# Patient Record
Sex: Female | Born: 1975 | Race: Black or African American | Hispanic: No | Marital: Single | Smoking: Never smoker
Health system: Southern US, Community
[De-identification: ages and names within clinical notes are randomized; demographics above are authoritative.]

## PROBLEM LIST (undated history)

## (undated) ENCOUNTER — Inpatient Hospital Stay (HOSPITAL_COMMUNITY): Payer: Self-pay

## (undated) DIAGNOSIS — Z8742 Personal history of other diseases of the female genital tract: Secondary | ICD-10-CM

## (undated) DIAGNOSIS — F329 Major depressive disorder, single episode, unspecified: Secondary | ICD-10-CM

## (undated) DIAGNOSIS — D259 Leiomyoma of uterus, unspecified: Secondary | ICD-10-CM

## (undated) DIAGNOSIS — K589 Irritable bowel syndrome without diarrhea: Secondary | ICD-10-CM

## (undated) DIAGNOSIS — F419 Anxiety disorder, unspecified: Secondary | ICD-10-CM

## (undated) DIAGNOSIS — R011 Cardiac murmur, unspecified: Secondary | ICD-10-CM

## (undated) DIAGNOSIS — F32A Depression, unspecified: Secondary | ICD-10-CM

## (undated) DIAGNOSIS — K5090 Crohn's disease, unspecified, without complications: Secondary | ICD-10-CM

## (undated) DIAGNOSIS — G8929 Other chronic pain: Secondary | ICD-10-CM

## (undated) DIAGNOSIS — D649 Anemia, unspecified: Secondary | ICD-10-CM

## (undated) DIAGNOSIS — G589 Mononeuropathy, unspecified: Secondary | ICD-10-CM

## (undated) DIAGNOSIS — M549 Dorsalgia, unspecified: Secondary | ICD-10-CM

## (undated) DIAGNOSIS — L309 Dermatitis, unspecified: Secondary | ICD-10-CM

## (undated) DIAGNOSIS — S149XXA Injury of unspecified nerves of neck, initial encounter: Secondary | ICD-10-CM

## (undated) DIAGNOSIS — R519 Headache, unspecified: Secondary | ICD-10-CM

## (undated) DIAGNOSIS — M1990 Unspecified osteoarthritis, unspecified site: Secondary | ICD-10-CM

## (undated) DIAGNOSIS — J45909 Unspecified asthma, uncomplicated: Secondary | ICD-10-CM

## (undated) DIAGNOSIS — Z87442 Personal history of urinary calculi: Secondary | ICD-10-CM

## (undated) DIAGNOSIS — K219 Gastro-esophageal reflux disease without esophagitis: Secondary | ICD-10-CM

## (undated) DIAGNOSIS — R51 Headache: Secondary | ICD-10-CM

## (undated) DIAGNOSIS — I10 Essential (primary) hypertension: Secondary | ICD-10-CM

## (undated) HISTORY — PX: LEEP: SHX91

## (undated) HISTORY — PX: OTHER SURGICAL HISTORY: SHX169

## (undated) HISTORY — PX: BUNIONECTOMY: SHX129

## (undated) HISTORY — PX: COLONOSCOPY: SHX174

## (undated) HISTORY — PX: MENISCUS REPAIR: SHX5179

## (undated) HISTORY — PX: SHOULDER SURGERY: SHX246

## (undated) HISTORY — DX: Essential (primary) hypertension: I10

## (undated) HISTORY — PX: ABDOMINAL HYSTERECTOMY: SHX81

---

## 1997-11-24 ENCOUNTER — Encounter: Admission: RE | Admit: 1997-11-24 | Discharge: 1997-11-24 | Payer: Self-pay | Admitting: Obstetrics & Gynecology

## 1997-11-24 ENCOUNTER — Other Ambulatory Visit: Admission: RE | Admit: 1997-11-24 | Discharge: 1997-11-24 | Payer: Self-pay | Admitting: Obstetrics

## 2001-07-04 ENCOUNTER — Encounter: Admission: RE | Admit: 2001-07-04 | Discharge: 2001-07-04 | Payer: Self-pay | Admitting: *Deleted

## 2001-07-16 ENCOUNTER — Encounter: Admission: RE | Admit: 2001-07-16 | Discharge: 2001-07-16 | Payer: Self-pay | Admitting: *Deleted

## 2001-07-16 ENCOUNTER — Other Ambulatory Visit: Admission: RE | Admit: 2001-07-16 | Discharge: 2001-07-16 | Payer: Self-pay | Admitting: Obstetrics and Gynecology

## 2001-12-20 ENCOUNTER — Encounter: Admission: RE | Admit: 2001-12-20 | Discharge: 2001-12-20 | Payer: Self-pay | Admitting: *Deleted

## 2001-12-20 ENCOUNTER — Other Ambulatory Visit: Admission: RE | Admit: 2001-12-20 | Discharge: 2001-12-20 | Payer: Self-pay | Admitting: *Deleted

## 2002-01-05 ENCOUNTER — Emergency Department (HOSPITAL_COMMUNITY): Admission: EM | Admit: 2002-01-05 | Discharge: 2002-01-05 | Payer: Self-pay | Admitting: Emergency Medicine

## 2002-01-07 ENCOUNTER — Encounter: Admission: RE | Admit: 2002-01-07 | Discharge: 2002-01-07 | Payer: Self-pay | Admitting: *Deleted

## 2005-02-15 ENCOUNTER — Inpatient Hospital Stay (HOSPITAL_COMMUNITY): Admission: RE | Admit: 2005-02-15 | Discharge: 2005-02-17 | Payer: Self-pay | Admitting: Obstetrics

## 2005-02-15 ENCOUNTER — Encounter (INDEPENDENT_AMBULATORY_CARE_PROVIDER_SITE_OTHER): Payer: Self-pay | Admitting: Specialist

## 2005-02-15 HISTORY — PX: MYOMECTOMY ABDOMINAL APPROACH: SUR870

## 2005-02-28 ENCOUNTER — Inpatient Hospital Stay (HOSPITAL_COMMUNITY): Admission: AD | Admit: 2005-02-28 | Discharge: 2005-02-28 | Payer: Self-pay | Admitting: Obstetrics

## 2008-02-18 DIAGNOSIS — G8929 Other chronic pain: Secondary | ICD-10-CM | POA: Insufficient documentation

## 2008-12-16 ENCOUNTER — Emergency Department (HOSPITAL_BASED_OUTPATIENT_CLINIC_OR_DEPARTMENT_OTHER): Admission: EM | Admit: 2008-12-16 | Discharge: 2008-12-16 | Payer: Self-pay | Admitting: Emergency Medicine

## 2008-12-16 ENCOUNTER — Ambulatory Visit: Payer: Self-pay | Admitting: Interventional Radiology

## 2009-02-11 ENCOUNTER — Emergency Department (HOSPITAL_BASED_OUTPATIENT_CLINIC_OR_DEPARTMENT_OTHER): Admission: EM | Admit: 2009-02-11 | Discharge: 2009-02-11 | Payer: Self-pay | Admitting: Emergency Medicine

## 2009-03-16 DIAGNOSIS — K589 Irritable bowel syndrome without diarrhea: Secondary | ICD-10-CM | POA: Insufficient documentation

## 2009-12-19 ENCOUNTER — Emergency Department (HOSPITAL_BASED_OUTPATIENT_CLINIC_OR_DEPARTMENT_OTHER): Admission: EM | Admit: 2009-12-19 | Discharge: 2009-12-19 | Payer: Self-pay | Admitting: Emergency Medicine

## 2009-12-20 ENCOUNTER — Ambulatory Visit: Payer: Self-pay | Admitting: Diagnostic Radiology

## 2009-12-20 ENCOUNTER — Emergency Department (HOSPITAL_BASED_OUTPATIENT_CLINIC_OR_DEPARTMENT_OTHER): Admission: EM | Admit: 2009-12-20 | Discharge: 2009-12-20 | Payer: Self-pay | Admitting: Emergency Medicine

## 2010-06-16 HISTORY — PX: ROBOT ASSISTED MYOMECTOMY: SHX5142

## 2010-06-30 LAB — CBC
HCT: 28.3 % — ABNORMAL LOW (ref 36.0–46.0)
HCT: 29.4 % — ABNORMAL LOW (ref 36.0–46.0)
Hemoglobin: 9.2 g/dL — ABNORMAL LOW (ref 12.0–15.0)
Hemoglobin: 9.4 g/dL — ABNORMAL LOW (ref 12.0–15.0)
MCH: 24 pg — ABNORMAL LOW (ref 26.0–34.0)
MCH: 24.3 pg — ABNORMAL LOW (ref 26.0–34.0)
MCHC: 32 g/dL (ref 30.0–36.0)
MCHC: 32.6 g/dL (ref 30.0–36.0)
MCV: 74.5 fL — ABNORMAL LOW (ref 78.0–100.0)
MCV: 75.2 fL — ABNORMAL LOW (ref 78.0–100.0)
Platelets: 294 10*3/uL (ref 150–400)
Platelets: 305 10*3/uL (ref 150–400)
RBC: 3.8 MIL/uL — ABNORMAL LOW (ref 3.87–5.11)
RBC: 3.9 MIL/uL (ref 3.87–5.11)
RDW: 14.5 % (ref 11.5–15.5)
RDW: 14.6 % (ref 11.5–15.5)
WBC: 8.1 10*3/uL (ref 4.0–10.5)
WBC: 9.5 10*3/uL (ref 4.0–10.5)

## 2010-06-30 LAB — DIFFERENTIAL
Basophils Absolute: 0 10*3/uL (ref 0.0–0.1)
Basophils Absolute: 0.1 10*3/uL (ref 0.0–0.1)
Basophils Relative: 0 % (ref 0–1)
Basophils Relative: 1 % (ref 0–1)
Eosinophils Absolute: 0.1 10*3/uL (ref 0.0–0.7)
Eosinophils Absolute: 0.2 10*3/uL (ref 0.0–0.7)
Eosinophils Relative: 1 % (ref 0–5)
Eosinophils Relative: 2 % (ref 0–5)
Lymphocytes Relative: 31 % (ref 12–46)
Lymphocytes Relative: 33 % (ref 12–46)
Lymphs Abs: 2.5 10*3/uL (ref 0.7–4.0)
Lymphs Abs: 3.2 10*3/uL (ref 0.7–4.0)
Monocytes Absolute: 0.7 10*3/uL (ref 0.1–1.0)
Monocytes Absolute: 0.9 10*3/uL (ref 0.1–1.0)
Monocytes Relative: 9 % (ref 3–12)
Monocytes Relative: 9 % (ref 3–12)
Neutro Abs: 4.6 10*3/uL (ref 1.7–7.7)
Neutro Abs: 5.3 10*3/uL (ref 1.7–7.7)
Neutrophils Relative %: 56 % (ref 43–77)
Neutrophils Relative %: 57 % (ref 43–77)

## 2010-06-30 LAB — URINALYSIS, ROUTINE W REFLEX MICROSCOPIC
Bilirubin Urine: NEGATIVE
Bilirubin Urine: NEGATIVE
Glucose, UA: NEGATIVE mg/dL
Glucose, UA: NEGATIVE mg/dL
Hgb urine dipstick: NEGATIVE
Ketones, ur: NEGATIVE mg/dL
Ketones, ur: NEGATIVE mg/dL
Nitrite: NEGATIVE
Nitrite: NEGATIVE
Protein, ur: 30 mg/dL — AB
Protein, ur: NEGATIVE mg/dL
Specific Gravity, Urine: 1.015 (ref 1.005–1.030)
Specific Gravity, Urine: 1.028 (ref 1.005–1.030)
Urobilinogen, UA: 0.2 mg/dL (ref 0.0–1.0)
Urobilinogen, UA: 0.2 mg/dL (ref 0.0–1.0)
pH: 6 (ref 5.0–8.0)
pH: 6.5 (ref 5.0–8.0)

## 2010-06-30 LAB — COMPREHENSIVE METABOLIC PANEL
ALT: 16 U/L (ref 0–35)
AST: 20 U/L (ref 0–37)
Albumin: 3.9 g/dL (ref 3.5–5.2)
Alkaline Phosphatase: 87 U/L (ref 39–117)
BUN: 7 mg/dL (ref 6–23)
CO2: 28 mEq/L (ref 19–32)
Calcium: 8.8 mg/dL (ref 8.4–10.5)
Chloride: 104 mEq/L (ref 96–112)
Creatinine, Ser: 0.8 mg/dL (ref 0.4–1.2)
GFR calc Af Amer: >60 mL/min (ref >60)
GFR calc non Af Amer: >60 mL/min (ref >60)
Glucose, Bld: 96 mg/dL (ref 70–99)
Potassium: 3.3 mEq/L — ABNORMAL LOW (ref 3.5–5.1)
Sodium: 141 mEq/L (ref 135–145)
Total Bilirubin: 0.5 mg/dL (ref 0.3–1.2)
Total Protein: 7.8 g/dL (ref 6.0–8.3)

## 2010-06-30 LAB — URINE MICROSCOPIC-ADD ON

## 2010-06-30 LAB — BASIC METABOLIC PANEL
BUN: 7 mg/dL (ref 6–23)
CO2: 25 mEq/L (ref 19–32)
Calcium: 8.8 mg/dL (ref 8.4–10.5)
Chloride: 104 mEq/L (ref 96–112)
Creatinine, Ser: 0.7 mg/dL (ref 0.4–1.2)
GFR calc Af Amer: >60 mL/min (ref >60)
GFR calc non Af Amer: >60 mL/min (ref >60)
Glucose, Bld: 88 mg/dL (ref 70–99)
Potassium: 3.5 mEq/L (ref 3.5–5.1)
Sodium: 142 mEq/L (ref 135–145)

## 2010-06-30 LAB — WET PREP, GENITAL
Clue Cells Wet Prep HPF POC: NONE SEEN
Trich, Wet Prep: NONE SEEN
Yeast Wet Prep HPF POC: NONE SEEN

## 2010-06-30 LAB — URINE CULTURE
Colony Count: 80000
Culture  Setup Time: 201109060046

## 2010-06-30 LAB — PREGNANCY, URINE
Preg Test, Ur: NEGATIVE
Preg Test, Ur: NEGATIVE

## 2010-06-30 LAB — LIPASE, BLOOD: Lipase: 33 U/L (ref 23–300)

## 2010-06-30 LAB — GC/CHLAMYDIA PROBE AMP, GENITAL
Chlamydia, DNA Probe: NEGATIVE
GC Probe Amp, Genital: NEGATIVE

## 2010-07-21 LAB — URINALYSIS, ROUTINE W REFLEX MICROSCOPIC
Bilirubin Urine: NEGATIVE
Glucose, UA: NEGATIVE mg/dL
Hgb urine dipstick: NEGATIVE
Ketones, ur: NEGATIVE mg/dL
Nitrite: NEGATIVE
Protein, ur: NEGATIVE mg/dL
Specific Gravity, Urine: 1.019 (ref 1.005–1.030)
Urobilinogen, UA: 0.2 mg/dL (ref 0.0–1.0)
pH: 6 (ref 5.0–8.0)

## 2010-07-21 LAB — BASIC METABOLIC PANEL
BUN: 7 mg/dL (ref 6–23)
CO2: 27 mEq/L (ref 19–32)
Calcium: 8.9 mg/dL (ref 8.4–10.5)
Chloride: 103 mEq/L (ref 96–112)
Creatinine, Ser: 0.7 mg/dL (ref 0.4–1.2)
GFR calc Af Amer: >60 mL/min (ref >60)
GFR calc non Af Amer: >60 mL/min (ref >60)
Glucose, Bld: 111 mg/dL — ABNORMAL HIGH (ref 70–99)
Potassium: 3.2 mEq/L — ABNORMAL LOW (ref 3.5–5.1)
Sodium: 140 mEq/L (ref 135–145)

## 2010-07-21 LAB — DIFFERENTIAL
Basophils Absolute: 0 10*3/uL (ref 0.0–0.1)
Basophils Relative: 1 % (ref 0–1)
Eosinophils Absolute: 0.2 10*3/uL (ref 0.0–0.7)
Eosinophils Relative: 3 % (ref 0–5)
Lymphocytes Relative: 37 % (ref 12–46)
Lymphs Abs: 2.6 10*3/uL (ref 0.7–4.0)
Monocytes Absolute: 0.5 10*3/uL (ref 0.1–1.0)
Monocytes Relative: 7 % (ref 3–12)
Neutro Abs: 3.7 10*3/uL (ref 1.7–7.7)
Neutrophils Relative %: 52 % (ref 43–77)

## 2010-07-21 LAB — CBC
HCT: 34.2 % — ABNORMAL LOW (ref 36.0–46.0)
Hemoglobin: 11.3 g/dL — ABNORMAL LOW (ref 12.0–15.0)
MCHC: 33 g/dL (ref 30.0–36.0)
MCV: 80.5 fL (ref 78.0–100.0)
Platelets: 293 10*3/uL (ref 150–400)
RBC: 4.25 MIL/uL (ref 3.87–5.11)
RDW: 14.1 % (ref 11.5–15.5)
WBC: 7 10*3/uL (ref 4.0–10.5)

## 2010-07-21 LAB — PREGNANCY, URINE: Preg Test, Ur: NEGATIVE

## 2010-07-21 LAB — GC/CHLAMYDIA PROBE AMP, GENITAL: GC Probe Amp, Genital: NEGATIVE

## 2010-07-22 LAB — CBC
HCT: 34.4 % — ABNORMAL LOW (ref 36.0–46.0)
Hemoglobin: 11.7 g/dL — ABNORMAL LOW (ref 12.0–15.0)
MCV: 79.8 fL (ref 78.0–100.0)
RBC: 4.31 MIL/uL (ref 3.87–5.11)
WBC: 9.4 10*3/uL (ref 4.0–10.5)

## 2010-09-02 NOTE — Op Note (Signed)
NAME:  Colleen Baker, SCALF NO.:  [id number]   MEDICAL RECORD NO.:  [id number]          PATIENT TYPE:  INP   LOCATION:  9399                          FACILITY:  [facility]   PHYSICIAN:  Kathreen Cosier, M.D.DATE OF BIRTH:  [birth year]   DATE OF PROCEDURE:  02/15/2005  DATE OF DISCHARGE:                                 OPERATIVE REPORT   PREOPERATIVE DIAGNOSIS:  Myoma uteri.   OPERATION/PROCEDURE:  Multiple myomectomies.   SURGEON:  Kathreen Cosier, M.D.   FIRST ASSISTANT:  Charles A. Clearance Coots, M.D.   DESCRIPTION OF PROCEDURE:  The patient was placed in the supine position.  General anesthesia was administered.  The abdomen prepped and draped.  Bladder emptied with the Foley catheter.  A transverse suprapubic incision  made and carried down to the rectus fascia. The fascia was cleaned and  incised the length of the incision.  Rectus muscles were retracted  laterally.  Peritoneum was incised longitudinally.  The uterus was delivered  from the abdominal cavity.  She had 20-week size uterus with multiple  myomas. There was a large fundal myoma, two large posterior ones and one  anterior myoma.  Incision was made across the top of the fundus from the top  of the bladder posteriorly.  Then using blunt and sharp dissection, her  myomas were removed through that incision.  She had one large fundal 10 cm  myoma and posteriorly four smaller myomas removed.  The excess tissue was  closed with interrupted sutures of #1 chromic closing on the dead space and  the tubes and ovaries appeared to be normal and the cavity of the uterus was  dilated.  Hemostasis was achieved using interrupted sutures of #1 chromic  across the top of the uterus.  There was an anterior lower segment myoma  which was removed in the uterine manner and hemostasis was achieved with  interrupted sutures of #1 chromic.  Interceed was then placed over the  uterus where any incisions had been made.  The blood loss  was 600 mL.  The  abdomen was closed in layers.  Peritoneum with continuous suture of 0  chromic, fascia with continuous suture of 0 Dexon and skin closed with  subcuticular stitch of 4-0 Monocryl.  Blood loss 600 mL.  The patient  tolerated the procedure well and was taken to the recovery room in good  condition.           ______________________________  Kathreen Cosier, M.D.     BAM/MEDQ  D:  02/15/2005  T:  02/15/2005  Job:  [id number]

## 2010-09-02 NOTE — Discharge Summary (Signed)
NAME:  MINIE, ROADCAP NO.:  [id number]   MEDICAL RECORD NO.:  [id number]          PATIENT TYPE:  INP   LOCATION:  9317                          FACILITY:  [facility]   PHYSICIAN:  Kathreen Cosier, M.D.DATE OF BIRTH:  February 03, 1976   DATE OF ADMISSION:  02/15/2005  DATE OF DISCHARGE:  02/17/2005                                 DISCHARGE SUMMARY   HISTORY AND HOSPITAL COURSE:  The patient is a 35 year old gravida 1, para 0  female with 20 week size myoma's and anemia secondary to hypermenorrhea.  She was admitted on February 15, 2005 and had multiple myomectomy.  Blood  loss was approximately 600 cc.  On admission her hemoglobin was 10.5, white  count 6.4.  Pro Time and PTT normal.  Sodium 141, potassium 3.8, chloride  101.  Urinalysis was negative.  She was O positive.  Postoperatively her  hemoglobin was 6.1.  she was asymptomatic and started on ferrous sulfate 325  p.o. daily.  The patient did well and was discharged on the second  postoperative day, ambulatory and on a regular diet.  She is to see me in  follow up in two weeks.   DISCHARGE MEDICATIONS:  1.  Tylox one to two every three to four hours PRN.  2.  Ferrous sulfate 325 mg p.o. daily.           ______________________________  Kathreen Cosier, M.D.     BAM/MEDQ  D:  02/17/2005  T:  02/17/2005  Job:  [id number]

## 2011-02-27 LAB — OB RESULTS CONSOLE RPR: RPR: NONREACTIVE

## 2011-02-27 LAB — OB RESULTS CONSOLE HIV ANTIBODY (ROUTINE TESTING): HIV: NONREACTIVE

## 2011-02-27 LAB — OB RESULTS CONSOLE ABO/RH: RH Type: POSITIVE

## 2011-08-13 ENCOUNTER — Encounter (HOSPITAL_COMMUNITY): Payer: Self-pay

## 2011-08-15 ENCOUNTER — Other Ambulatory Visit: Payer: Self-pay | Admitting: Obstetrics and Gynecology

## 2011-08-18 ENCOUNTER — Encounter (HOSPITAL_COMMUNITY): Payer: Self-pay | Admitting: *Deleted

## 2011-08-18 ENCOUNTER — Inpatient Hospital Stay (HOSPITAL_COMMUNITY)
Admission: AD | Admit: 2011-08-18 | Discharge: 2011-08-18 | Disposition: A | Discharge status: Home / Self Care | Payer: Managed Care, Other (non HMO) | Source: Ambulatory Visit | Attending: Obstetrics and Gynecology | Admitting: Obstetrics and Gynecology

## 2011-08-18 DIAGNOSIS — O99891 Other specified diseases and conditions complicating pregnancy: Secondary | ICD-10-CM | POA: Insufficient documentation

## 2011-08-18 DIAGNOSIS — R030 Elevated blood-pressure reading, without diagnosis of hypertension: Secondary | ICD-10-CM | POA: Insufficient documentation

## 2011-08-18 HISTORY — DX: Anemia, unspecified: D64.9

## 2011-08-18 HISTORY — DX: Irritable bowel syndrome, unspecified: K58.9

## 2011-08-18 LAB — URINALYSIS, ROUTINE W REFLEX MICROSCOPIC
Bilirubin Urine: NEGATIVE
Glucose, UA: NEGATIVE mg/dL
Hgb urine dipstick: NEGATIVE
Ketones, ur: NEGATIVE mg/dL
Leukocytes, UA: NEGATIVE
Nitrite: NEGATIVE
Protein, ur: NEGATIVE mg/dL
Specific Gravity, Urine: 1.02 (ref 1.005–1.030)
Urobilinogen, UA: 0.2 mg/dL (ref 0.0–1.0)
pH: 6.5 (ref 5.0–8.0)

## 2011-08-18 LAB — COMPREHENSIVE METABOLIC PANEL
ALT: 11 U/L (ref 0–35)
AST: 15 U/L (ref 0–37)
Albumin: 2.1 g/dL — ABNORMAL LOW (ref 3.5–5.2)
Alkaline Phosphatase: 116 U/L (ref 39–117)
BUN: 6 mg/dL (ref 6–23)
CO2: 20 mEq/L (ref 19–32)
Calcium: 8.9 mg/dL (ref 8.4–10.5)
Chloride: 106 mEq/L (ref 96–112)
Creatinine, Ser: 0.63 mg/dL (ref 0.50–1.10)
GFR calc Af Amer: >90 mL/min (ref >90)
GFR calc non Af Amer: >90 mL/min (ref >90)
Glucose, Bld: 115 mg/dL — ABNORMAL HIGH (ref 70–99)
Potassium: 3.8 mEq/L (ref 3.5–5.1)
Sodium: 136 mEq/L (ref 135–145)
Total Bilirubin: 0.2 mg/dL — ABNORMAL LOW (ref 0.3–1.2)
Total Protein: 5.8 g/dL — ABNORMAL LOW (ref 6.0–8.3)

## 2011-08-18 LAB — CBC
Hemoglobin: 10.8 g/dL — ABNORMAL LOW (ref 12.0–15.0)
MCH: 26.9 pg (ref 26.0–34.0)
MCHC: 33.2 g/dL (ref 30.0–36.0)

## 2011-08-18 LAB — URIC ACID: Uric Acid, Serum: 4.2 mg/dL (ref 2.4–7.0)

## 2011-08-18 MED ORDER — LABETALOL HCL 100 MG PO TABS
100.0000 mg | ORAL_TABLET | Freq: Once | ORAL | Status: AC
  Administered 2011-08-18: 100 mg via ORAL
  Filled 2011-08-18: qty 1

## 2011-08-18 NOTE — Discharge Instructions (Signed)
Hypertension During Pregnancy Hypertension is also called high blood pressure. It can occur at any time in life and during pregnancy. When you have hypertension, there is extra pressure inside your blood vessels that carry blood from the heart to the rest of your body (arteries). Hypertension during pregnancy can cause problems for you and your baby. Your baby might not weigh as much as it should at birth or might be born early (premature). Very bad cases of hypertension during pregnancy can be life-threatening.  There are different types of hypertension during pregnancy.   Chronic hypertension. This happens when a woman has hypertension before pregnancy and it continues during pregnancy.   Gestational hypertension. This is when hypertension develops during pregnancy.   Preeclampsia or toxemia of pregnancy. This is a very serious type of hypertension that develops only during pregnancy. It is a disease that affects the whole body (systemic) and can be very dangerous for both mother and baby.   Gestational hypertension and preeclampsia usually go away after your baby is born. Blood pressure generally stabilizes within 6 weeks. Women who have hypertension during pregnancy have a greater chance of developing hypertension later in life or with future pregnancies. UNDERSTANDING BLOOD PRESSURE Blood pressure moves blood in your body. Sometimes, the force that moves the blood becomes too strong.  A blood pressure reading is given in 2 numbers and looks like a fraction.   The top number is called the systolic pressure. When your heart beats, it forces more blood to flow through the arteries. Pressure inside the arteries goes up.   The bottom number is the diastolic pressure. Pressure goes down between beats. That is when the heart is resting.   You may have hypertension if:   Your systolic blood pressure is above 140.   Your diastolic pressure is above 90.  RISK FACTORS Some factors make you more  likely to develop hypertension during pregnancy. Risk factors include:  Having hypertension before pregnancy.   Having hypertension during a previous pregnancy.   Being overweight.   Being older than 40.   Being pregnant with more than 1 baby (multiples).   Having diabetes or kidney problems.  SYMPTOMS Chronic and gestational hypertension may not cause symptoms. Preeclampsia has symptoms, which may include:  Increased protein in your urine. Your caregiver will check for this at every prenatal visit.   Swelling of your hands and face.   Rapid weight gain.   Headaches.   Visual changes.   Being bothered by light.   Abdominal pain, especially in the right upper area.   Chest pain.   Shortness of breath.   Increased reflexes.   Seizures. Seizures occur with a more severe form of preeclampsia, called eclampsia.  DIAGNOSIS   You may be diagnosed with hypertension during pregnancy during a regular prenatal exam. At each visit, tests may include:   Blood pressure checks.   A urine test to check for protein in your urine.   The type of hypertension you are diagnosed with depends on when you developed it. It also depends on your specific blood pressure reading.   Developing hypertension before 20 weeks of pregnancy is consistent with chronic hypertension.   Developing hypertension after 20 weeks of pregnancy is consistent with gestational hypertension.   Hypertension with increased urinary protein is diagnosed as preeclampsia.   Blood pressure measurements that stay above 160 systolic or 110 diastolic are a sign of severe preeclampsia.  TREATMENT Treatment for hypertension during pregnancy varies. Treatment depends on   the type of hypertension and how serious it is.  If you take medicine for chronic hypertension, you may need to switch medicines.   Drugs called ACE inhibitors should not be taken during pregnancy.   Low-dose aspirin may be suggested for women who have  risk factors for preeclampsia.   If you have gestational hypertension, you may need to take a blood pressure medicine that is safe during pregnancy. Your caregiver will recommend the appropriate medicine.   If you have severe preeclampsia, you may need to be in the hospital. Caregivers will watch you and the baby very closely. You also may need to take medicine (magnesium sulfate) to prevent seizures and lower blood pressure.   Sometimes an early delivery is needed. This may be the case if the condition worsens. It would be done to protect you and the baby. The only cure for preeclampsia is delivery.  HOME CARE INSTRUCTIONS  Schedule and keep all of your regular prenatal care.   Follow your caregiver's instructions for taking medicines. Tell your caregiver about all medicines you take. This includes over-the-counter medicines.   Eat as little salt as possible.   Get regular exercise.   Do not drink alcohol.   Do not use tobacco products.   Do not drink products with caffeine.   Lie on your left side when resting.   Tell your doctor if you have any preeclampsia symptoms.  SEEK IMMEDIATE MEDICAL CARE IF:  You have severe abdominal pain.   You have sudden swelling in the hands, ankles, or face.   You gain 4 pounds (1.8 kg) or more in 1 week.   You vomit repeatedly.   You have vaginal bleeding.   You do not feel the baby moving as much.   You have a headache.   You have blurred or double vision.   You have muscle twitching or spasms.   You have shortness of breath.   You have blue fingernails and lips.   You have blood in your urine.  MAKE SURE YOU:  Understand these instructions.   Will watch your condition.   Will get help right away if you are not doing well.  Document Released: 12/20/2010 Document Revised: 03/23/2011 Document Reviewed: 12/20/2010 [employer] Patient Information 2012 [employer].24-Hour Urine Collection HOME CARE  When you get up in the  morning on the day you do this test, pee (urinate) in the toilet and flush. Make a note of the time. This will be your start time on the day of collection and the end time on the next morning.   From then on, save all your pee (urine) in the plastic jug that was given to you.   You should stop collecting your pee 24 hours after you started.   If the plastic jug that is given to you already has liquid in it, that is okay. Do not throw out the liquid or rinse out the jug. Some tests need the liquid to be added to your pee.   Keep your plastic jug cool (in an ice chest or the refrigerator) during the test.   When the 24 hours is over, bring your plastic jug to the clinic lab. Keep the jug cool (in an ice chest) while you are bringing it to the lab.  Document Released: 06/30/2008 Document Revised: 03/23/2011 Document Reviewed: 06/30/2008 [employer] Patient Information 2012 [employer]. 

## 2011-08-18 NOTE — MAU Note (Signed)
Labetalol 200mg  BID po called to [facility] and [place]. [phone]. No refills. Pt understands to take as directed

## 2011-08-18 NOTE — MAU Note (Signed)
Sent from office today with elevated BP

## 2011-08-18 NOTE — Progress Notes (Signed)
Written and verbal d/c instructions given and understanding voiced. Understands how to obtain 24hr urine and to return Sat pm. To keep urine on ice. To start Labetalol tomorrow am as directed and cont. Aldomet. Call office Monday for appt.

## 2011-08-18 NOTE — Progress Notes (Signed)
Dr Henderson Cloud notified of pt's B/Ps after taking Labetalol. Pt stable for d/c home

## 2011-08-18 NOTE — H&P (Signed)
36 y.o. G2P0 at 18 3/7 comes in c/o elevated blood pressures in the office.  She was in office for a regular visit and NST for chronic hypertension.  Her BP in the office was 180/100 and 170/100.  She otherwise has good fetal movement and no bleeding.  No past medical history on file. No past surgical history on file.  OB History    No data available      History   Social History  . Marital Status: Single    Spouse Name: N/A    Number of Children: N/A  . Years of Education: N/A   Occupational History  . Not on file.   Social History Main Topics  . Smoking status: Not on file  . Smokeless tobacco: Not on file  . Alcohol Use: Not on file  . Drug Use: Not on file  . Sexually Active: Not on file   Other Topics Concern  . Not on file   Social History Narrative  . No narrative on file   Aciphex   Prenatal Course:  Late gestation transfer from [place].  CHTN has been controlled with Aldomet TID.  She has no diabetes and is scheduled for a C/S on 5-16 secondary myomectomies times two.  There were no vitals filed for this visit.   Lungs/Cor:  NAD Abdomen:  soft, gravid Ex:  no cords, erythema SVE:  deferred FHTs: P- just arrived. Toco:  P   A/P   Chronic HTN at term.  Hx myomectomies, needs C/S.  R/O superimposed preeclampsia with PIH labs and urine.  GBS neg.  Give dose of labetalol 100 mg now.    Colleen Baker A

## 2011-08-18 NOTE — Progress Notes (Signed)
States h/a is not as bad as earlier today

## 2011-08-20 ENCOUNTER — Inpatient Hospital Stay (HOSPITAL_COMMUNITY)
Admission: AD | Admit: 2011-08-20 | Discharge: 2011-08-20 | Disposition: A | Discharge status: Home / Self Care | Payer: Managed Care, Other (non HMO) | Source: Ambulatory Visit | Attending: Obstetrics and Gynecology | Admitting: Obstetrics and Gynecology

## 2011-08-20 DIAGNOSIS — O99891 Other specified diseases and conditions complicating pregnancy: Secondary | ICD-10-CM | POA: Insufficient documentation

## 2011-08-20 DIAGNOSIS — R030 Elevated blood-pressure reading, without diagnosis of hypertension: Secondary | ICD-10-CM | POA: Insufficient documentation

## 2011-08-20 NOTE — Progress Notes (Signed)
Notified Dr. Henderson Cloud of b/p. Pt just here for b/p check and drop off urine. Order to let pt go home.

## 2011-08-20 NOTE — MAU Note (Signed)
Pt here for b/p check and drop off urine specimine

## 2011-08-28 ENCOUNTER — Encounter (HOSPITAL_COMMUNITY)
Admission: RE | Admit: 2011-08-28 | Discharge: 2011-08-28 | Disposition: A | Discharge status: Home / Self Care | Payer: Managed Care, Other (non HMO) | Source: Ambulatory Visit | Attending: Obstetrics & Gynecology | Admitting: Obstetrics & Gynecology

## 2011-08-28 ENCOUNTER — Encounter (HOSPITAL_COMMUNITY): Payer: Self-pay

## 2011-08-28 LAB — COMPREHENSIVE METABOLIC PANEL
ALT: 14 U/L (ref 0–35)
AST: 16 U/L (ref 0–37)
Albumin: 2.3 g/dL — ABNORMAL LOW (ref 3.5–5.2)
Alkaline Phosphatase: 129 U/L — ABNORMAL HIGH (ref 39–117)
BUN: 6 mg/dL (ref 6–23)
Potassium: 3.7 mEq/L (ref 3.5–5.1)
Sodium: 137 mEq/L (ref 135–145)
Total Protein: 6.2 g/dL (ref 6.0–8.3)

## 2011-08-28 LAB — CBC
MCHC: 32.5 g/dL (ref 30.0–36.0)
Platelets: 138 10*3/uL — ABNORMAL LOW (ref 150–400)
RDW: 14.9 % (ref 11.5–15.5)

## 2011-08-28 LAB — SURGICAL PCR SCREEN
MRSA, PCR: NEGATIVE
Staphylococcus aureus: NEGATIVE

## 2011-08-28 LAB — RPR: RPR Ser Ql: NONREACTIVE

## 2011-08-28 NOTE — Patient Instructions (Addendum)
YOUR PROCEDURE IS SCHEDULED ON:08/31/11  ENTER THROUGH THE MAIN ENTRANCE OF [facility] AT:10am  USE DESK PHONE AND DIAL [id number] TO INFORM us OF YOUR ARRIVAL  CALL [phone] IF YOU HAVE ANY QUESTIONS OR PROBLEMS PRIOR TO YOUR ARRIVAL.  REMEMBER: DO NOT EAT  AFTER MIDNIGHT :Wed  SPECIAL INSTRUCTIONS:clear liquids ok until 730 am  YOU MAY BRUSH YOUR TEETH THE MORNING OF SURGERY   TAKE THESE MEDICINES THE DAY OF SURGERY WITH SIP OF WATER:Methyldopa, and Labetalol, use Pulmocort, bring emergency inhaler to hospital   DO NOT WEAR JEWELRY, EYE MAKEUP, LIPSTICK OR DARK FINGERNAIL POLISH DO NOT WEAR LOTIONS  DO NOT SHAVE FOR 48 HOURS PRIOR TO SURGERY  YOU WILL NOT BE ALLOWED TO DRIVE YOURSELF HOME.  NAME OF DRIVER:Robert Jari Pigg

## 2011-08-29 ENCOUNTER — Encounter (HOSPITAL_COMMUNITY): Payer: Self-pay | Admitting: *Deleted

## 2011-08-29 ENCOUNTER — Telehealth (HOSPITAL_COMMUNITY): Payer: Self-pay | Admitting: *Deleted

## 2011-08-29 NOTE — Telephone Encounter (Signed)
Preadmission screen  

## 2011-08-30 MED ORDER — CEFAZOLIN SODIUM-DEXTROSE 2-3 GM-% IV SOLR
2.0000 g | INTRAVENOUS | Status: DC
  Filled 2011-08-30: qty 50

## 2011-08-31 ENCOUNTER — Encounter (HOSPITAL_COMMUNITY)
Admission: RE | Disposition: A | Discharge status: Home / Self Care | Payer: Self-pay | Source: Ambulatory Visit | Attending: Obstetrics and Gynecology

## 2011-08-31 ENCOUNTER — Encounter (HOSPITAL_COMMUNITY): Payer: Self-pay

## 2011-08-31 ENCOUNTER — Encounter (HOSPITAL_COMMUNITY): Payer: Self-pay | Admitting: *Deleted

## 2011-08-31 ENCOUNTER — Inpatient Hospital Stay (HOSPITAL_COMMUNITY): Payer: Managed Care, Other (non HMO)

## 2011-08-31 ENCOUNTER — Inpatient Hospital Stay (HOSPITAL_COMMUNITY)
Admission: RE | Admit: 2011-08-31 | Discharge: 2011-09-02 | DRG: 766 | Disposition: A | Discharge status: Home / Self Care | Payer: Managed Care, Other (non HMO) | Source: Ambulatory Visit | Attending: Obstetrics and Gynecology | Admitting: Obstetrics and Gynecology

## 2011-08-31 DIAGNOSIS — O1002 Pre-existing essential hypertension complicating childbirth: Principal | ICD-10-CM | POA: Diagnosis present

## 2011-08-31 DIAGNOSIS — D649 Anemia, unspecified: Secondary | ICD-10-CM | POA: Diagnosis present

## 2011-08-31 DIAGNOSIS — O3490 Maternal care for abnormality of pelvic organ, unspecified, unspecified trimester: Secondary | ICD-10-CM | POA: Diagnosis present

## 2011-08-31 DIAGNOSIS — O09529 Supervision of elderly multigravida, unspecified trimester: Secondary | ICD-10-CM | POA: Diagnosis present

## 2011-08-31 DIAGNOSIS — N9489 Other specified conditions associated with female genital organs and menstrual cycle: Secondary | ICD-10-CM | POA: Diagnosis present

## 2011-08-31 DIAGNOSIS — O9902 Anemia complicating childbirth: Secondary | ICD-10-CM | POA: Diagnosis present

## 2011-08-31 DIAGNOSIS — O10919 Unspecified pre-existing hypertension complicating pregnancy, unspecified trimester: Secondary | ICD-10-CM

## 2011-08-31 LAB — CBC
HCT: 33.1 % — ABNORMAL LOW (ref 36.0–46.0)
Hemoglobin: 10.7 g/dL — ABNORMAL LOW (ref 12.0–15.0)
RDW: 14.6 % (ref 11.5–15.5)
WBC: 8.5 10*3/uL (ref 4.0–10.5)

## 2011-08-31 LAB — COMPREHENSIVE METABOLIC PANEL
Alkaline Phosphatase: 130 U/L — ABNORMAL HIGH (ref 39–117)
CO2: 19 mEq/L (ref 19–32)
Calcium: 9 mg/dL (ref 8.4–10.5)
Chloride: 104 mEq/L (ref 96–112)
GFR calc Af Amer: >90 mL/min (ref >90)
Potassium: 3.6 mEq/L (ref 3.5–5.1)
Sodium: 134 mEq/L — ABNORMAL LOW (ref 135–145)
Total Protein: 6.1 g/dL (ref 6.0–8.3)

## 2011-08-31 LAB — DIFFERENTIAL
Basophils Absolute: 0 10*3/uL (ref 0.0–0.1)
Lymphocytes Relative: 19 % (ref 12–46)
Monocytes Absolute: 0.6 10*3/uL (ref 0.1–1.0)
Neutro Abs: 6.2 10*3/uL (ref 1.7–7.7)
Neutrophils Relative %: 73 % (ref 43–77)

## 2011-08-31 LAB — LACTATE DEHYDROGENASE: LDH: 210 U/L (ref 94–250)

## 2011-08-31 SURGERY — Surgical Case
Anesthesia: Spinal

## 2011-08-31 MED ORDER — LACTATED RINGERS IV SOLN
INTRAVENOUS | Status: DC
  Administered 2011-08-31: 11:00:00 via INTRAVENOUS

## 2011-08-31 MED ORDER — FENTANYL CITRATE 0.05 MG/ML IJ SOLN: 25.0000 ug | INTRAMUSCULAR | Status: DC | PRN

## 2011-08-31 MED ORDER — NALOXONE HCL 0.4 MG/ML IJ SOLN: 0.4000 mg | INTRAMUSCULAR | Status: DC | PRN

## 2011-08-31 MED ORDER — DIPHENHYDRAMINE HCL 50 MG/ML IJ SOLN: 12.5000 mg | INTRAMUSCULAR | Status: DC | PRN

## 2011-08-31 MED ORDER — FLUTICASONE PROPIONATE HFA 44 MCG/ACT IN AERO
2.0000 | INHALATION_SPRAY | Freq: Two times a day (BID) | RESPIRATORY_TRACT | Status: DC
  Administered 2011-08-31 – 2011-09-02 (×4): 2 via RESPIRATORY_TRACT
  Filled 2011-08-31: qty 10.6

## 2011-08-31 MED ORDER — ONDANSETRON HCL 4 MG/2ML IJ SOLN
INTRAMUSCULAR | Status: AC
  Filled 2011-08-31: qty 2

## 2011-08-31 MED ORDER — PHENYLEPHRINE 40 MCG/ML (10ML) SYRINGE FOR IV PUSH (FOR BLOOD PRESSURE SUPPORT)
PREFILLED_SYRINGE | INTRAVENOUS | Status: AC
  Filled 2011-08-31: qty 5

## 2011-08-31 MED ORDER — PHENYLEPHRINE HCL 10 MG/ML IJ SOLN
INTRAMUSCULAR | Status: DC | PRN
  Administered 2011-08-31: 80 ug via INTRAVENOUS
  Administered 2011-08-31: 40 ug via INTRAVENOUS
  Administered 2011-08-31: 80 ug via INTRAVENOUS
  Administered 2011-08-31: 40 ug via INTRAVENOUS

## 2011-08-31 MED ORDER — FENTANYL CITRATE 0.05 MG/ML IJ SOLN
INTRAMUSCULAR | Status: DC | PRN
  Administered 2011-08-31: 25 ug via INTRATHECAL

## 2011-08-31 MED ORDER — IBUPROFEN 600 MG PO TABS
600.0000 mg | ORAL_TABLET | Freq: Four times a day (QID) | ORAL | Status: DC | PRN
  Filled 2011-08-31 (×4): qty 1

## 2011-08-31 MED ORDER — SCOPOLAMINE 1 MG/3DAYS TD PT72
1.0000 | MEDICATED_PATCH | Freq: Once | TRANSDERMAL | Status: DC
  Filled 2011-08-31: qty 1

## 2011-08-31 MED ORDER — SIMETHICONE 80 MG PO CHEW: 80.0000 mg | CHEWABLE_TABLET | ORAL | Status: DC | PRN

## 2011-08-31 MED ORDER — MORPHINE SULFATE (PF) 0.5 MG/ML IJ SOLN
INTRAMUSCULAR | Status: DC | PRN
  Administered 2011-08-31: .1 mg via INTRATHECAL

## 2011-08-31 MED ORDER — METOCLOPRAMIDE HCL 5 MG/ML IJ SOLN: 10.0000 mg | Freq: Three times a day (TID) | INTRAMUSCULAR | Status: DC | PRN

## 2011-08-31 MED ORDER — DIPHENHYDRAMINE HCL 25 MG PO CAPS: 25.0000 mg | ORAL_CAPSULE | ORAL | Status: DC | PRN

## 2011-08-31 MED ORDER — MORPHINE SULFATE 0.5 MG/ML IJ SOLN
INTRAMUSCULAR | Status: AC
  Filled 2011-08-31: qty 10

## 2011-08-31 MED ORDER — FENTANYL CITRATE 0.05 MG/ML IJ SOLN
INTRAMUSCULAR | Status: AC
  Filled 2011-08-31: qty 2

## 2011-08-31 MED ORDER — NALOXONE HCL 0.4 MG/ML IJ SOLN
1.0000 ug/kg/h | INTRAMUSCULAR | Status: DC | PRN
  Filled 2011-08-31: qty 2.5

## 2011-08-31 MED ORDER — ONDANSETRON HCL 4 MG/2ML IJ SOLN: 4.0000 mg | Freq: Three times a day (TID) | INTRAMUSCULAR | Status: DC | PRN

## 2011-08-31 MED ORDER — WITCH HAZEL-GLYCERIN EX PADS: 1.0000 "application " | MEDICATED_PAD | CUTANEOUS | Status: DC | PRN

## 2011-08-31 MED ORDER — KETOROLAC TROMETHAMINE 30 MG/ML IJ SOLN: 30.0000 mg | Freq: Four times a day (QID) | INTRAMUSCULAR | Status: DC | PRN

## 2011-08-31 MED ORDER — OXYCODONE-ACETAMINOPHEN 5-325 MG PO TABS
1.0000 | ORAL_TABLET | ORAL | Status: DC | PRN
  Administered 2011-08-31 – 2011-09-02 (×5): 1 via ORAL
  Filled 2011-08-31 (×3): qty 1
  Filled 2011-08-31: qty 2
  Filled 2011-08-31: qty 1

## 2011-08-31 MED ORDER — BUPIVACAINE IN DEXTROSE 0.75-8.25 % IT SOLN
INTRATHECAL | Status: DC | PRN
  Administered 2011-08-31: 1.6 mL via INTRATHECAL

## 2011-08-31 MED ORDER — LACTATED RINGERS IV SOLN
INTRAVENOUS | Status: DC
  Administered 2011-08-31: 20:00:00 via INTRAVENOUS

## 2011-08-31 MED ORDER — ONDANSETRON HCL 4 MG PO TABS: 4.0000 mg | ORAL_TABLET | ORAL | Status: DC | PRN

## 2011-08-31 MED ORDER — SIMETHICONE 80 MG PO CHEW
80.0000 mg | CHEWABLE_TABLET | Freq: Three times a day (TID) | ORAL | Status: DC
  Administered 2011-08-31 – 2011-09-02 (×7): 80 mg via ORAL

## 2011-08-31 MED ORDER — OXYTOCIN 20 UNITS IN LACTATED RINGERS INFUSION - SIMPLE: 125.0000 mL/h | INTRAVENOUS | Status: DC

## 2011-08-31 MED ORDER — ONDANSETRON HCL 4 MG/2ML IJ SOLN
INTRAMUSCULAR | Status: DC | PRN
  Administered 2011-08-31: 4 mg via INTRAVENOUS

## 2011-08-31 MED ORDER — OXYTOCIN 10 UNIT/ML IJ SOLN
INTRAMUSCULAR | Status: AC
  Filled 2011-08-31: qty 3

## 2011-08-31 MED ORDER — NALBUPHINE HCL 10 MG/ML IJ SOLN
5.0000 mg | INTRAMUSCULAR | Status: DC | PRN
  Filled 2011-08-31: qty 1

## 2011-08-31 MED ORDER — METHYLDOPA 500 MG PO TABS
750.0000 mg | ORAL_TABLET | Freq: Three times a day (TID) | ORAL | Status: DC
  Administered 2011-08-31 – 2011-09-02 (×7): 750 mg via ORAL
  Filled 2011-08-31 (×9): qty 1

## 2011-08-31 MED ORDER — ONDANSETRON HCL 4 MG/2ML IJ SOLN
4.0000 mg | INTRAMUSCULAR | Status: DC | PRN
  Administered 2011-08-31: 4 mg via INTRAVENOUS
  Filled 2011-08-31: qty 2

## 2011-08-31 MED ORDER — OXYCODONE-ACETAMINOPHEN 5-325 MG PO TABS: 1.0000 | ORAL_TABLET | ORAL | Status: DC | PRN

## 2011-08-31 MED ORDER — LORATADINE 10 MG PO TABS
10.0000 mg | ORAL_TABLET | Freq: Every day | ORAL | Status: DC
  Administered 2011-08-31 – 2011-09-02 (×3): 10 mg via ORAL
  Filled 2011-08-31 (×4): qty 1

## 2011-08-31 MED ORDER — SENNOSIDES-DOCUSATE SODIUM 8.6-50 MG PO TABS
2.0000 | ORAL_TABLET | Freq: Every day | ORAL | Status: DC
  Administered 2011-08-31 – 2011-09-01 (×2): 2 via ORAL

## 2011-08-31 MED ORDER — ZOLPIDEM TARTRATE 5 MG PO TABS: 5.0000 mg | ORAL_TABLET | Freq: Every evening | ORAL | Status: DC | PRN

## 2011-08-31 MED ORDER — LABETALOL HCL 100 MG PO TABS
50.0000 mg | ORAL_TABLET | Freq: Two times a day (BID) | ORAL | Status: DC
  Administered 2011-08-31 – 2011-09-01 (×2): 50 mg via ORAL
  Administered 2011-09-01: 10:00:00 via ORAL
  Administered 2011-09-02: 50 mg via ORAL
  Filled 2011-08-31 (×7): qty 0.5

## 2011-08-31 MED ORDER — 0.9 % SODIUM CHLORIDE (POUR BTL) OPTIME
TOPICAL | Status: DC | PRN
  Administered 2011-08-31: 1000 mL

## 2011-08-31 MED ORDER — OXYTOCIN 10 UNIT/ML IJ SOLN
INTRAMUSCULAR | Status: DC | PRN
  Administered 2011-08-31: 10 [IU]
  Administered 2011-08-31: 20 [IU]

## 2011-08-31 MED ORDER — TETANUS-DIPHTH-ACELL PERTUSSIS 5-2.5-18.5 LF-MCG/0.5 IM SUSP
0.5000 mL | Freq: Once | INTRAMUSCULAR | Status: AC
  Administered 2011-09-01: 0.5 mL via INTRAMUSCULAR
  Filled 2011-08-31: qty 0.5

## 2011-08-31 MED ORDER — SCOPOLAMINE 1 MG/3DAYS TD PT72
1.0000 | MEDICATED_PATCH | Freq: Once | TRANSDERMAL | Status: DC
  Administered 2011-08-31: 1.5 mg via TRANSDERMAL

## 2011-08-31 MED ORDER — MEPERIDINE HCL 25 MG/ML IJ SOLN: 6.2500 mg | INTRAMUSCULAR | Status: DC | PRN

## 2011-08-31 MED ORDER — LANOLIN HYDROUS EX OINT: 1.0000 "application " | TOPICAL_OINTMENT | CUTANEOUS | Status: DC | PRN

## 2011-08-31 MED ORDER — ALBUTEROL SULFATE HFA 108 (90 BASE) MCG/ACT IN AERS
2.0000 | INHALATION_SPRAY | Freq: Four times a day (QID) | RESPIRATORY_TRACT | Status: DC | PRN
  Administered 2011-08-31: 2 via RESPIRATORY_TRACT
  Filled 2011-08-31: qty 6.7

## 2011-08-31 MED ORDER — LACTATED RINGERS IV SOLN
INTRAVENOUS | Status: DC | PRN
  Administered 2011-08-31 (×3): via INTRAVENOUS

## 2011-08-31 MED ORDER — DIPHENHYDRAMINE HCL 50 MG/ML IJ SOLN
25.0000 mg | INTRAMUSCULAR | Status: DC | PRN
Start: 2011-08-31 — End: 2011-09-02

## 2011-08-31 MED ORDER — MENTHOL 3 MG MT LOZG: 1.0000 | LOZENGE | OROMUCOSAL | Status: DC | PRN

## 2011-08-31 MED ORDER — SODIUM CHLORIDE 0.9 % IJ SOLN: 3.0000 mL | INTRAMUSCULAR | Status: DC | PRN

## 2011-08-31 MED ORDER — CEFOTETAN DISODIUM 2 G IJ SOLR
2.0000 g | INTRAMUSCULAR | Status: AC
  Administered 2011-08-31: 2 g via INTRAVENOUS
  Filled 2011-08-31: qty 2

## 2011-08-31 MED ORDER — PRENATAL MULTIVITAMIN CH
1.0000 | ORAL_TABLET | Freq: Every day | ORAL | Status: DC
  Administered 2011-09-01 – 2011-09-02 (×2): 1 via ORAL
  Filled 2011-08-31 (×2): qty 1

## 2011-08-31 MED ORDER — SCOPOLAMINE 1 MG/3DAYS TD PT72
MEDICATED_PATCH | TRANSDERMAL | Status: AC
  Administered 2011-08-31: 1.5 mg via TRANSDERMAL
  Filled 2011-08-31: qty 1

## 2011-08-31 MED ORDER — IBUPROFEN 600 MG PO TABS
600.0000 mg | ORAL_TABLET | Freq: Four times a day (QID) | ORAL | Status: DC
  Administered 2011-09-01 – 2011-09-02 (×7): 600 mg via ORAL
  Filled 2011-08-31 (×3): qty 1

## 2011-08-31 MED ORDER — DIPHENHYDRAMINE HCL 25 MG PO CAPS: 25.0000 mg | ORAL_CAPSULE | Freq: Four times a day (QID) | ORAL | Status: DC | PRN

## 2011-08-31 MED ORDER — DIBUCAINE 1 % RE OINT: 1.0000 "application " | TOPICAL_OINTMENT | RECTAL | Status: DC | PRN

## 2011-08-31 MED ORDER — KETOROLAC TROMETHAMINE 60 MG/2ML IM SOLN
60.0000 mg | Freq: Once | INTRAMUSCULAR | Status: AC | PRN
  Filled 2011-08-31: qty 2

## 2011-08-31 MED ORDER — SCOPOLAMINE 1 MG/3DAYS TD PT72
MEDICATED_PATCH | TRANSDERMAL | Status: AC
  Filled 2011-08-31: qty 1

## 2011-08-31 SURGICAL SUPPLY — 29 items
ADH SKN CLS APL DERMABOND .7 (GAUZE/BANDAGES/DRESSINGS) ×2
CLOTH BEACON ORANGE TIMEOUT ST (SAFETY) ×2 IMPLANT
DERMABOND ADVANCED (GAUZE/BANDAGES/DRESSINGS) ×2
DERMABOND ADVANCED .7 DNX12 (GAUZE/BANDAGES/DRESSINGS) IMPLANT
DRESSING TELFA 8X3 (GAUZE/BANDAGES/DRESSINGS) ×2 IMPLANT
ELECT REM PT RETURN 9FT ADLT (ELECTROSURGICAL) ×2
ELECTRODE REM PT RTRN 9FT ADLT (ELECTROSURGICAL) ×1 IMPLANT
EXTRACTOR VACUUM M CUP 4 TUBE (SUCTIONS) ×1 IMPLANT
GAUZE SPONGE 4X4 12PLY STRL LF (GAUZE/BANDAGES/DRESSINGS) ×4 IMPLANT
GLOVE BIO SURGEON STRL SZ7 (GLOVE) ×4 IMPLANT
GOWN PREVENTION PLUS LG XLONG (DISPOSABLE) ×4 IMPLANT
KIT ABG SYR 3ML LUER SLIP (SYRINGE) IMPLANT
NDL HYPO 25X5/8 SAFETYGLIDE (NEEDLE) IMPLANT
NEEDLE HYPO 25X5/8 SAFETYGLIDE (NEEDLE) IMPLANT
NS IRRIG 1000ML POUR BTL (IV SOLUTION) ×2 IMPLANT
PACK C SECTION WH (CUSTOM PROCEDURE TRAY) ×2 IMPLANT
PAD ABD 7.5X8 STRL (GAUZE/BANDAGES/DRESSINGS) ×2 IMPLANT
RTRCTR C-SECT PINK 25CM LRG (MISCELLANEOUS) IMPLANT
RTRCTR C-SECT PINK 34CM XLRG (MISCELLANEOUS) ×1 IMPLANT
SLEEVE SCD COMPRESS KNEE MED (MISCELLANEOUS) IMPLANT
STAPLER VISISTAT 35W (STAPLE) IMPLANT
SUT CHROMIC 1 CTX 36 (SUTURE) ×4 IMPLANT
SUT PDS AB 0 CTX 60 (SUTURE) ×2 IMPLANT
SUT VIC AB 2-0 CT1 27 (SUTURE) ×2
SUT VIC AB 2-0 CT1 TAPERPNT 27 (SUTURE) ×1 IMPLANT
SUT VIC AB 4-0 KS 27 (SUTURE) IMPLANT
TOWEL OR 17X24 6PK STRL BLUE (TOWEL DISPOSABLE) ×4 IMPLANT
TRAY FOLEY CATH 14FR (SET/KITS/TRAYS/PACK) ×2 IMPLANT
WATER STERILE IRR 1000ML POUR (IV SOLUTION) ×2 IMPLANT

## 2011-08-31 NOTE — Addendum Note (Signed)
Addendum  created 08/31/11 1657 by Graciela Husbands, CRNA   Modules edited:Notes Section

## 2011-08-31 NOTE — Transfer of Care (Signed)
Immediate Anesthesia Transfer of Care Note  Patient: Colleen Baker  Procedure(s) Performed: Procedure(s) (LRB): CESAREAN SECTION (N/A)  Patient Location: PACU  Anesthesia Type: Spinal  Level of Consciousness: awake, alert  and oriented  Airway & Oxygen Therapy: Patient Spontanous Breathing  Post-op Assessment: Post -op Vital signs reviewed and stable  Post vital signs: Reviewed  Complications: No apparent anesthesia complications1

## 2011-08-31 NOTE — Anesthesia Preprocedure Evaluation (Signed)
Anesthesia Evaluation  Patient identified by MRN, date of birth, ID band Patient awake    Reviewed: Allergy & Precautions, H&P , NPO status , Patient's Chart, lab work & pertinent test results, reviewed documented beta blocker date and time   History of Anesthesia Complications Negative for: history of anesthetic complications  Airway Mallampati: I TM Distance: >3 FB Neck ROM: full    Dental  (+) Teeth Intact Full braces:   Pulmonary asthma (allergy - related, rare inhaler use) ,  breath sounds clear to auscultation        Cardiovascular hypertension (CHTN), On Medications and On Home Beta Blockers Rhythm:regular Rate:Normal     Neuro/Psych  Headaches (allergy-related), negative psych ROS   GI/Hepatic Neg liver ROS, IBS (NOT Crohns as listed in computer)   Endo/Other  negative endocrine ROS  Renal/GU negative Renal ROS  negative genitourinary   Musculoskeletal   Abdominal   Peds  Hematology  (+) Blood dyscrasia, anemia ,   Anesthesia Other Findings   Reproductive/Obstetrics (+) Pregnancy (h/o myomectomy surgery x2)                           Anesthesia Physical Anesthesia Plan  ASA: III  Anesthesia Plan: Spinal   Post-op Pain Management:    Induction:   Airway Management Planned:   Additional Equipment:   Intra-op Plan:   Post-operative Plan:   Informed Consent: I have reviewed the patients History and Physical, chart, labs and discussed the procedure including the risks, benefits and alternatives for the proposed anesthesia with the patient or authorized representative who has indicated his/her understanding and acceptance.   Dental Advisory Given  Plan Discussed with: Surgeon and CRNA  Anesthesia Plan Comments:         Anesthesia Quick Evaluation

## 2011-08-31 NOTE — Anesthesia Postprocedure Evaluation (Signed)
Anesthesia Post Note  Patient: Colleen Baker  Procedure(s) Performed: Procedure(s) (LRB): CESAREAN SECTION (N/A)  Anesthesia type: Spinal  Patient location: PACU  Post pain: Pain level controlled  Post assessment: Post-op Vital signs reviewed  Last Vitals:  Filed Vitals:   08/31/11 1345  BP: 122/79  Pulse: 81  Temp: 36.4 C  Resp: 20    Post vital signs: Reviewed  Level of consciousness: awake  Complications: No apparent anesthesia complications

## 2011-08-31 NOTE — Anesthesia Procedure Notes (Signed)
Spinal  Patient location during procedure: OR Start time: 08/31/2011 11:36 AM Staffing Anesthesiologist: Brayton Caves R Performed by: anesthesiologist  Preanesthetic Checklist Completed: patient identified, site marked, surgical consent, pre-op evaluation, timeout performed, IV checked, risks and benefits discussed and monitors and equipment checked Spinal Block Patient position: sitting Prep: DuraPrep Patient monitoring: heart rate, cardiac monitor, continuous pulse ox and blood pressure Approach: midline Location: L3-4 Injection technique: single-shot Needle Needle type: Sprotte  Needle gauge: 24 G Needle length: 9 cm Assessment Sensory level: T4 Additional Notes Patient identified.  Risk benefits discussed including failed block, incomplete pain control, headache, nerve damage, paralysis, blood pressure changes, nausea, vomiting, reactions to medication both toxic or allergic, and postpartum back pain.  Patient expressed understanding and wished to proceed.  All questions were answered.  Sterile technique used throughout procedure and epidural site dressed with sterile barrier dressing. No paresthesia or other complications noted.The patient did not experience any signs of intravascular injection such as tinnitus or metallic taste in mouth nor signs of intrathecal spread such as rapid motor block. Please see nursing notes for vital signs.

## 2011-08-31 NOTE — H&P (Signed)
  CC: Scheduled Cesarean section HPI: 36 yo G2P0010 @ 39+2 presents for a scheduled cesarean section for H/O 2 prior myomectomies. She transferred her obstetric care to [facility] at 33 weeks from [place]. The patients pregnancy has been complicated by chronic hypertension. She has been on home bedrest for the second half of her pregnancy due to this.  She is also AMA. She did not have genetic screening performed during this pregnancy. PMH 1) AMA 2) Anemia 3) CHTN 4) GI disease crohns vs IBS PSH 1) Myomectomy x 2, open x 1, robotic x 1 in 3/12 2) LEEP POB: G2P0010.  EAB x1 PGYN: h/o abnormal pap with LEEP Meds 1) Aldomet 750 tid 2) Iron All: Aciphex PNL: See hollister PE AFVSS Gravid Cvx deferred A/P 1) Admit for C/S 2) Pre-E labs

## 2011-08-31 NOTE — Op Note (Signed)
Pre-Operative Diagnosis: 1) 39+2 week Intrauterine Pregnancy 2) H/O Prior Myomectomy not candidate for trial of labor Postoperative Diagnosis: Same Procedure: Primary Low Transverse Cesarean Section Surgeon: Dr. Waynard Reeds Assistant: Dr. Luvenia Redden Operative Findings: Vigorous female infant in vertex presentation with apgars of 9 & 9.  Normal ovaries and tubes. Hypertrophied uterus that could not be exteriorized. Small peritoneal implants visually consistent with fibroids. Biopsy sent to pathology Specimen: Placenta and peritoneal biopsy EBL: Total I/O In: 2200 [I.V.:2200] Out: 1200 [Urine:400; Blood:800]   Procedure:Colleen Baker is an 36 year old gravida 2 para 0010 at 40 weeks and 2 days estimated gestational age who presents for cesarean section. She has a history of 2 prior myomectomy surgeries and was not a candidate for a trial of labor. Following the appropriate informed consent the patient was brought to the operating room where spinal anesthesia was administered and found to be adequate. She was placed in the dorsal supine position with a leftward tilt. She was prepped and draped in the normal sterile fashion. Scalpel was then used to make a Pfannenstiel skin incision which was carried down to the underlying layers of soft tissue to the fascia. The fascia was incised in the midline and the fascial incision was extended laterally with Mayo scissors. The superior aspect of the fascial incision was grasped with Coker clamps x2, tented up and the rectus muscles dissected off sharply with the electrocautery unit area and the same procedure was repeated on the inferior aspect of the fascial incision. The rectus muscles were separated in the midline. The abdominal peritoneum was identified, tented up, entered sharply, and the incision was extended superiorly and inferiorly with good visualization of the bladder. The Alexis retractor was then deployed. The vesicouterine peritoneum was identified, tented  up, entered sharply, and the bladder flap was created digitally. Scalpel was then used to make a low transverse incision on the uterus which was extended laterally with both blunt dissection and the bandage scissors. The fetal vertex was identified, delivered easily through the uterine incision followed by the body. The infant was bulb suctioned on the operative field cried vigorously, cord was clamped and cut and the infant was passed to the waiting neonatologist. Placenta was then delivered spontaneously, the uterus was cleared of all clot and debris. The uterine incision was repaired with #1 chromic in running locked fashion followed by a second imbricating layer. Ovaries and tubes were inspected and normal. The Alexis retractor was removed. The uterus was returned to the abdominal cavity the abdominal cavity was cleared of all clot and debris. The abdominal peritoneum was reapproximated with 2-0 Vicryl in a running fashion, the rectus muscles was reapproximated with #1 chromic in a running fashion. The fascia was closed with a looped PDS in a running fashion. The skin was closed with 4-0 vicryl in a subcuticular fashion and Dermabond. All sponge lap and needle counts were correct x2. Patient tolerated the procedure well and recovered in stable condition following the procedure.

## 2011-08-31 NOTE — Anesthesia Postprocedure Evaluation (Signed)
  Anesthesia Post-op Note  Patient: Colleen Baker  Procedure(s) Performed: Procedure(s) (LRB): CESAREAN SECTION (N/A)  Patient Location: 108  Anesthesia Type: Spinal  Level of Consciousness: awake, alert  and oriented  Airway and Oxygen Therapy: Patient Spontanous Breathing  Post-op Pain: mild  Post-op Assessment: Post-op Vital signs reviewed, Patient's Cardiovascular Status Stable, No headache, No backache, No residual numbness and No residual motor weakness  Post-op Vital Signs: Reviewed and stable  Complications: No apparent anesthesia complications

## 2011-09-01 LAB — CBC
HCT: 25.6 % — ABNORMAL LOW (ref 36.0–46.0)
Hemoglobin: 8.4 g/dL — ABNORMAL LOW (ref 12.0–15.0)
MCH: 26.6 pg (ref 26.0–34.0)
MCHC: 32.8 g/dL (ref 30.0–36.0)
RDW: 14.6 % (ref 11.5–15.5)

## 2011-09-01 NOTE — Progress Notes (Signed)
Patient ID: Colleen Baker, female   DOB: 1976-03-03, 36 y.o.   MRN: [id number]  POD#1 S/P Primary C/S for h/o myomectomy x 2  S: Doing well, pain controlled. Tolerating PO.  No flatus yet but no N/V O:  Filed Vitals:   09/01/11 0050 09/01/11 0450 09/01/11 0900 09/01/11 1012  BP: 148/92 129/85  140/90  Pulse: 84 82  81  Temp: 98.1 F (36.7 C) 97.7 F (36.5 C)    TempSrc: Oral Oral    Resp: 20 20    Weight:      SpO2:   97%    Aox3, NAD Abd soft, approp tender, ND Inc C/D/I LE SCDs in place Bilateral  CBC    Component Value Date/Time   WBC 9.4 09/01/2011 0545   RBC 3.16* 09/01/2011 0545   HGB 8.4* 09/01/2011 0545   HCT 25.6* 09/01/2011 0545   PLT 118* 09/01/2011 0545   MCV 81.0 09/01/2011 0545   MCH 26.6 09/01/2011 0545   MCHC 32.8 09/01/2011 0545   RDW 14.6 09/01/2011 0545   LYMPHSABS 1.6 08/31/2011 1004   MONOABS 0.6 08/31/2011 1004   EOSABS 0.1 08/31/2011 1004   BASOSABS 0.0 08/31/2011 1004    A/P 1) Routine PO Care 2) CHTN: Continue labetalol 750mg  TID. BPs controlled 3) Desires Neonatal circ. R/B/A reviewed.  Will proceed.

## 2011-09-02 ENCOUNTER — Encounter (HOSPITAL_COMMUNITY): Payer: Self-pay | Admitting: Obstetrics and Gynecology

## 2011-09-02 MED ORDER — OXYCODONE-ACETAMINOPHEN 5-325 MG PO TABS: 2.0000 | ORAL_TABLET | ORAL | Status: AC | PRN

## 2011-09-02 MED ORDER — IBUPROFEN 600 MG PO TABS: 600.0000 mg | ORAL_TABLET | Freq: Four times a day (QID) | ORAL | Status: AC | PRN

## 2011-09-02 MED ORDER — METHYLDOPA 250 MG PO TABS: ORAL_TABLET | ORAL | Status: DC

## 2011-09-02 MED ORDER — DOCUSATE SODIUM 100 MG PO CAPS: 100.0000 mg | ORAL_CAPSULE | Freq: Two times a day (BID) | ORAL | Status: AC

## 2011-09-02 NOTE — Discharge Summary (Signed)
Obstetric Discharge Summary Reason for Admission: cesarean section Prenatal Procedures: NST and ultrasound Intrapartum Procedures: cesarean: low cervical, transverse Postpartum Procedures: none Complications-Operative and Postpartum: none Hemoglobin  Date Value Range Status  09/01/2011 8.4* 12.0-15.0 (g/dL) Final     REPEATED TO VERIFY     DELTA CHECK NOTED     HCT  Date Value Range Status  09/01/2011 25.6* 36.0-46.0 (%) Final    Physical Exam:  General: alert, cooperative and appears stated age 36: appropriate Uterine Fundus: firm Incision: healing well DVT Evaluation: No evidence of DVT seen on physical exam.  Discharge Diagnoses: Term Pregnancy-delivered and Pre-existing chronic hypertension  Discharge Information: Date: 09/02/2011 Activity: pelvic rest Diet: routine Medications: Ibuprofen, Colace, Percocet and Aldomet Condition: stable Instructions: refer to practice specific booklet Discharge to: home Follow-up Information    Follow up with Almon Hercules., MD. (For a blood pressure check)    Contact information:   [address] [place] [address] [phone]       Follow up with Almon Hercules., MD. (For a postpartum evaluation)    Contact information:   [address] [place] [address] [phone]          Newborn Data: Live born female  Birth Weight: 9 lb 1.2 oz (4115 g) APGAR: 8, 8  Home with mother.  Shalandria Elsbernd H. 09/02/2011, 10:42 AM

## 2011-09-02 NOTE — Progress Notes (Signed)
Subjective: Postpartum Day 2: Cesarean Delivery Patient reports feeling well. Pain is controlled  Objective: Vital signs in last 24 hours: Filed Vitals:   09/01/11 2126 09/02/11 0530 09/02/11 0900 09/02/11 0910  BP: 162/89 153/92 160/109 162/105  Pulse: 108 97 91   Temp: 98.4 F (36.9 C) 98.3 F (36.8 C) 98.1 F (36.7 C)   TempSrc: Oral Oral Oral   Resp: 18 18 16    Weight:      SpO2:        Physical Exam:  General: alert, cooperative and appears stated age Lochia: appropriate Uterine Fundus: firm Incision: healing well   Basename 09/01/11 0545 08/31/11 1004  HGB 8.4* 10.7*  HCT 25.6* 33.1*    Assessment/Plan: Status post Cesarean section. Doing well postoperatively.  Continue current care BPs a little more elevated today than yesterday.  Pt without symptoms.  Reagan Behlke H. 09/02/2011, 10:28 AM

## 2011-11-07 ENCOUNTER — Encounter (HOSPITAL_BASED_OUTPATIENT_CLINIC_OR_DEPARTMENT_OTHER): Payer: Self-pay

## 2011-11-07 ENCOUNTER — Emergency Department (HOSPITAL_BASED_OUTPATIENT_CLINIC_OR_DEPARTMENT_OTHER)
Admission: EM | Admit: 2011-11-07 | Discharge: 2011-11-07 | Disposition: A | Discharge status: Home Health | Payer: Medicaid Other | Attending: Emergency Medicine | Admitting: Emergency Medicine

## 2011-11-07 DIAGNOSIS — D649 Anemia, unspecified: Secondary | ICD-10-CM | POA: Insufficient documentation

## 2011-11-07 DIAGNOSIS — K5090 Crohn's disease, unspecified, without complications: Secondary | ICD-10-CM | POA: Insufficient documentation

## 2011-11-07 DIAGNOSIS — J45909 Unspecified asthma, uncomplicated: Secondary | ICD-10-CM | POA: Insufficient documentation

## 2011-11-07 DIAGNOSIS — R21 Rash and other nonspecific skin eruption: Secondary | ICD-10-CM | POA: Insufficient documentation

## 2011-11-07 DIAGNOSIS — I10 Essential (primary) hypertension: Secondary | ICD-10-CM | POA: Insufficient documentation

## 2011-11-07 DIAGNOSIS — L509 Urticaria, unspecified: Secondary | ICD-10-CM

## 2011-11-07 MED ORDER — HYDROCORTISONE 1 % EX CREA: TOPICAL_CREAM | CUTANEOUS | Status: AC

## 2011-11-07 NOTE — ED Notes (Signed)
Pt c/o generalized rash onset over a week ago.  Pt states rash seems to be somewhat sporadic in appearance.  Initially on arms, then on chest and knees.  Pt denies any changes in environmentals.
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
## 2012-08-23 IMAGING — CT CT ABD-PELV W/ CM
2 of 4 series · 16 of 46 positions shown, 18 images · IV contrast (APPLIED)
Comparison: 02/28/2005

CLINICAL DATA: Abdominal pain.  Left abdominal pain and soreness
radiating to the left side.  Nausea.  History of fibroids, IBS,
renal stones, Crohn disease.

CT ABDOMEN AND PELVIS WITH CONTRAST
TECHNIQUE: Multidetector CT imaging of the abdomen and pelvis was
performed following the standard protocol during bolus
administration of intravenous contrast.
Contrast: 100 ml [id number]

[Series 2: abd/pelvis 5.0 b31f · axial · 0.73mm/px · z∈[-484,-29]mm · 13 of 99 slices shown, 15 images]
[im 4/99  soft-tissue]
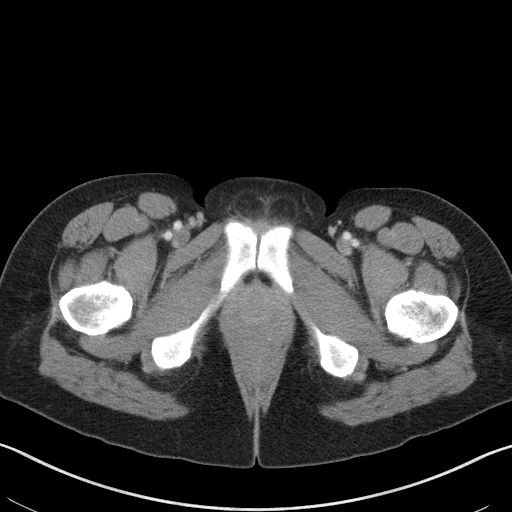
[im 4/99  bone]
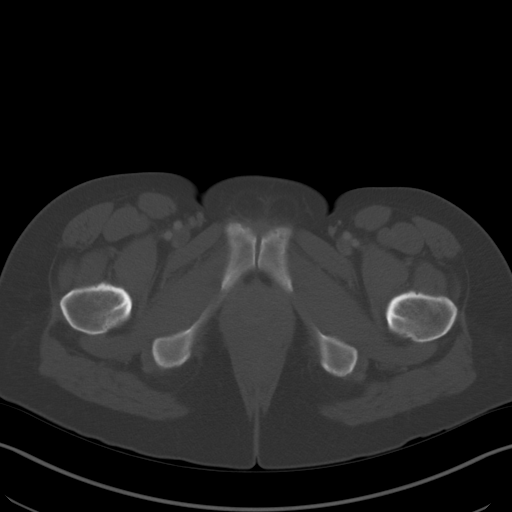
[im 12/99  soft-tissue]
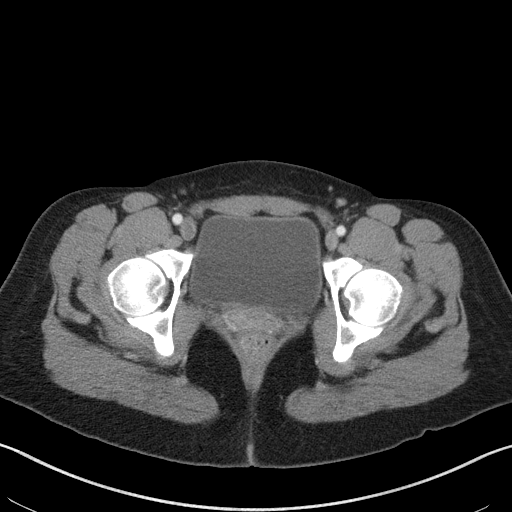
[im 19/99  soft-tissue]
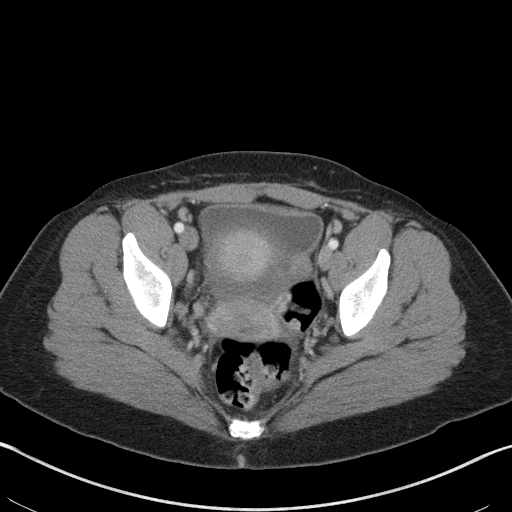
[im 27/99  soft-tissue]
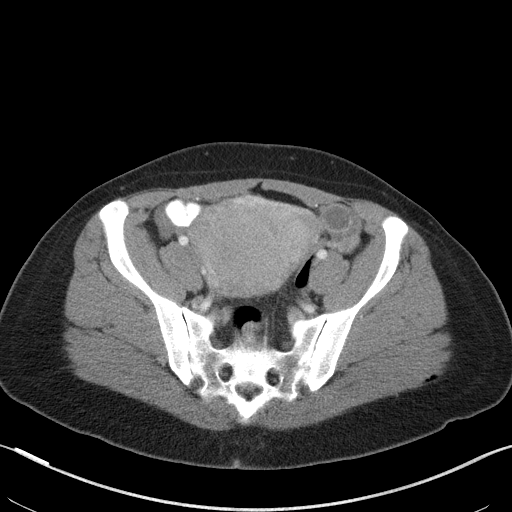
[im 34/99  soft-tissue]
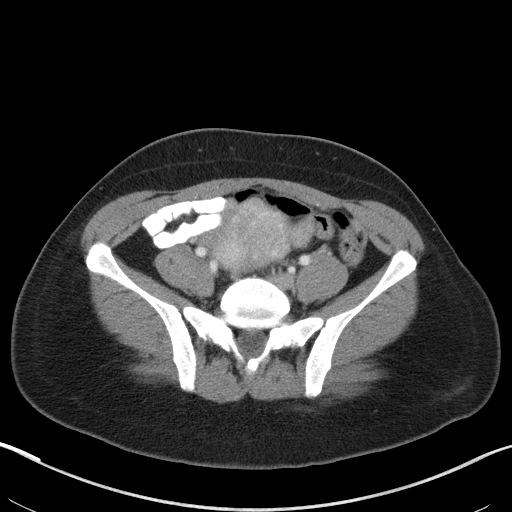
[im 42/99  soft-tissue]
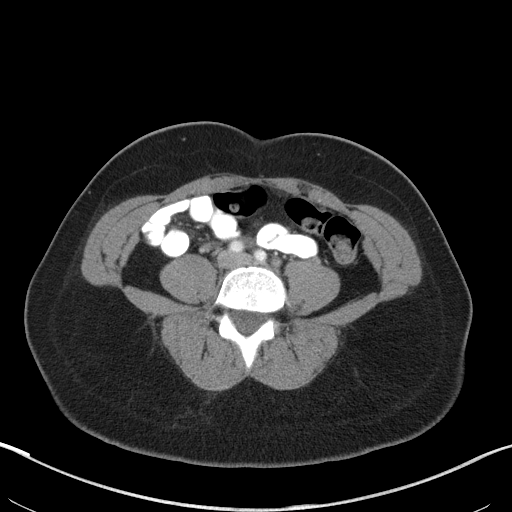
[im 50/99  soft-tissue]
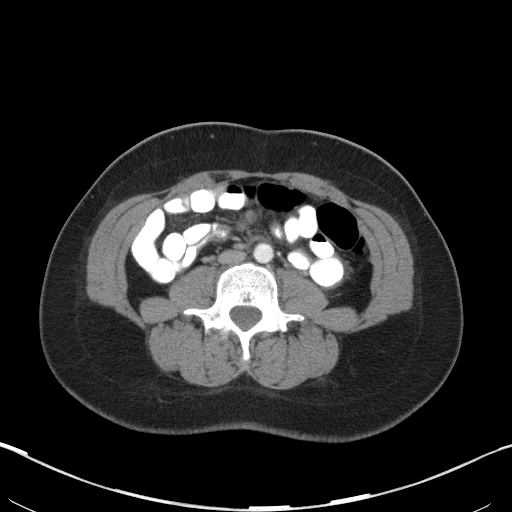
[im 57/99  soft-tissue]
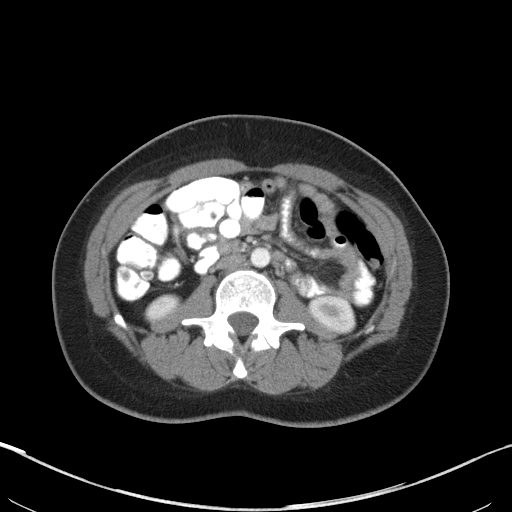
[im 65/99  soft-tissue]
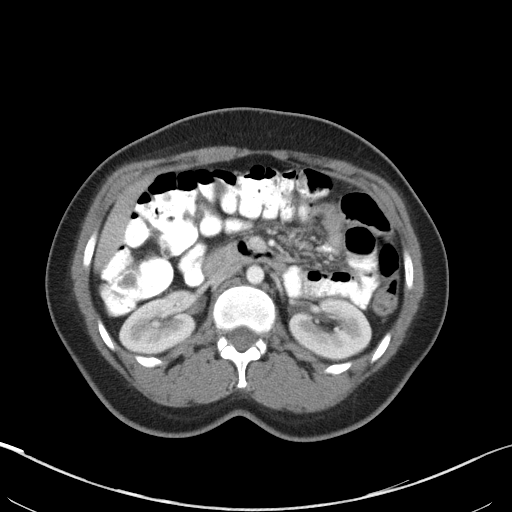
[im 65/99  bone]
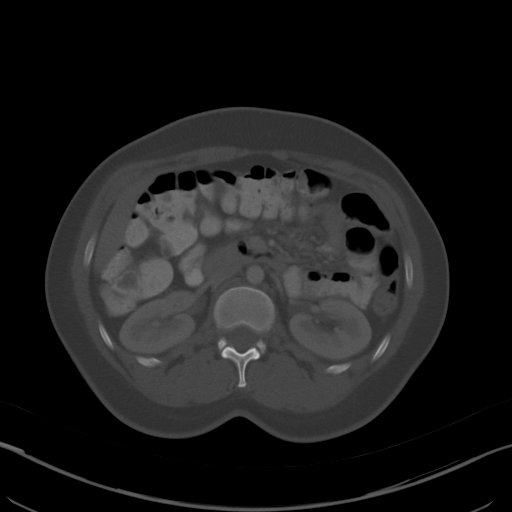
[im 72/99  soft-tissue]
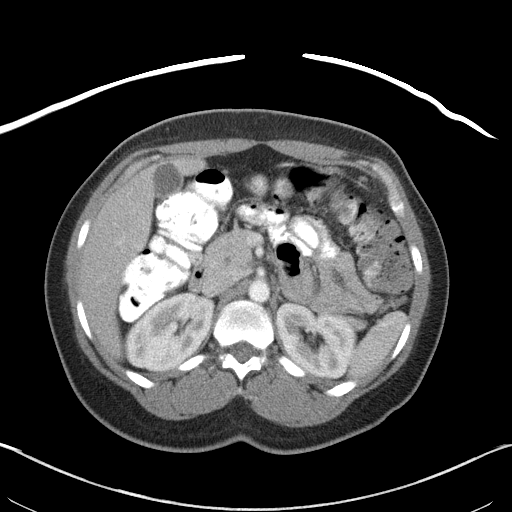
[im 80/99  soft-tissue]
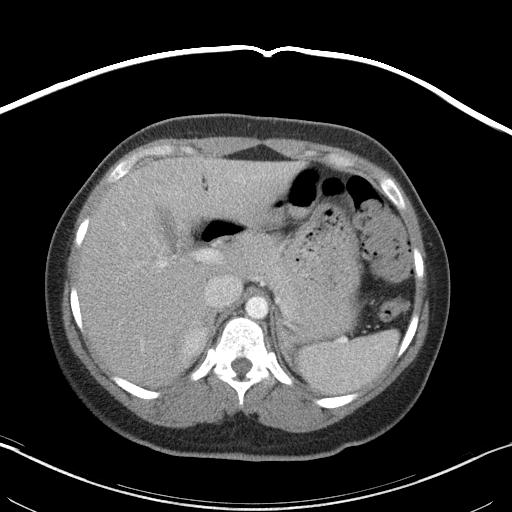
[im 87/99  soft-tissue]
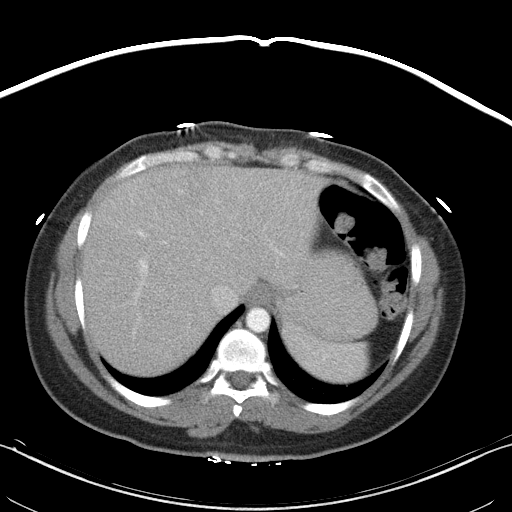
[im 95/99  soft-tissue]
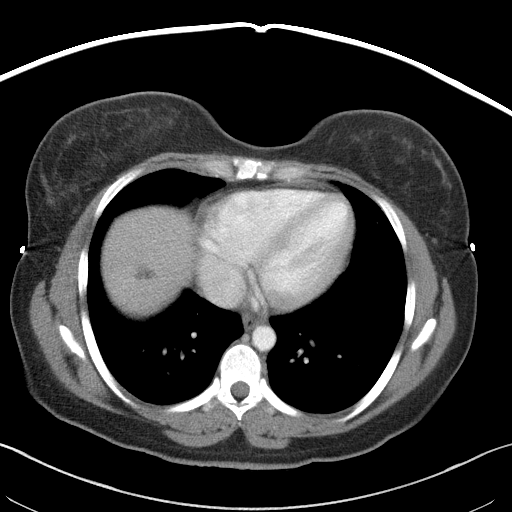

[Series 5: abd/pelvis 3.0 coronal · coronal · 0.73mm/px · 3 of 83 slices shown]
[im 28/83  soft-tissue]
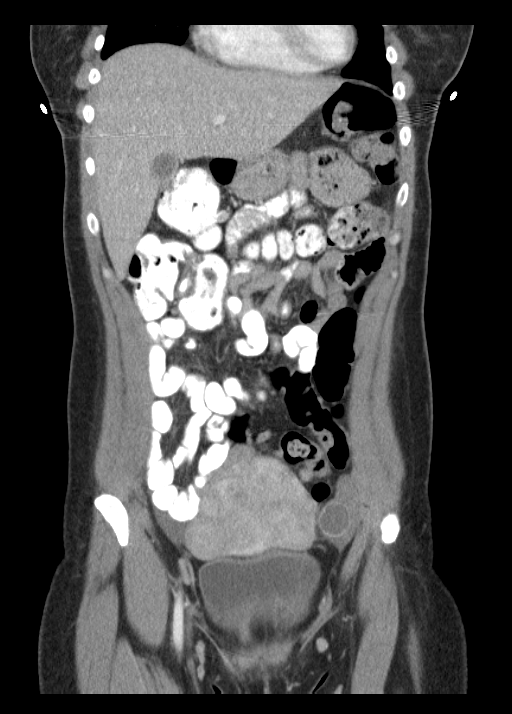
[im 37/83  soft-tissue]
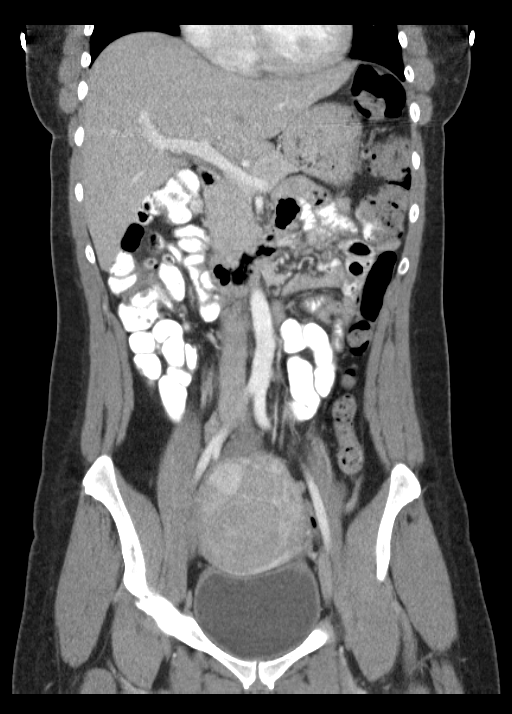
[im 46/83  soft-tissue]
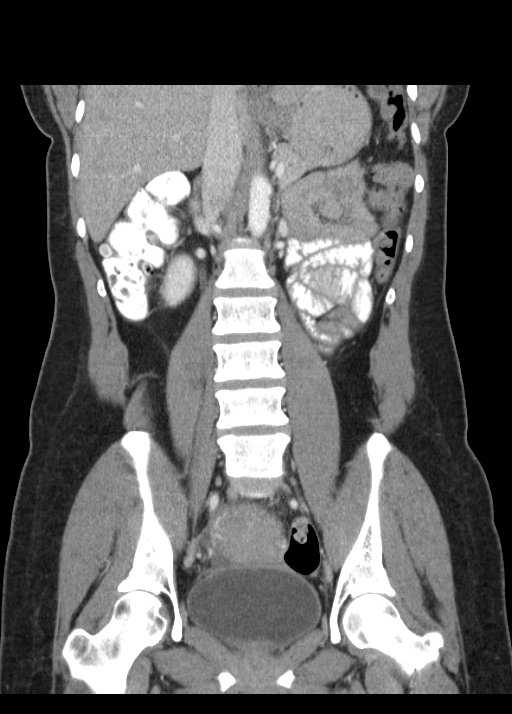

[16 of 46 positions shown; findings below may reference images not displayed]

FINDINGS: Images of the lung bases are unremarkable.  At the right
hepatic dome, there is a low attenuation circumscribed lesion
measuring 2.1 x 1.6 cm.  This is consistent with a cyst and appears
stable since prior CT chest.  No focal abnormality is identified
within the spleen, pancreas, adrenal glands, or kidneys.  The
gallbladder has a normal appearance.  The appendix is well seen and
normal in appearance.  No evidence for bowel obstruction or bowel
wall thickening.

The uterus is heterogeneous and enlarged, containing numerous
enhancing masses.  Uterus measures 13 x 10 x 8 cm.  The left ovary
is 3.5 x 3.6 cm and contains a 2.1 cm probable cyst with rim
enhancement.  The right ovary appears to be superior to the
enlarged uterus.  Small bilateral inguinal lymph nodes are
nonspecific in appearance.
IMPRESSION: 1.  Benign probable hepatic cyst.
2.  Enlarged uterus containing multiple fibroids.
3.  Left ovarian cyst 2.1 cm.

## 2013-05-09 ENCOUNTER — Other Ambulatory Visit: Payer: Self-pay | Admitting: Obstetrics and Gynecology

## 2013-07-24 ENCOUNTER — Encounter (HOSPITAL_BASED_OUTPATIENT_CLINIC_OR_DEPARTMENT_OTHER): Payer: Self-pay | Admitting: Emergency Medicine

## 2013-07-24 ENCOUNTER — Emergency Department (HOSPITAL_BASED_OUTPATIENT_CLINIC_OR_DEPARTMENT_OTHER)
Admission: EM | Admit: 2013-07-24 | Discharge: 2013-07-25 | Disposition: A | Discharge status: Other Acute Inpatient Hospital | Payer: 59 | Attending: Emergency Medicine | Admitting: Emergency Medicine

## 2013-07-24 DIAGNOSIS — D649 Anemia, unspecified: Secondary | ICD-10-CM | POA: Insufficient documentation

## 2013-07-24 DIAGNOSIS — Z8742 Personal history of other diseases of the female genital tract: Secondary | ICD-10-CM | POA: Insufficient documentation

## 2013-07-24 DIAGNOSIS — T465X5A Adverse effect of other antihypertensive drugs, initial encounter: Secondary | ICD-10-CM | POA: Insufficient documentation

## 2013-07-24 DIAGNOSIS — Z8719 Personal history of other diseases of the digestive system: Secondary | ICD-10-CM | POA: Insufficient documentation

## 2013-07-24 DIAGNOSIS — J45909 Unspecified asthma, uncomplicated: Secondary | ICD-10-CM | POA: Insufficient documentation

## 2013-07-24 DIAGNOSIS — Z79899 Other long term (current) drug therapy: Secondary | ICD-10-CM | POA: Insufficient documentation

## 2013-07-24 DIAGNOSIS — IMO0002 Reserved for concepts with insufficient information to code with codable children: Secondary | ICD-10-CM | POA: Insufficient documentation

## 2013-07-24 DIAGNOSIS — T783XXA Angioneurotic edema, initial encounter: Secondary | ICD-10-CM

## 2013-07-24 DIAGNOSIS — Z87442 Personal history of urinary calculi: Secondary | ICD-10-CM | POA: Insufficient documentation

## 2013-07-24 DIAGNOSIS — I10 Essential (primary) hypertension: Secondary | ICD-10-CM | POA: Insufficient documentation

## 2013-07-24 LAB — CBC
HEMATOCRIT: 31.5 % — AB (ref 36.0–46.0)
HEMOGLOBIN: 10.3 g/dL — AB (ref 12.0–15.0)
MCH: 26.1 pg (ref 26.0–34.0)
MCHC: 32.7 g/dL (ref 30.0–36.0)
MCV: 79.7 fL (ref 78.0–100.0)
Platelets: 251 10*3/uL (ref 150–400)
RBC: 3.95 MIL/uL (ref 3.87–5.11)
RDW: 13.5 % (ref 11.5–15.5)
WBC: 8.1 10*3/uL (ref 4.0–10.5)

## 2013-07-24 LAB — BASIC METABOLIC PANEL
BUN: 9 mg/dL (ref 6–23)
CHLORIDE: 102 meq/L (ref 96–112)
CO2: 25 mEq/L (ref 19–32)
CREATININE: 1 mg/dL (ref 0.50–1.10)
Calcium: 8.7 mg/dL (ref 8.4–10.5)
GFR, EST AFRICAN AMERICAN: 82 mL/min — AB (ref >90)
GFR, EST NON AFRICAN AMERICAN: 71 mL/min — AB (ref >90)
GLUCOSE: 111 mg/dL — AB (ref 70–99)
Potassium: 3.5 mEq/L — ABNORMAL LOW (ref 3.7–5.3)
Sodium: 139 mEq/L (ref 137–147)

## 2013-07-24 MED ORDER — FAMOTIDINE 20 MG PO TABS
20.0000 mg | ORAL_TABLET | Freq: Once | ORAL | Status: AC
  Administered 2013-07-24: 20 mg via ORAL
  Filled 2013-07-24: qty 1

## 2013-07-24 MED ORDER — DIPHENHYDRAMINE HCL 50 MG/ML IJ SOLN
25.0000 mg | Freq: Once | INTRAMUSCULAR | Status: AC
  Administered 2013-07-24: 25 mg via INTRAVENOUS
  Filled 2013-07-24: qty 1

## 2013-07-24 MED ORDER — METHYLPREDNISOLONE SODIUM SUCC 125 MG IJ SOLR
125.0000 mg | Freq: Once | INTRAMUSCULAR | Status: AC
  Administered 2013-07-24: 125 mg via INTRAVENOUS
  Filled 2013-07-24: qty 2

## 2013-07-24 NOTE — ED Notes (Signed)
MD at bedside. 

## 2013-07-24 NOTE — ED Notes (Signed)
Lip is swollen. She was taken off Lisinipril today by her MD and given medications in the office. Swelling has gotten worse. Upper lip is extremely swollen. No respiratory distress.
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
## 2013-07-25 MED ORDER — BLISTEX MEDICATED EX OINT
TOPICAL_OINTMENT | CUTANEOUS | Status: AC
  Administered 2013-07-25: 1
  Filled 2013-07-25: qty 10

## 2013-07-25 MED ORDER — FENTANYL CITRATE 0.05 MG/ML IJ SOLN
INTRAMUSCULAR | Status: AC
  Administered 2013-07-25: 50 ug
  Filled 2013-07-25: qty 2

## 2013-07-25 NOTE — ED Notes (Signed)
Spoke with [employer] supervisor stated that it would be around 7-8am for possible bed placement.

## 2013-07-25 NOTE — ED Notes (Signed)
Pt. Given crackers and soda.  Pt. Also given apple sauce.  Explained delay to Pt. And asked if she needed anything.  Pt. Reports she is fine and is aware of the time it may be before she has a bed at [facility].

## 2013-07-25 NOTE — ED Notes (Signed)
inpt bed assignment rec;d, room 631 at [facility]. Awaiting transport from [place].

## 2013-07-25 NOTE — ED Notes (Addendum)
Pt report given to [facility]  with [place].

## 2013-07-25 NOTE — ED Notes (Signed)
[facility] [place] supervisor called with bed placement, spoke with [facility] rep Rhonda and placed call for transport

## 2013-07-25 NOTE — ED Notes (Signed)
Spoke with [facility] [place] supervisor, stated that it would be around 3am when pt would get a bed due to there pateints now in the ED, and limit amount of beds.

## 2013-07-25 NOTE — ED Notes (Addendum)
Inpt room changed to 619 at [place] made aware. Pt report given to Fransisco Beau, Therapist, sports at [facility].

## 2014-02-16 ENCOUNTER — Encounter (HOSPITAL_BASED_OUTPATIENT_CLINIC_OR_DEPARTMENT_OTHER): Payer: Self-pay | Admitting: Emergency Medicine

## 2014-05-15 ENCOUNTER — Other Ambulatory Visit: Payer: Self-pay | Admitting: Obstetrics and Gynecology

## 2014-05-18 LAB — CYTOLOGY - PAP

## 2014-09-28 ENCOUNTER — Other Ambulatory Visit: Payer: Self-pay | Admitting: Obstetrics and Gynecology

## 2015-01-06 ENCOUNTER — Encounter (HOSPITAL_BASED_OUTPATIENT_CLINIC_OR_DEPARTMENT_OTHER): Payer: Self-pay | Admitting: *Deleted

## 2015-01-08 ENCOUNTER — Encounter (HOSPITAL_BASED_OUTPATIENT_CLINIC_OR_DEPARTMENT_OTHER): Payer: Self-pay | Admitting: *Deleted

## 2015-01-08 NOTE — Progress Notes (Signed)
NPO AFTER MN.  ARRIVE AT 1030.  NEEDS ISTAT,  URINE PREG.  AND EKG. WILL TAKE TOPROL AM DOS W/ SIPS OF WATER.

## 2015-01-13 ENCOUNTER — Encounter (HOSPITAL_BASED_OUTPATIENT_CLINIC_OR_DEPARTMENT_OTHER): Payer: Self-pay | Admitting: *Deleted

## 2015-01-13 ENCOUNTER — Ambulatory Visit (HOSPITAL_BASED_OUTPATIENT_CLINIC_OR_DEPARTMENT_OTHER): Payer: 59 | Admitting: Anesthesiology

## 2015-01-13 ENCOUNTER — Ambulatory Visit (HOSPITAL_BASED_OUTPATIENT_CLINIC_OR_DEPARTMENT_OTHER)
Admission: RE | Admit: 2015-01-13 | Discharge: 2015-01-13 | Disposition: A | Discharge status: Home / Self Care | Payer: 59 | Source: Ambulatory Visit | Attending: Obstetrics and Gynecology | Admitting: Obstetrics and Gynecology

## 2015-01-13 ENCOUNTER — Other Ambulatory Visit: Payer: Self-pay | Admitting: Obstetrics and Gynecology

## 2015-01-13 ENCOUNTER — Encounter (HOSPITAL_BASED_OUTPATIENT_CLINIC_OR_DEPARTMENT_OTHER)
Admission: RE | Disposition: A | Discharge status: Home / Self Care | Payer: Self-pay | Source: Ambulatory Visit | Attending: Obstetrics and Gynecology

## 2015-01-13 DIAGNOSIS — K589 Irritable bowel syndrome without diarrhea: Secondary | ICD-10-CM | POA: Insufficient documentation

## 2015-01-13 DIAGNOSIS — D259 Leiomyoma of uterus, unspecified: Secondary | ICD-10-CM | POA: Insufficient documentation

## 2015-01-13 DIAGNOSIS — N882 Stricture and stenosis of cervix uteri: Secondary | ICD-10-CM | POA: Insufficient documentation

## 2015-01-13 DIAGNOSIS — M1990 Unspecified osteoarthritis, unspecified site: Secondary | ICD-10-CM | POA: Insufficient documentation

## 2015-01-13 DIAGNOSIS — Z888 Allergy status to other drugs, medicaments and biological substances status: Secondary | ICD-10-CM | POA: Insufficient documentation

## 2015-01-13 DIAGNOSIS — Z87442 Personal history of urinary calculi: Secondary | ICD-10-CM | POA: Diagnosis not present

## 2015-01-13 DIAGNOSIS — J45909 Unspecified asthma, uncomplicated: Secondary | ICD-10-CM | POA: Diagnosis not present

## 2015-01-13 DIAGNOSIS — K5090 Crohn's disease, unspecified, without complications: Secondary | ICD-10-CM | POA: Diagnosis not present

## 2015-01-13 DIAGNOSIS — R011 Cardiac murmur, unspecified: Secondary | ICD-10-CM | POA: Insufficient documentation

## 2015-01-13 DIAGNOSIS — I10 Essential (primary) hypertension: Secondary | ICD-10-CM | POA: Insufficient documentation

## 2015-01-13 HISTORY — DX: Crohn's disease, unspecified, without complications: K50.90

## 2015-01-13 HISTORY — DX: Personal history of other diseases of the female genital tract: Z87.42

## 2015-01-13 HISTORY — DX: Personal history of urinary calculi: Z87.442

## 2015-01-13 HISTORY — DX: Unspecified osteoarthritis, unspecified site: M19.90

## 2015-01-13 HISTORY — DX: Unspecified asthma, uncomplicated: J45.909

## 2015-01-13 HISTORY — DX: Dermatitis, unspecified: L30.9

## 2015-01-13 HISTORY — PX: DILATATION & CURETTAGE/HYSTEROSCOPY WITH MYOSURE: SHX6511

## 2015-01-13 HISTORY — DX: Leiomyoma of uterus, unspecified: D25.9

## 2015-01-13 HISTORY — DX: Cardiac murmur, unspecified: R01.1

## 2015-01-13 LAB — POCT I-STAT 4, (NA,K, GLUC, HGB,HCT)
Glucose, Bld: 97 mg/dL (ref 65–99)
HCT: 36 % (ref 36.0–46.0)
Hemoglobin: 12.2 g/dL (ref 12.0–15.0)
POTASSIUM: 3.3 mmol/L — AB (ref 3.5–5.1)
SODIUM: 140 mmol/L (ref 135–145)

## 2015-01-13 LAB — CBC
HCT: 34.3 % — ABNORMAL LOW (ref 36.0–46.0)
HEMOGLOBIN: 10.8 g/dL — AB (ref 12.0–15.0)
MCH: 24.2 pg — ABNORMAL LOW (ref 26.0–34.0)
MCHC: 31.5 g/dL (ref 30.0–36.0)
MCV: 76.9 fL — ABNORMAL LOW (ref 78.0–100.0)
Platelets: 238 10*3/uL (ref 150–400)
RBC: 4.46 MIL/uL (ref 3.87–5.11)
RDW: 14.8 % (ref 11.5–15.5)
WBC: 7.8 10*3/uL (ref 4.0–10.5)

## 2015-01-13 LAB — ABO/RH: ABO/RH(D): O POS

## 2015-01-13 LAB — TYPE AND SCREEN
ABO/RH(D): O POS
ANTIBODY SCREEN: NEGATIVE

## 2015-01-13 LAB — POCT PREGNANCY, URINE: Preg Test, Ur: NEGATIVE

## 2015-01-13 SURGERY — DILATATION & CURETTAGE/HYSTEROSCOPY WITH MYOSURE
Anesthesia: General | Site: Vagina

## 2015-01-13 MED ORDER — LIDOCAINE HCL (CARDIAC) 20 MG/ML IV SOLN
INTRAVENOUS | Status: DC | PRN
  Administered 2015-01-13: 80 mg via INTRAVENOUS

## 2015-01-13 MED ORDER — FENTANYL CITRATE (PF) 100 MCG/2ML IJ SOLN
INTRAMUSCULAR | Status: DC | PRN
  Administered 2015-01-13 (×8): 25 ug via INTRAVENOUS

## 2015-01-13 MED ORDER — OXYCODONE HCL 5 MG PO TABS
5.0000 mg | ORAL_TABLET | Freq: Once | ORAL | Status: AC
  Administered 2015-01-13: 5 mg via ORAL
  Filled 2015-01-13: qty 1

## 2015-01-13 MED ORDER — PROMETHAZINE HCL 25 MG/ML IJ SOLN
6.2500 mg | INTRAMUSCULAR | Status: DC | PRN
  Filled 2015-01-13: qty 1

## 2015-01-13 MED ORDER — MIDAZOLAM HCL 5 MG/5ML IJ SOLN
INTRAMUSCULAR | Status: DC | PRN
  Administered 2015-01-13 (×2): 1 mg via INTRAVENOUS

## 2015-01-13 MED ORDER — FENTANYL CITRATE (PF) 100 MCG/2ML IJ SOLN
INTRAMUSCULAR | Status: AC
  Filled 2015-01-13: qty 4

## 2015-01-13 MED ORDER — ONDANSETRON HCL 4 MG/2ML IJ SOLN
INTRAMUSCULAR | Status: DC | PRN
  Administered 2015-01-13: 4 mg via INTRAVENOUS

## 2015-01-13 MED ORDER — DEXAMETHASONE SODIUM PHOSPHATE 10 MG/ML IJ SOLN
INTRAMUSCULAR | Status: DC | PRN
  Administered 2015-01-13: 10 mg via INTRAVENOUS

## 2015-01-13 MED ORDER — LACTATED RINGERS IV SOLN
INTRAVENOUS | Status: DC
  Administered 2015-01-13: 11:00:00 via INTRAVENOUS
  Filled 2015-01-13: qty 1000

## 2015-01-13 MED ORDER — KETOROLAC TROMETHAMINE 30 MG/ML IJ SOLN
INTRAMUSCULAR | Status: DC | PRN
  Administered 2015-01-13: 30 mg via INTRAVENOUS

## 2015-01-13 MED ORDER — SODIUM CHLORIDE 0.9 % IR SOLN
Status: DC | PRN
  Administered 2015-01-13: 3000 mL

## 2015-01-13 MED ORDER — ACETAMINOPHEN 10 MG/ML IV SOLN
INTRAVENOUS | Status: DC | PRN
  Administered 2015-01-13: 1000 mg via INTRAVENOUS

## 2015-01-13 MED ORDER — OXYCODONE HCL 5 MG PO TABS
ORAL_TABLET | ORAL | Status: AC
  Filled 2015-01-13: qty 1

## 2015-01-13 MED ORDER — IBUPROFEN 600 MG PO TABS: 600.0000 mg | ORAL_TABLET | Freq: Four times a day (QID) | ORAL | Status: DC | PRN

## 2015-01-13 MED ORDER — MIDAZOLAM HCL 2 MG/2ML IJ SOLN
INTRAMUSCULAR | Status: AC
  Filled 2015-01-13: qty 4

## 2015-01-13 MED ORDER — FENTANYL CITRATE (PF) 100 MCG/2ML IJ SOLN
25.0000 ug | INTRAMUSCULAR | Status: DC | PRN
  Administered 2015-01-13 (×2): 25 ug via INTRAVENOUS
  Filled 2015-01-13: qty 1

## 2015-01-13 MED ORDER — FENTANYL CITRATE (PF) 100 MCG/2ML IJ SOLN
INTRAMUSCULAR | Status: AC
  Filled 2015-01-13: qty 2

## 2015-01-13 MED ORDER — PROPOFOL 10 MG/ML IV BOLUS
INTRAVENOUS | Status: DC | PRN
  Administered 2015-01-13: 180 mg via INTRAVENOUS

## 2015-01-13 MED ORDER — LIDOCAINE HCL 1 % IJ SOLN
INTRAMUSCULAR | Status: DC | PRN
  Administered 2015-01-13: 10 mL

## 2015-01-13 MED ORDER — OXYCODONE-ACETAMINOPHEN 5-325 MG PO TABS: 1.0000 | ORAL_TABLET | ORAL | Status: DC | PRN

## 2015-01-13 SURGICAL SUPPLY — 33 items
CANISTER SUCTION 2500CC (MISCELLANEOUS) ×2 IMPLANT
CATH ROBINSON RED A/P 16FR (CATHETERS) ×2 IMPLANT
CLOTH BEACON ORANGE TIMEOUT ST (SAFETY) ×2 IMPLANT
COVER BACK TABLE 60X90IN (DRAPES) ×2 IMPLANT
COVER MAYO STAND STRL (DRAPES) ×2 IMPLANT
COVER PROBE W GEL 5X96 (DRAPES) IMPLANT
DEVICE MYOSURE CLASSIC (MISCELLANEOUS) ×1 IMPLANT
DRAPE LG THREE QUARTER DISP (DRAPES) ×2 IMPLANT
DRAPE UNDERBUTTOCKS STRL (DRAPE) ×2 IMPLANT
GLOVE BIO SURGEON STRL SZ7.5 (GLOVE) ×2 IMPLANT
GLOVE BIOGEL M 6.5 STRL (GLOVE) ×1 IMPLANT
GLOVE BIOGEL PI IND STRL 6.5 (GLOVE) IMPLANT
GLOVE BIOGEL PI IND STRL 7.5 (GLOVE) IMPLANT
GLOVE BIOGEL PI INDICATOR 6.5 (GLOVE) ×1
GLOVE BIOGEL PI INDICATOR 7.5 (GLOVE) ×1
GOWN STRL REUS W/ TWL XL LVL3 (GOWN DISPOSABLE) ×1 IMPLANT
GOWN STRL REUS W/TWL LRG LVL3 (GOWN DISPOSABLE) ×1 IMPLANT
GOWN STRL REUS W/TWL XL LVL3 (GOWN DISPOSABLE) ×2
GOWN W/2 COTTON TOWELS 2 STD (GOWNS) ×2 IMPLANT
IV NS IRRIG 3000ML ARTHROMATIC (IV SOLUTION) ×1 IMPLANT
LEGGING LITHOTOMY PAIR STRL (DRAPES) ×2 IMPLANT
MANIFOLD NEPTUNE II (INSTRUMENTS) IMPLANT
NS IRRIG 500ML POUR BTL (IV SOLUTION) ×1 IMPLANT
PACK BASIN DAY SURGERY FS (CUSTOM PROCEDURE TRAY) ×2 IMPLANT
PAD OB MATERNITY 4.3X12.25 (PERSONAL CARE ITEMS) ×2 IMPLANT
PAD PREP 24X48 CUFFED NSTRL (MISCELLANEOUS) ×2 IMPLANT
SET GENESYS HTA PROCERVA (MISCELLANEOUS) ×1 IMPLANT
TOWEL OR 17X24 6PK STRL BLUE (TOWEL DISPOSABLE) ×4 IMPLANT
TRAY DSU PREP LF (CUSTOM PROCEDURE TRAY) ×2 IMPLANT
TUBE CONNECTING 12X1/4 (SUCTIONS) IMPLANT
TUBING AQUILEX INFLOW (TUBING) ×1 IMPLANT
TUBING AQUILEX OUTFLOW (TUBING) ×1 IMPLANT
WATER STERILE IRR 500ML POUR (IV SOLUTION) ×1 IMPLANT

## 2015-01-13 NOTE — Transfer of Care (Signed)
  Immediate Anesthesia Transfer of Care Note  Patient: Colleen Baker  Procedure(s) Performed: Procedure(s) (LRB): DILATATION & CURETTAGE/HYSTEROSCOPY WITH  MYOSURE (N/A)  Patient Location: PACU  Anesthesia Type: General  Level of Consciousness: awake, sedated, patient cooperative and responds to stimulation  Airway & Oxygen Therapy: Patient Spontanous Breathing and Patient connected to face mask oxygen  Post-op Assessment: Report given to PACU RN, Post -op Vital signs reviewed and stable and Patient moving all extremities  Post vital signs: Reviewed and stable  Complications: No apparent anesthesia complications

## 2015-01-13 NOTE — Op Note (Signed)
Pre-Operative Diagnosis: 1) cervical stenosis 2) uterine fibroids 3) abnormal uterine bleeding 4) dysmenorrhea Postoperative Diagnosis: same Procedure: diagnostic hysteroscopy, cervical dilation, curettage, hysteroscopic myomectomy with Myosure Surgeon: Dr. Vanessa Kick Assistant: None Operative Findings: fibroid uterus measuring approximately 14 week size on bimanual exam. Cervical stenosis. Uterus sounds to 10 cm. Bilateral tubal ostia are visualized. The endometrial cavity was noted to be arcuate shaped at the fundus.  Approximately 1.5 cm submucosal fibroid with in the right lower uterine segment. Specimen: Endometrial curettings and submucosal fibroid. Fluid deficit 690 cc. EBL: minimal  Ms. Hayworth Is a 39 year old gravida 2 para 54 who presents for definitive surgical management for Cervical stenosis, abnormal uterine bleeding, uterine fibroids, and dysmenorrhea. Please see the patient's history and physical for complete details of the history. Management options were discussed with the patient. R/B/A reviewed. Following appropriate informed consent was taken to the operating room. The patient was appropriately identified during a time out procedure. LMA anesthesia was administered and the patient was placed in the dorsal lithotomy position. The patient was prepped and draped in the normal sterile fashion. A speculum was placed into the vagina, a single-tooth tenaculum was placed on the anterior lip of the cervix, and 10 cc of 1% lidocaine was administered in a paracervical fashion. The cervix was serially dilated with Kennon Rounds dilators. The uterus sounded to 10 cm. The hysteroscope was introduced into the uterine cavity for the above findings. Initially visualization was poor due to the presence of abundant endometrial tissue. The hysteroscope was removed and a sharp curettage was performed. The hysteroscope was replaced and the endometrial cavity anatomy could be visualized. A 1.5 cm submucosal fibroid  was noted. The myosure device was then introduced and the submucosal fibroid was morcellated to the level of the endometrial wall. The cavity was then noted to be smooth. This completed surgical procedure. The single tooth tenaculum was removed from the anterior lip of the cervix and speculum was removed from the vagina. All sponge, lap, needle counts were correct 2. The patient was taken to the postanesthesia care unit in stable condition following the procedure

## 2015-01-13 NOTE — Anesthesia Postprocedure Evaluation (Signed)
  Anesthesia Post-op Note  Patient: Colleen Baker  Procedure(s) Performed: Procedure(s) (LRB): DILATATION & CURETTAGE/HYSTEROSCOPY WITH  MYOSURE (N/A)  Patient Location: PACU  Anesthesia Type: General  Level of Consciousness: awake and alert   Airway and Oxygen Therapy: Patient Spontanous Breathing  Post-op Pain: mild  Post-op Assessment: Post-op Vital signs reviewed, Patient's Cardiovascular Status Stable, Respiratory Function Stable, Patent Airway and No signs of Nausea or vomiting  Last Vitals:  Filed Vitals:   01/13/15 1330  BP: 160/112  Pulse:   Temp:   Resp:     Post-op Vital Signs: stable   Complications: No apparent anesthesia complications Patient with chronic hypertension that she has been struggling as an outpatient to improve. We have reinforced need to continue seeking optimal long term BP management.

## 2015-01-13 NOTE — H&P (Signed)
Colleen Baker is an 39 y.o. female.  39 yo G2P1011 presents for surgical management of cervical stenosis, abnormal uterine bleeding, uterine fibroids and dysmenorrhea.  As part of an evaluation for abnormla uterine bleeding a saline infusion sonogram was attempted in teh office. Cervical stenosis was discovered which precluded the complettion of the SIS. R/B/A of the procedure were discussed at length and the patient wishes to proceed  Patient's last menstrual period was 01/05/2015 (exact date).    Past Medical History  Diagnosis Date  . Irritable bowel   . Hypertension   . Uterine fibroid   . History of abnormal cervical Pap smear   . Heart murmur     asymptomatic   . Asthma with allergic rhinitis   . Crohn's disease in remission   . History of kidney stones   . Arthritis   . Eczema     Past Surgical History  Procedure Laterality Date  . Leep  1990's  . Cesarean section  08/31/2011    Procedure: CESAREAN SECTION;  Surgeon: Farrel Gobble. Harrington Challenger, MD;  Location: [facility];  Service: Gynecology;  Laterality: N/A;  . Myomectomy abdominal approach  02-15-2005  . Robot assisted myomectomy  03/ 2012  . Colonoscopy  last one 2015    Family History  Problem Relation Age of Onset  . Hypertension Mother   . Kidney disease Father     Social History:  reports that she has never smoked. She has never used smokeless tobacco. She reports that she drinks alcohol. She reports that she does not use illicit drugs.  Allergies:  Allergies  Allergen Reactions  . Aciphex [Rabeprazole Sodium] Hives  . Lisinopril Swelling    Facial      (Not in a hospital admission)  ROS: as above  Last menstrual period 01/05/2015. Physical Exam   AOX3 Normal respiratory effort abd soft  TVUS: AV uterus with multiple uterine fibroids, at least 12 measured. Largest fibroid is 2.6 cm with calcified border that appears adjacent to the endometrial cavity and may actually protrude into the cavity. Hemorrhagic CL  cyst on Right, Left adnexa WNL. Unable to perform SIS due to severe cervical stenosis   No results found for this or any previous visit (from the past 24 hour(s)).  No results found.  Assessment/Plan: 1) Hysteroscopy, D&C, with possible myosure  ROSS,KENDRA H. 01/13/2015, 8:18 AM

## 2015-01-13 NOTE — Anesthesia Preprocedure Evaluation (Addendum)
Anesthesia Evaluation  Patient identified by MRN, date of birth, ID band Patient awake    Reviewed: Allergy & Precautions, H&P , NPO status , Patient's Chart, lab work & pertinent test results, reviewed documented beta blocker date and time   History of Anesthesia Complications Negative for: history of anesthetic complications  Airway Mallampati: I  TM Distance: >3 FB Neck ROM: full    Dental no notable dental hx.    Pulmonary asthma ,    Pulmonary exam normal breath sounds clear to auscultation       Cardiovascular hypertension, Pt. on medications negative cardio ROS Normal cardiovascular exam Rhythm:regular Rate:Normal     Neuro/Psych negative neurological ROS     GI/Hepatic negative GI ROS, Neg liver ROS,   Endo/Other  negative endocrine ROS  Renal/GU negative Renal ROS     Musculoskeletal   Abdominal   Peds  Hematology negative hematology ROS (+)   Anesthesia Other Findings Uterine fibroid here for hysteroscopy and myosure  Reproductive/Obstetrics negative OB ROS                            Anesthesia Physical Anesthesia Plan  ASA: II  Anesthesia Plan: General   Post-op Pain Management:    Induction: Intravenous  Airway Management Planned: LMA  Additional Equipment:   Intra-op Plan:   Post-operative Plan: Extubation in OR  Informed Consent: I have reviewed the patients History and Physical, chart, labs and discussed the procedure including the risks, benefits and alternatives for the proposed anesthesia with the patient or authorized representative who has indicated his/her understanding and acceptance.   Dental Advisory Given  Plan Discussed with: Anesthesiologist, CRNA and Surgeon  Anesthesia Plan Comments:         Anesthesia Quick Evaluation

## 2015-01-13 NOTE — Discharge Instructions (Signed)

## 2015-01-13 NOTE — Anesthesia Procedure Notes (Signed)
Procedure Name: LMA Insertion Date/Time: 01/13/2015 12:01 PM Performed by: Justice Rocher Pre-anesthesia Checklist: Patient identified, Emergency Drugs available, Suction available and Patient being monitored Patient Re-evaluated:Patient Re-evaluated prior to inductionOxygen Delivery Method: Circle System Utilized Preoxygenation: Pre-oxygenation with 100% oxygen Intubation Type: IV induction Ventilation: Mask ventilation without difficulty LMA: LMA inserted LMA Size: 4.0 Number of attempts: 1 Airway Equipment and Method: Bite block Placement Confirmation: positive ETCO2 Tube secured with: Tape Dental Injury: Teeth and Oropharynx as per pre-operative assessment

## 2015-01-14 ENCOUNTER — Encounter (HOSPITAL_BASED_OUTPATIENT_CLINIC_OR_DEPARTMENT_OTHER): Payer: Self-pay | Admitting: Obstetrics and Gynecology

## 2015-05-07 DIAGNOSIS — R519 Headache, unspecified: Secondary | ICD-10-CM | POA: Insufficient documentation

## 2015-10-14 DIAGNOSIS — I10 Essential (primary) hypertension: Secondary | ICD-10-CM | POA: Insufficient documentation

## 2015-12-07 ENCOUNTER — Other Ambulatory Visit: Payer: Self-pay | Admitting: Obstetrics and Gynecology

## 2015-12-08 LAB — CYTOLOGY - PAP

## 2016-01-06 ENCOUNTER — Ambulatory Visit (INDEPENDENT_AMBULATORY_CARE_PROVIDER_SITE_OTHER): Payer: 59 | Admitting: Allergy and Immunology

## 2016-01-06 ENCOUNTER — Encounter: Payer: Self-pay | Admitting: Allergy and Immunology

## 2016-01-06 VITALS — BP 158/90 | HR 74 | Temp 98.6°F | Resp 16 | Ht 70.0 in | Wt 167.2 lb

## 2016-01-06 DIAGNOSIS — L209 Atopic dermatitis, unspecified: Secondary | ICD-10-CM

## 2016-01-06 DIAGNOSIS — R0600 Dyspnea, unspecified: Secondary | ICD-10-CM

## 2016-01-06 DIAGNOSIS — J3089 Other allergic rhinitis: Secondary | ICD-10-CM | POA: Insufficient documentation

## 2016-01-06 DIAGNOSIS — J4530 Mild persistent asthma, uncomplicated: Secondary | ICD-10-CM | POA: Insufficient documentation

## 2016-01-06 DIAGNOSIS — L309 Dermatitis, unspecified: Secondary | ICD-10-CM

## 2016-01-06 MED ORDER — TRIAMCINOLONE ACETONIDE 0.1 % EX CREA: TOPICAL_CREAM | CUTANEOUS | 3 refills | Status: DC

## 2016-01-06 MED ORDER — FLUTICASONE PROPIONATE 50 MCG/ACT NA SUSP
2.0000 | Freq: Every day | NASAL | 5 refills | Status: DC | PRN
Start: 2016-01-06 — End: 2019-09-17

## 2016-01-06 MED ORDER — LEVOCETIRIZINE DIHYDROCHLORIDE 5 MG PO TABS: 5.0000 mg | ORAL_TABLET | Freq: Every evening | ORAL | 5 refills | Status: DC

## 2016-01-06 MED ORDER — ALBUTEROL SULFATE 108 (90 BASE) MCG/ACT IN AEPB
2.0000 | INHALATION_SPRAY | RESPIRATORY_TRACT | 2 refills | Status: DC | PRN
Start: 2016-01-06 — End: 2020-01-22

## 2016-01-06 NOTE — Patient Instructions (Addendum)
Dermatitis Unclear etiology. This does not appear to be urticaria, contact dermatitis, or atopic dermatitis. Food allergen skin tests were negative today despite a positive histamine control.  A prescription has been provided for triamcinolone 0.1% cream sparingly to affected areas twice daily as needed.  To alleviate pruritus, iInstructions have been provided and discussed for H1/H2 receptor blockade with step-wise increase/decrease to find lowest effective dose.  Dermatology evaluation with biopsy of an active lesion is recommended.  Perennial allergic rhinitis  Aeroallergen avoidance measures have been discussed and provided in written form.  A prescription has been provided for levocetirizine, 5mg  daily as needed.  A prescription has been provided for fluticasone nasal spray, 2 sprays per nostril daily as needed. Proper nasal spray technique has been discussed and demonstrated.  I have also recommended nasal saline spray (i.e., Simply Saline) or nasal saline lavage (i.e., NeilMed) as needed prior to medicated nasal sprays.  If allergen avoidance measures and medications fail to adequately relieve symptoms, aeroallergen immunotherapy will be considered.  Dyspnea The patient's history suggests asthma, however spirometry today does not meet the ATS criteria for that diagnosis.  A prescription has been provided for ProAir Respiclick, 1-2 inhalations every 4-6 hours as needed.  Subjective and objective measures of pulmonary function will be followed and the treatment plan will be adjusted accordingly.  Atopic dermatitis  Continue appropriate skin care measures and other recommendations per [facility].   Return in about 4 months (around 05/07/2016), or if symptoms worsen or fail to improve.  Urticaria (Hives)  . Levocetirizine (Xyzal) 5 mg twice a day and ranitidine (Zantac) 150 mg twice a day. If no symptoms for 7-14 days then decrease to. . Levocetirizine  (Xyzal) 5 mg twice a day and ranitidine (Zantac) 150 mg once a day.  If no symptoms for 7-14 days then decrease to. . Levocetirizine (Xyzal) 5 mg twice a day.  If no symptoms for 7-14 days then decrease to. . Levocetirizine (Xyzal) 5 mg once a day.  May use Benadryl (diphenhydramine) as needed for breakthrough symptoms       If symptoms return, then step up dosage  Control of Mold Allergen  Mold and fungi can grow on a variety of surfaces provided certain temperature and moisture conditions exist.  Outdoor molds grow on plants, decaying vegetation and soil.  The major outdoor mold, Alternaria and Cladosporium, are found in very high numbers during hot and dry conditions.  Generally, a late Summer - Fall peak is seen for common outdoor fungal spores.  Rain will temporarily lower outdoor mold spore count, but counts rise rapidly when the rainy period ends.  The most important indoor molds are Aspergillus and Penicillium.  Dark, humid and poorly ventilated basements are ideal sites for mold growth.  The next most common sites of mold growth are the bathroom and the kitchen.  Outdoor [employer] 1. Use air conditioning and keep windows closed 2. Avoid exposure to decaying vegetation. 3. Avoid leaf raking. 4. Avoid grain handling. 5. Consider wearing a face mask if working in moldy areas.  Indoor Mold Control 1. Maintain humidity below 50%. 2. Clean washable surfaces with 5% bleach solution. 3. Remove sources e.g. Contaminated carpets.  Control of House Dust Mite Allergen  House dust mites play a major role in allergic asthma and rhinitis.  They occur in environments with high humidity wherever human skin, the food for dust mites is found. High levels have been detected in dust obtained from mattresses, pillows, carpets, upholstered furniture,  bed covers, clothes and soft toys.  The principal allergen of the house dust mite is found in its feces.  A gram of dust may contain 1,000 mites and  250,000 fecal particles.  Mite antigen is easily measured in the air during house cleaning activities.    1. Encase mattresses, including the box spring, and pillow, in an air tight cover.  Seal the zipper end of the encased mattresses with wide adhesive tape. 2. Wash the bedding in water of 130 degrees Farenheit weekly.  Avoid cotton comforters/quilts and flannel bedding: the most ideal bed covering is the dacron comforter. 3. Remove all upholstered furniture from the bedroom. 4. Remove carpets, carpet padding, rugs, and non-washable window drapes from the bedroom.  Wash drapes weekly or use plastic window coverings. 5. Remove all non-washable stuffed toys from the bedroom.  Wash stuffed toys weekly. 6. Have the room cleaned frequently with a vacuum cleaner and a damp dust-mop.  The patient should not be in a room which is being cleaned and should wait 1 hour after cleaning before going into the room. 7. Close and seal all heating outlets in the bedroom.  Otherwise, the room will become filled with dust-laden air.  An electric heater can be used to heat the room. 8. Reduce indoor humidity to less than 50%.  Do not use a humidifier.  Control of Cockroach Allergen  Cockroach allergen has been identified as an important cause of acute attacks of asthma, especially in urban settings.  There are fifty-five species of cockroach that exist in the [place], however only three, the [place], [place] species produce allergen that can affect patients with Asthma.  Allergens can be obtained from fecal particles, egg casings and secretions from cockroaches.    1. Remove food sources. 2. Reduce access to water. 3. Seal access and entry points. 4. Spray runways with 0.5-1% Diazinon or Chlorpyrifos 5. Blow boric acid power under stoves and refrigerator. 6. Place bait stations (hydramethylnon) at feeding sites.

## 2016-01-06 NOTE — Assessment & Plan Note (Signed)
   Continue appropriate skin care measures and other recommendations per [facility].

## 2016-01-06 NOTE — Assessment & Plan Note (Addendum)
   Aeroallergen avoidance measures have been discussed and provided in written form.  A prescription has been provided for levocetirizine, 5mg  daily as needed.  A prescription has been provided for fluticasone nasal spray, 2 sprays per nostril daily as needed. Proper nasal spray technique has been discussed and demonstrated.  I have also recommended nasal saline spray (i.e., Simply Saline) or nasal saline lavage (i.e., NeilMed) as needed prior to medicated nasal sprays.  If allergen avoidance measures and medications fail to adequately relieve symptoms, aeroallergen immunotherapy will be considered.

## 2016-01-06 NOTE — Assessment & Plan Note (Signed)
Unclear etiology. This does not appear to be urticaria, contact dermatitis, or atopic dermatitis. Food allergen skin tests were negative today despite a positive histamine control.  A prescription has been provided for triamcinolone 0.1% cream sparingly to affected areas twice daily as needed.  To alleviate pruritus, iInstructions have been provided and discussed for H1/H2 receptor blockade with step-wise increase/decrease to find lowest effective dose.  Dermatology evaluation with biopsy of an active lesion is recommended.

## 2016-01-06 NOTE — Progress Notes (Signed)
New Patient Note  RE: Colleen Baker MRN: [id number] DOB: December 30, 1975 Date of Office Visit: 01/06/2016  Referring provider: Daylene Posey, FNP Primary care provider: [facility], FNP  Chief Complaint: Rash; Breathing Problem; Allergic Rhinitis ; and Eczema   History of present illness: Colleen Baker is a 40 y.o. female presenting today for consultation of dermatitis, breathing problems, and rhinitis.  In late July she noticed a red bump on her chest.  She described the lesion is pruritic and "almost like a bug bite.".  She states that the lesion eventually scabbed over, the scab fell off, and a scar was left.  This process took approximately one month.  She states that she has had several other individual lesions and locations including her buttocks, inner thigh area, neck, and scalp.  She is followed by [facility] for eczema, however she states that she has not been in recently to have a lesion biopsy performed.  Colleen Baker also complains of intermittent episodes of dyspnea and chest tightness.  She tends to experience these lower respiratory symptoms more frequently when she is outdoors.  She experiences frequent nasal congestion, rhinorrhea, sneezing, postnasal drainage, as well as occasional sinus pressure over the forehead and cheekbones. No significant seasonal symptom variation has been noted nor have specific environmental triggers been identified.   Assessment and plan: Dermatitis Unclear etiology. This does not appear to be urticaria, contact dermatitis, or atopic dermatitis. Food allergen skin tests were negative today despite a positive histamine control.  A prescription has been provided for triamcinolone 0.1% cream sparingly to affected areas twice daily as needed.  To alleviate pruritus, iInstructions have been provided and discussed for H1/H2 receptor blockade with step-wise increase/decrease to find lowest effective dose.  Dermatology evaluation with biopsy of  an active lesion is recommended.  Perennial allergic rhinitis  Aeroallergen avoidance measures have been discussed and provided in written form.  A prescription has been provided for levocetirizine, 5mg  daily as needed.  A prescription has been provided for fluticasone nasal spray, 2 sprays per nostril daily as needed. Proper nasal spray technique has been discussed and demonstrated.  I have also recommended nasal saline spray (i.e., Simply Saline) or nasal saline lavage (i.e., NeilMed) as needed prior to medicated nasal sprays.  If allergen avoidance measures and medications fail to adequately relieve symptoms, aeroallergen immunotherapy will be considered.  Dyspnea The patient's history suggests asthma, however spirometry today does not meet the ATS criteria for that diagnosis.  A prescription has been provided for ProAir Respiclick, 1-2 inhalations every 4-6 hours as needed.  Subjective and objective measures of pulmonary function will be followed and the treatment plan will be adjusted accordingly.  Atopic dermatitis  Continue appropriate skin care measures and other recommendations per [facility].   Meds ordered this encounter  Medications  . Albuterol Sulfate (PROAIR RESPICLICK) 123XX123 (90 Base) MCG/ACT AEPB    Sig: Inhale 2 puffs into the lungs every 4 (four) hours as needed.    Dispense:  1 each    Refill:  2  . levocetirizine (XYZAL) 5 MG tablet    Sig: Take 1 tablet (5 mg total) by mouth every evening.    Dispense:  30 tablet    Refill:  5  . fluticasone (FLONASE) 50 MCG/ACT nasal spray    Sig: Place 2 sprays into both nostrils daily as needed for allergies or rhinitis.    Dispense:  16 g    Refill:  5  . triamcinolone cream (KENALOG) 0.1 %  Sig: Use sparingly to affected ares twice daily as needed    Dispense:  30 g    Refill:  3    Diagnositics: Spirometry: FVC was 2.94 L and FEV1 was 2.83 L with 150 mL (5%) post bronchodilator improvement.    Please see scanned spirometry results for details. Epicutaneous testing:  Negative despite a positive histamine control. Intradermal testing: Positive to molds, cockroach antigen, and dust mite antigen. Food allergen skin testing:  Negative despite a positive histamine control.    Physical examination: Blood pressure (!) 158/90, pulse 74, temperature 98.6 F (37 C), temperature source Oral, resp. rate 16, height 5\' 10"  (1.778 m), weight 167 lb 3.2 oz (75.8 kg).  General: Alert, interactive, in no acute distress. HEENT: TMs pearly gray, turbinates moderately edematous without discharge, post-pharynx moderately erythematous. Neck: Supple without lymphadenopathy. Lungs: Clear to auscultation without wheezing, rhonchi or rales. CV: Normal S1, S2 without murmurs. Abdomen: Nondistended, nontender. Skin: 5mmx7mm hyperpigmented area on the right chest. Extremities:  No clubbing, cyanosis or edema. Neuro:   Grossly intact.  Review of systems:  Review of systems negative except as noted in HPI / PMHx or noted below: Review of Systems  Constitutional: Negative.   HENT: Negative.   Eyes: Negative.   Respiratory: Negative.   Cardiovascular: Negative.   Gastrointestinal: Negative.   Genitourinary: Negative.   Musculoskeletal: Negative.   Skin: Negative.   Neurological: Negative.   Endo/Heme/Allergies: Negative.   Psychiatric/Behavioral: Negative.     Past medical history:  Past Medical History:  Diagnosis Date  . Arthritis   . Asthma with allergic rhinitis   . Crohn's disease in remission ([place])   . Eczema   . Heart murmur    asymptomatic   . History of abnormal cervical Pap smear   . History of kidney stones   . Hypertension   . Irritable bowel   . Uterine fibroid     Past surgical history:  Past Surgical History:  Procedure Laterality Date  . CESAREAN SECTION  08/31/2011   Procedure: CESAREAN SECTION;  Surgeon: Farrel Gobble. Harrington Challenger, MD;  Location: [facility];  Service: Gynecology;   Laterality: N/A;  . COLONOSCOPY  last one 2015  . DILATATION & CURETTAGE/HYSTEROSCOPY WITH MYOSURE N/A 01/13/2015   Procedure: DILATATION & CURETTAGE/HYSTEROSCOPY WITH  MYOSURE;  Surgeon: Vanessa Kick, MD;  Location: [facility];  Service: Gynecology;  Laterality: N/A;  . LEEP  1990's  . MYOMECTOMY ABDOMINAL APPROACH  02-15-2005  . ROBOT ASSISTED MYOMECTOMY  03/ 2012    Family history: Family History  Problem Relation Age of Onset  . Hypertension Mother   . Asthma Mother   . Bronchitis Mother   . Kidney disease Father   . Asthma Father   . Eczema Sister   . Sinusitis Sister   . Asthma Maternal Grandmother   . Eczema Maternal Grandmother   . Bronchitis Maternal Grandmother   . Allergic rhinitis Son   . Eczema Son   . Urticaria Neg Hx   . Immunodeficiency Neg Hx   . Angioedema Neg Hx     Social history: Social History   Social History  . Marital status: Single    Spouse name: N/A  . Number of children: N/A  . Years of education: N/A   Occupational History  . Not on file.   Social History Main Topics  . Smoking status: Never Smoker  . Smokeless tobacco: Never Used  . Alcohol use Yes     Comment: rare  .  Drug use: No  . Sexual activity: Yes    Birth control/ protection: Pill   Other Topics Concern  . Not on file   Social History Narrative  . No narrative on file   Environmental History: The patient lives in a 40 year old house with carpeting throughout and central air/heat.  She is a nonsmoker without pets.    Medication List       Accurate as of 01/06/16  1:33 PM. Always use your most recent med list.          Albuterol Sulfate 108 (90 Base) MCG/ACT Aepb Commonly known as:  PROAIR RESPICLICK Inhale 2 puffs into the lungs every 4 (four) hours as needed.   atorvastatin 20 MG tablet Commonly known as:  LIPITOR Take 20 mg by mouth.   cetirizine 10 MG tablet Commonly known as:  ZYRTEC Take 10 mg by mouth daily as needed.   ferrous  sulfate 325 (65 FE) MG EC tablet Take 325 mg by mouth 3 (three) times daily with meals.   fluticasone 50 MCG/ACT nasal spray Commonly known as:  FLONASE Place 2 sprays into both nostrils daily as needed for allergies or rhinitis.   ibuprofen 600 MG tablet Commonly known as:  ADVIL,MOTRIN Take 1 tablet (600 mg total) by mouth every 6 (six) hours as needed.   levocetirizine 5 MG tablet Commonly known as:  XYZAL Take 1 tablet (5 mg total) by mouth every evening.   metoprolol succinate 100 MG 24 hr tablet Commonly known as:  TOPROL-XL Take 100 mg by mouth every morning. Take with or immediately following a meal.   oxyCODONE-acetaminophen 5-325 MG tablet Commonly known as:  ROXICET Take 1-2 tablets by mouth every 4 (four) hours as needed. May take 1-2 tablets every 4-6 hours as needed for pain   triamcinolone cream 0.1 % Commonly known as:  KENALOG Use sparingly to affected ares twice daily as needed       Known medication allergies: Allergies  Allergen Reactions  . Aciphex [Rabeprazole Sodium] Hives  . Lisinopril Swelling    Facial     I appreciate the opportunity to take part in Arrianna's care. Please do not hesitate to contact me with questions.  Sincerely,   R. Edgar Frisk, MD

## 2016-01-06 NOTE — Assessment & Plan Note (Signed)
The patient's history suggests asthma, however spirometry today does not meet the ATS criteria for that diagnosis.  A prescription has been provided for ProAir Respiclick, 1-2 inhalations every 4-6 hours as needed.  Subjective and objective measures of pulmonary function will be followed and the treatment plan will be adjusted accordingly.

## 2016-02-04 DIAGNOSIS — E782 Mixed hyperlipidemia: Secondary | ICD-10-CM | POA: Insufficient documentation

## 2016-04-03 DIAGNOSIS — M654 Radial styloid tenosynovitis [de Quervain]: Secondary | ICD-10-CM | POA: Insufficient documentation

## 2016-04-03 DIAGNOSIS — G5603 Carpal tunnel syndrome, bilateral upper limbs: Secondary | ICD-10-CM | POA: Insufficient documentation

## 2016-05-11 ENCOUNTER — Ambulatory Visit: Payer: 59 | Admitting: Allergy and Immunology

## 2016-06-07 ENCOUNTER — Ambulatory Visit: Payer: 59 | Admitting: Allergy and Immunology

## 2016-11-10 ENCOUNTER — Other Ambulatory Visit: Payer: Self-pay

## 2016-11-10 DIAGNOSIS — L309 Dermatitis, unspecified: Secondary | ICD-10-CM

## 2016-11-10 DIAGNOSIS — J3089 Other allergic rhinitis: Secondary | ICD-10-CM

## 2016-11-13 ENCOUNTER — Other Ambulatory Visit: Payer: Self-pay

## 2016-11-13 DIAGNOSIS — J3089 Other allergic rhinitis: Secondary | ICD-10-CM

## 2016-11-13 DIAGNOSIS — L309 Dermatitis, unspecified: Secondary | ICD-10-CM

## 2017-01-05 DIAGNOSIS — Z860100 Personal history of colon polyps, unspecified: Secondary | ICD-10-CM | POA: Insufficient documentation

## 2017-04-03 DIAGNOSIS — M180 Bilateral primary osteoarthritis of first carpometacarpal joints: Secondary | ICD-10-CM | POA: Insufficient documentation

## 2018-04-25 DIAGNOSIS — M5412 Radiculopathy, cervical region: Secondary | ICD-10-CM | POA: Insufficient documentation

## 2018-06-25 ENCOUNTER — Other Ambulatory Visit: Payer: Self-pay | Admitting: Obstetrics and Gynecology

## 2018-06-25 DIAGNOSIS — M792 Neuralgia and neuritis, unspecified: Secondary | ICD-10-CM | POA: Insufficient documentation

## 2018-07-02 NOTE — Progress Notes (Signed)
Patient returned call. Per patient , she is asymptomatic. 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 Per stacy , dr Philis Pique will be made aware

## 2018-07-02 NOTE — Progress Notes (Signed)
lvmm regarding COVID-19 screening precautions

## 2018-07-02 NOTE — H&P (Signed)
42 y.o. G2P1011 complains of symptomatic fibroid uterus.  Previously:" Pt. here to discuss hysterectomy, had [place] here on 05/07/2018; Nexplanon inserted 01/10/16; Pt. had D&C hysteroscopy on 01/13/15, also has had 2 fibroid surgeries in the past; Pt. intermittent sharp LLQ pain, started years ago; c/o bloating, irregular bleeding /tb//  Per Dr. Ross:"TA/TVUS: RF uterus with multiple uterine fibroids. Endometrial thickness 1 cm. At least 5 fibroids measured. Overall, interval increase in volume of each fibroid since last [place] in 2018. ROV normal appearing. LOV with 5 cm simple cyst. No FF in CDS. - Management options dicussed. At this time patient would like to pursue hysterectomy. Will refer to Dr. Philis Pique"  [place] 14x8x6 cm; multiple fibroids, largest 5 cm. LO 5 cm simple cyst, normal blood flow.  Sx worsening- now mostly sharp pain in LLQ.  Periods with Nexplanon- random. 1:3 are normal to light, the others are heavy, clots, lasting 7-10 days.  S/p myomectomy open; laparoscopic myomectomy. "  Past Medical History:  Diagnosis Date  . Arthritis   . Asthma with allergic rhinitis   . Crohn's disease in remission ([place])   . Eczema   . Heart murmur    asymptomatic   . History of abnormal cervical Pap smear   . History of kidney stones   . Hypertension   . Irritable bowel   . Uterine fibroid    Past Surgical History:  Procedure Laterality Date  . CESAREAN SECTION  08/31/2011   Procedure: CESAREAN SECTION;  Surgeon: Farrel Gobble. Harrington Challenger, MD;  Location: [facility];  Service: Gynecology;  Laterality: N/A;  . COLONOSCOPY  last one 2015  . DILATATION & CURETTAGE/HYSTEROSCOPY WITH MYOSURE N/A 01/13/2015   Procedure: DILATATION & CURETTAGE/HYSTEROSCOPY WITH  MYOSURE;  Surgeon: Vanessa Kick, MD;  Location: [facility];  Service: Gynecology;  Laterality: N/A;  . LEEP  1990's  . MYOMECTOMY ABDOMINAL APPROACH  02-15-2005  . ROBOT ASSISTED MYOMECTOMY  03/ 2012    Social History   Socioeconomic  History  . Marital status: Single    Spouse name: Not on file  . Number of children: Not on file  . Years of education: Not on file  . Highest education level: Not on file  Occupational History  . Not on file  Social Needs  . Financial resource strain: Not on file  . Food insecurity:    Worry: Not on file    Inability: Not on file  . Transportation needs:    Medical: Not on file    Non-medical: Not on file  Tobacco Use  . Smoking status: Never Smoker  . Smokeless tobacco: Never Used  Substance and Sexual Activity  . Alcohol use: Yes    Comment: rare  . Drug use: No  . Sexual activity: Yes    Birth control/protection: Pill  Lifestyle  . Physical activity:    Days per week: Not on file    Minutes per session: Not on file  . Stress: Not on file  Relationships  . Social connections:    Talks on phone: Not on file    Gets together: Not on file    Attends religious service: Not on file    Active member of club or organization: Not on file    Attends meetings of clubs or organizations: Not on file    Relationship status: Not on file  . Intimate partner violence:    Fear of current or ex partner: Not on file    Emotionally abused: Not on file  Physically abused: Not on file    Forced sexual activity: Not on file  Other Topics Concern  . Not on file  Social History Narrative  . Not on file    No current facility-administered medications on file prior to encounter.    Current Outpatient Medications on File Prior to Encounter  Medication Sig Dispense Refill  . Albuterol Sulfate (PROAIR RESPICLICK) 725 (90 Base) MCG/ACT AEPB Inhale 2 puffs into the lungs every 4 (four) hours as needed. 1 each 2  . budesonide-formoterol (SYMBICORT) 160-4.5 MCG/ACT inhaler Inhale 2 puffs into the lungs daily.    Marland Kitchen diltiazem 2 % GEL Apply 1 application topically 2 (two) times daily.    Marland Kitchen esomeprazole (NEXIUM) 40 MG capsule Take 40 mg by mouth 2 (two) times daily before a meal.    .  etodolac (LODINE) 400 MG tablet Take 400 mg by mouth 2 (two) times daily.    Marland Kitchen etonogestrel (NEXPLANON) 68 MG IMPL implant 68 mg by Subdermal route once.    . ferrous sulfate 325 (65 FE) MG tablet Take 650 mg by mouth daily with breakfast.     . fluticasone (FLONASE) 50 MCG/ACT nasal spray Place 2 sprays into both nostrils daily as needed for allergies or rhinitis. 16 g 5  . Ginger, Zingiber officinalis, (GINGER ROOT) 550 MG CAPS Take 550 mg by mouth daily.    . mesalamine (LIALDA) 1.2 g EC tablet Take 2.4 g by mouth daily with breakfast.    . metoprolol succinate (TOPROL-XL) 100 MG 24 hr tablet Take 100 mg by mouth every morning. Take with or immediately following a meal.    . montelukast (SINGULAIR) 10 MG tablet Take 10 mg by mouth at bedtime.    . triamcinolone cream (KENALOG) 0.1 % Use sparingly to affected ares twice daily as needed (Patient taking differently: Apply 1 application topically 2 (two) times daily as needed (itching). ) 30 g 3  . Turmeric 500 MG TABS Take 500 mg by mouth daily.    Marland Kitchen ibuprofen (ADVIL,MOTRIN) 600 MG tablet Take 1 tablet (600 mg total) by mouth every 6 (six) hours as needed. (Patient not taking: Reported on 06/26/2018) 90 tablet 0  . levocetirizine (XYZAL) 5 MG tablet Take 1 tablet (5 mg total) by mouth every evening. (Patient not taking: Reported on 06/26/2018) 30 tablet 5  . oxyCODONE-acetaminophen (ROXICET) 5-325 MG tablet Take 1-2 tablets by mouth every 4 (four) hours as needed. May take 1-2 tablets every 4-6 hours as needed for pain (Patient not taking: Reported on 01/06/2016) 30 tablet 0    Allergies  Allergen Reactions  . Aciphex [Rabeprazole Sodium] Hives  . Lisinopril Swelling    Facial   . Iodinated Diagnostic Agents Itching and Hives    There were no vitals filed for this visit.  Lungs: clear to ascultation Cor:  RRR Abdomen:  soft, nontender, nondistended. Ex:  no cords, erythema Pelvic:   .   Vulva: no masses, no atrophy, no lesions .   Vagina:  no tenderness, no erythema, no abnormal vaginal discharge, no vesicle(s) or ulcers, no cystocele, no rectocele .   Cervix: grossly normal, no discharge, no cervical motion tenderness .   Uterus: normal shape, midline, no uterine prolapse, mobile, non-tender, enlarged (14, regular shape and mobile) .   Bladder/Urethra: normal meatus, no urethral discharge, no urethral mass, bladder non distended, Urethra well supported .   Adnexa/Parametria: no parametrial tenderness, no parametrial mass, no adnexal tenderness, no ovarian mass  A:  Pt desires  definitive.  Acknowleges that previous surgeries may make robotic surgery not an option if excessive adhesions and pt desires open procedure if so.  Will maintain ovaries if normal (will look at LO abd decide during op if needs to come out). For Robo TLH, bilateral salpingectomies, cysto, possible BSO, possible open. Will also remove Nexplanon after surgery done.    P: P: All risks, benefits and alternatives d/w patient and she desires to proceed.  Patient has undergone a modified diet, ERAS protocol and will receive preop antibiotics and SCDs during the operation.   Pt to have extended recovery but will go home same day if eating, ambulating, voiding and pain control is good.  Daria Pastures

## 2018-07-03 ENCOUNTER — Encounter (HOSPITAL_COMMUNITY)
Admission: RE | Admit: 2018-07-03 | Discharge: 2018-07-03 | Disposition: A | Discharge status: Home / Self Care | Payer: 59 | Source: Ambulatory Visit | Attending: Nurse Practitioner | Admitting: Nurse Practitioner

## 2018-07-05 NOTE — Patient Instructions (Addendum)
Colleen Baker     Your procedure is scheduled on: 07-10-2018  Report to [facility]  Entrance  Report to short stay at 530 AM    Call this number if you have problems the morning of surgery [phone]   Remember:  [place], NO [place].  NO SOLID FOOD AFTER MIDNIGHT THE NIGHT PRIOR TO SURGERY. NOTHING BY MOUTH EXCEPT CLEAR LIQUIDS UNTIL 3 HOURS PRIOR TO [place] SURGERY. PLEASE FINISH ENSURE DRINK PER SURGEON ORDER 3 HOURS PRIOR TO SCHEDULED SURGERY TIME WHICH NEEDS TO BE COMPLETED AT 430 am.    CLEAR LIQUID DIET   Foods Allowed                                                                     Foods Excluded  Coffee and tea, regular and decaf                             liquids that you cannot  Plain Jell-O in any flavor                                             see through such as: Fruit ices (not with fruit pulp)                                     milk, soups, orange juice  Iced Popsicles                                    All solid food Carbonated beverages, regular and diet                                    Cranberry, grape and apple juices Sports drinks like Gatorade Lightly seasoned clear broth or consume(fat free) Sugar, honey syrup  Sample Menu Breakfast                                Lunch                                     Supper Cranberry juice                    Beef broth                            Chicken broth Jell-O  Grape juice                           Apple juice Coffee or tea                        Jell-O                                      Popsicle                                                Coffee or tea                        Coffee or tea  _____________________________________________________________________    Take these medicines the morning of surgery with A SIP OF WATER: proair inhaler if needed and bring  inhaler, symbicort if nned and bring inhaler, esomeprazole (nexium)              You may not have any metal on your body including hair pins and              piercings  Do not wear jewelry, make-up, lotions, powders or perfumes, deodorant             Do not wear nail polish.  Do not shave  48 hours prior to surgery.                Do not bring valuables to the hospital. [place].  Contacts, dentures or bridgework may not be worn into surgery.  Leave suitcase in the car. After surgery it may be brought to your room.     _____________________________________________________________________ [facility] - Preparing for Surgery Before surgery, you can play an important role.  Because skin is not sterile, your skin needs to be as free of germs as possible.  You can reduce the number of germs on your skin by washing with CHG (chlorahexidine gluconate) soap before surgery.  CHG is an antiseptic cleaner which kills germs and bonds with the skin to continue killing germs even after washing. Please DO NOT use if you have an allergy to CHG or antibacterial soaps.  If your skin becomes reddened/irritated stop using the CHG and inform your nurse when you arrive at Short Stay. Do not shave (including legs and underarms) for at least 48 hours prior to the first CHG shower.  You may shave your face/neck. Please follow these instructions carefully:  1.  Shower with CHG Soap the night before surgery and the  morning of Surgery.  2.  If you choose to wash your hair, wash your hair first as usual with your  normal  shampoo.  3.  After you shampoo, rinse your hair and body thoroughly to remove the  shampoo.                           4.  Use CHG as you would any other liquid soap.  You can apply chg directly  to the skin and wash  Gently with a scrungie or clean washcloth.  5.  Apply the CHG Soap to your body ONLY FROM THE NECK DOWN.   Do not use  on face/ open                           Wound or open sores. Avoid contact with eyes, ears mouth and genitals (private parts).                       Wash face,  Genitals (private parts) with your normal soap.             6.  Wash thoroughly, paying special attention to the area where your surgery  will be performed.  7.  Thoroughly rinse your body with warm water from the neck down.  8.  DO NOT shower/wash with your normal soap after using and rinsing off  the CHG Soap.                9.  Pat yourself dry with a clean towel.            10.  Wear clean pajamas.            11.  Place clean sheets on your bed the night of your first shower and do not  sleep with pets. Day of Surgery : Do not apply any lotions/deodorants the morning of surgery.  Please wear clean clothes to the hospital/surgery center.  FAILURE TO FOLLOW THESE INSTRUCTIONS MAY RESULT IN THE CANCELLATION OF YOUR SURGERY PATIENT SIGNATURE_________________________________  NURSE SIGNATURE__________________________________  ________________________________________________________________________   Colleen Baker  An incentive spirometer is a tool that can help keep your lungs clear and active. This tool measures how well you are filling your lungs with each breath. Taking long deep breaths may help reverse or decrease the chance of developing breathing (pulmonary) problems (especially infection) following:  A long period of time when you are unable to move or be active. BEFORE THE PROCEDURE   If the spirometer includes an indicator to show your best effort, your nurse or respiratory therapist will set it to a desired goal.  If possible, sit up straight or lean slightly forward. Try not to slouch.  Hold the incentive spirometer in an upright position. INSTRUCTIONS FOR USE  1. Sit on the edge of your bed if possible, or sit up as far as you can in bed or on a chair. 2. Hold the incentive spirometer in an upright  position. 3. Breathe out normally. 4. Place the mouthpiece in your mouth and seal your lips tightly around it. 5. Breathe in slowly and as deeply as possible, raising the piston or the ball toward the top of the column. 6. Hold your breath for 3-5 seconds or for as long as possible. Allow the piston or ball to fall to the bottom of the column. 7. Remove the mouthpiece from your mouth and breathe out normally. 8. Rest for a few seconds and repeat Steps 1 through 7 at least 10 times every 1-2 hours when you are awake. Take your time and take a few normal breaths between deep breaths. 9. The spirometer may include an indicator to show your best effort. Use the indicator as a goal to work toward during each repetition. 10. After each set of 10 deep breaths, practice coughing to be sure your lungs are clear. If you have an incision (the cut made at the time of  surgery), support your incision when coughing by placing a pillow or rolled up towels firmly against it. Once you are able to get out of bed, walk around indoors and cough well. You may stop using the incentive spirometer when instructed by your caregiver.  RISKS AND COMPLICATIONS  Take your time so you do not get dizzy or light-headed.  If you are in pain, you may need to take or ask for pain medication before doing incentive spirometry. It is harder to take a deep breath if you are having pain. AFTER USE  Rest and breathe slowly and easily.  It can be helpful to keep track of a log of your progress. Your caregiver can provide you with a simple table to help with this. If you are using the spirometer at home, follow these instructions: [place] IF:   You are having difficultly using the spirometer.  You have trouble using the spirometer as often as instructed.  Your pain medication is not giving enough relief while using the spirometer.  You develop fever of 100.5 F (38.1 C) or higher. SEEK IMMEDIATE MEDICAL CARE IF:    You cough up bloody sputum that had not been present before.  You develop fever of 102 F (38.9 C) or greater.  You develop worsening pain at or near the incision site. MAKE SURE YOU:   Understand these instructions.  Will watch your condition.  Will get help right away if you are not doing well or get worse. Document Released: 08/14/2006 Document Revised: 06/26/2011 Document Reviewed: 10/15/2006 ExitCare Patient Information 2014 ExitCare, [place].   ________________________________________________________________________  WHAT IS A BLOOD TRANSFUSION? Blood Transfusion Information  A transfusion is the replacement of blood or some of its parts. Blood is made up of multiple cells which provide different functions.  Red blood cells carry oxygen and are used for blood loss replacement.  White blood cells fight against infection.  Platelets control bleeding.  Plasma helps clot blood.  Other blood products are available for specialized needs, such as hemophilia or other clotting disorders. BEFORE THE TRANSFUSION  Who gives blood for transfusions?   Healthy volunteers who are fully evaluated to make sure their blood is safe. This is blood bank blood. Transfusion therapy is the safest it has ever been in the practice of medicine. Before blood is taken from a donor, a complete history is taken to make sure that person has no history of diseases nor engages in risky social behavior (examples are intravenous drug use or sexual activity with multiple partners). The donor's travel history is screened to minimize risk of transmitting infections, such as malaria. The donated blood is tested for signs of infectious diseases, such as HIV and hepatitis. The blood is then tested to be sure it is compatible with you in order to minimize the chance of a transfusion reaction. If you or a relative donates blood, this is often done in anticipation of surgery and is not appropriate for emergency  situations. It takes many days to process the donated blood. RISKS AND COMPLICATIONS Although transfusion therapy is very safe and saves many lives, the main dangers of transfusion include:   Getting an infectious disease.  Developing a transfusion reaction. This is an allergic reaction to something in the blood you were given. Every precaution is taken to prevent this. The decision to have a blood transfusion has been considered carefully by your caregiver before blood is given. Blood is not given unless the benefits outweigh the risks. AFTER THE  TRANSFUSION  Right after receiving a blood transfusion, you will usually feel much better and more energetic. This is especially true if your red blood cells have gotten low (anemic). The transfusion raises the level of the red blood cells which carry oxygen, and this usually causes an energy increase.  The nurse administering the transfusion will monitor you carefully for complications. HOME CARE INSTRUCTIONS  No special instructions are needed after a transfusion. You may find your energy is better. Speak with your caregiver about any limitations on activity for underlying diseases you may have. SEEK MEDICAL CARE IF:   Your condition is not improving after your transfusion.  You develop redness or irritation at the intravenous (IV) site. SEEK IMMEDIATE MEDICAL CARE IF:  Any of the following symptoms occur over the next 12 hours:  Shaking chills.  You have a temperature by mouth above 102 F (38.9 C), not controlled by medicine.  Chest, back, or muscle pain.  People around you feel you are not acting correctly or are confused.  Shortness of breath or difficulty breathing.  Dizziness and fainting.  You get a rash or develop hives.  You have a decrease in urine output.  Your urine turns a dark color or changes to pink, red, or brown. Any of the following symptoms occur over the next 10 days:  You have a temperature by mouth above  102 F (38.9 C), not controlled by medicine.  Shortness of breath.  Weakness after normal activity.  The white part of the eye turns yellow (jaundice).  You have a decrease in the amount of urine or are urinating less often.  Your urine turns a dark color or changes to pink, red, or brown. Document Released: 03/31/2000 Document Revised: 06/26/2011 Document Reviewed: 11/18/2007 [facility] Patient Information 2014 [place], [place].  _______________________________________________________________________

## 2018-07-08 ENCOUNTER — Encounter (HOSPITAL_COMMUNITY): Payer: Self-pay

## 2018-07-08 ENCOUNTER — Encounter (HOSPITAL_COMMUNITY)
Admission: RE | Admit: 2018-07-08 | Discharge: 2018-07-08 | Disposition: A | Discharge status: Home / Self Care | Payer: 59 | Source: Ambulatory Visit | Attending: Obstetrics and Gynecology | Admitting: Obstetrics and Gynecology

## 2018-07-08 ENCOUNTER — Other Ambulatory Visit: Payer: Self-pay

## 2018-07-08 ENCOUNTER — Encounter (HOSPITAL_COMMUNITY): Payer: Self-pay | Admitting: Physician Assistant

## 2018-07-08 DIAGNOSIS — R9431 Abnormal electrocardiogram [ECG] [EKG]: Secondary | ICD-10-CM | POA: Diagnosis not present

## 2018-07-08 DIAGNOSIS — Z01818 Encounter for other preprocedural examination: Secondary | ICD-10-CM | POA: Diagnosis not present

## 2018-07-08 DIAGNOSIS — D259 Leiomyoma of uterus, unspecified: Secondary | ICD-10-CM | POA: Diagnosis not present

## 2018-07-08 DIAGNOSIS — N920 Excessive and frequent menstruation with regular cycle: Secondary | ICD-10-CM | POA: Diagnosis not present

## 2018-07-08 DIAGNOSIS — I10 Essential (primary) hypertension: Secondary | ICD-10-CM | POA: Diagnosis not present

## 2018-07-08 HISTORY — DX: Anxiety disorder, unspecified: F41.9

## 2018-07-08 HISTORY — DX: Headache, unspecified: R51.9

## 2018-07-08 HISTORY — DX: Headache: R51

## 2018-07-08 HISTORY — DX: Gastro-esophageal reflux disease without esophagitis: K21.9

## 2018-07-08 HISTORY — DX: Injury of unspecified nerves of neck, initial encounter: S14.9XXA

## 2018-07-08 HISTORY — DX: Mononeuropathy, unspecified: G58.9

## 2018-07-08 HISTORY — DX: Major depressive disorder, single episode, unspecified: F32.9

## 2018-07-08 HISTORY — DX: Depression, unspecified: F32.A

## 2018-07-08 LAB — BASIC METABOLIC PANEL
Anion gap: 9 (ref 5–15)
BUN: 9 mg/dL (ref 6–20)
CO2: 26 mmol/L (ref 22–32)
Calcium: 8.7 mg/dL — ABNORMAL LOW (ref 8.9–10.3)
Chloride: 101 mmol/L (ref 98–111)
Creatinine, Ser: 0.82 mg/dL (ref 0.44–1.00)
GFR calc Af Amer: >60 mL/min (ref >60)
Glucose, Bld: 93 mg/dL (ref 70–99)
Potassium: 3.1 mmol/L — ABNORMAL LOW (ref 3.5–5.1)
Sodium: 136 mmol/L (ref 135–145)

## 2018-07-08 LAB — CBC
HCT: 35.6 % — ABNORMAL LOW (ref 36.0–46.0)
Hemoglobin: 11.1 g/dL — ABNORMAL LOW (ref 12.0–15.0)
MCH: 25.3 pg — ABNORMAL LOW (ref 26.0–34.0)
MCHC: 31.2 g/dL (ref 30.0–36.0)
MCV: 81.3 fL (ref 80.0–100.0)
Platelets: 248 10*3/uL (ref 150–400)
RBC: 4.38 MIL/uL (ref 3.87–5.11)
RDW: 14.7 % (ref 11.5–15.5)
WBC: 6.5 10*3/uL (ref 4.0–10.5)
nRBC: 0 % (ref 0.0–0.2)

## 2018-07-08 NOTE — Progress Notes (Signed)
Anesthesia Chart Review   Case:  [id number] Date/Time:  07/10/18 0700   Procedures:      XI ROBOTIC ASSISTED LAPAROSCOPIC HYSTERECTOMY AND SALPINGECTOMY (Bilateral )     XI ROBOTIC ASSISTED BILATERAL SALPINGO OOPHORECTOMY (Bilateral )     HYSTERECTOMY ABDOMINAL (N/A )   Anesthesia type:  Choice   Pre-op diagnosis:  FIBROIDS   Location:  [facility] ROOM 02 / [facility]   Surgeon:  Bobbye Charleston, MD      DISCUSSION:43 yo never smoker with h/o HTN, Chron's disease, GERD, depression, anxiety, fibroids scheduled for above procedure 07/10/18 with Dr. Bobbye Charleston.   Pt can proceed with planned procedure barring acute status change.  VS: BP (!) 164/122   Pulse 70   Temp 37.1 C (Oral)   Ht 5\' 10"  (1.778 m)   Wt 85.7 kg   LMP 06/16/2018   SpO2 100%   BMI 27.12 kg/m   PROVIDERS: Daylene Posey, FNP is PCP    LABS: Labs reviewed: Acceptable for surgery. (all labs ordered are listed, but only abnormal results are displayed)  Labs Reviewed  CBC - Abnormal; Notable for the following components:      Result Value   Hemoglobin 11.1 (*)    HCT 35.6 (*)    MCH 25.3 (*)    All other components within normal limits  BASIC METABOLIC PANEL - Abnormal; Notable for the following components:   Potassium 3.1 (*)    Calcium 8.7 (*)    All other components within normal limits  TYPE AND SCREEN     IMAGES:   EKG: 07/08/2018 Rate 65 bpm Normal sinus rhythm  T wave abnormality, consider inferior ischemia T wave abnormality, consider anterolateral ischemia  Abnormal ECG No significant change since last tracing   CV:  Past Medical History:  Diagnosis Date  . Anemia   . Anxiety   . Arthritis   . Asthma with allergic rhinitis   . Crohn's disease in remission ([place])   . Depression   . Eczema   . GERD (gastroesophageal reflux disease)   . Headache   . Heart murmur    asymptomatic   . History of abnormal cervical Pap smear   . History of kidney stones   . Hypertension   . Irritable  bowel   . Pinched nerve in neck   . Uterine fibroid     Past Surgical History:  Procedure Laterality Date  . BUNIONECTOMY Left   . CESAREAN SECTION  08/31/2011   Procedure: CESAREAN SECTION;  Surgeon: Farrel Gobble. Harrington Challenger, MD;  Location: [facility];  Service: Gynecology;  Laterality: N/A;  . COLONOSCOPY  last one 2015  . DILATATION & CURETTAGE/HYSTEROSCOPY WITH MYOSURE N/A 01/13/2015   Procedure: DILATATION & CURETTAGE/HYSTEROSCOPY WITH  MYOSURE;  Surgeon: Vanessa Kick, MD;  Location: [facility];  Service: Gynecology;  Laterality: N/A;  . ganglion cyst removed Right   . hiatal hernia repair  as child  . LEEP  1990's  . MENISCUS REPAIR Right 6 yrs ago  . MYOMECTOMY ABDOMINAL APPROACH  02-15-2005  . ROBOT ASSISTED MYOMECTOMY  03/ 2012    MEDICATIONS: . DULoxetine (CYMBALTA) 30 MG capsule  . Albuterol Sulfate (PROAIR RESPICLICK) 194 (90 Base) MCG/ACT AEPB  . budesonide-formoterol (SYMBICORT) 160-4.5 MCG/ACT inhaler  . diltiazem 2 % GEL  . esomeprazole (NEXIUM) 40 MG capsule  . etodolac (LODINE) 400 MG tablet  . etonogestrel (NEXPLANON) 68 MG IMPL implant  . ferrous sulfate 325 (65 FE) MG tablet  . fluticasone (  FLONASE) 50 MCG/ACT nasal spray  . Ginger, Zingiber officinalis, (GINGER ROOT) 550 MG CAPS  . ibuprofen (ADVIL,MOTRIN) 600 MG tablet  . levocetirizine (XYZAL) 5 MG tablet  . mesalamine (LIALDA) 1.2 g EC tablet  . metoprolol succinate (TOPROL-XL) 100 MG 24 hr tablet  . montelukast (SINGULAIR) 10 MG tablet  . oxyCODONE-acetaminophen (ROXICET) 5-325 MG tablet  . triamcinolone cream (KENALOG) 0.1 %  . Turmeric 500 MG TABS   No current facility-administered medications for this encounter.      Maia Plan [facility] Pre-Surgical Testing [phone] 07/08/18 2:52 PM

## 2018-07-08 NOTE — Progress Notes (Signed)
Secure chat done with dr Philis Pique made aware potassium 3.1 other pre op labs ok, ekg abnormal but no change from 2016 ekg

## 2018-07-10 ENCOUNTER — Ambulatory Visit (HOSPITAL_COMMUNITY): Admission: RE | Admit: 2018-07-10 | Payer: 59 | Source: Home / Self Care | Admitting: Obstetrics and Gynecology

## 2018-07-10 ENCOUNTER — Encounter (HOSPITAL_COMMUNITY): Admission: RE | Payer: Self-pay | Source: Home / Self Care

## 2018-07-10 LAB — TYPE AND SCREEN
ABO/RH(D): O POS
Antibody Screen: NEGATIVE

## 2018-07-10 SURGERY — XI ROBOTIC ASSISTED LAPAROSCOPIC HYSTERECTOMY AND SALPINGECTOMY
Anesthesia: Choice

## 2018-10-22 ENCOUNTER — Other Ambulatory Visit: Payer: Self-pay | Admitting: Obstetrics and Gynecology

## 2018-10-26 ENCOUNTER — Other Ambulatory Visit (HOSPITAL_COMMUNITY): Payer: 59

## 2018-10-28 NOTE — Progress Notes (Signed)
EKG 07-08-18 EPIC

## 2018-10-28 NOTE — Patient Instructions (Addendum)
YOU ARE SCHEDULED FOR A COVID TEST _________@____________ . THIS TEST MUST BE DONE BEFORE SURGERY. GO TO [place] ENTRANCE AND REMAIN IN YOUR CAR, THIS IS A DRIVE UP TEST. ONCE YOUR COVID TEST IS DONE PLEASE FOLLOW ALL THE QUARANTINE  INSTRUCTIONS GIVEN IN YOUR HANDOUT.    Your procedure is scheduled on 11-06-2018   Report to [facility]   Call this number if you have problems the morning of surgery  :[phone].   OUR ADDRESS IS [place].  WE ARE LOCATED IN THE [facility]                                   [facility].                                     REMEMBER:  DO NOT EAT FOOD OR DRINK LIQUIDS AFTER MIDNIGHT     TAKE THESE MEDICATIONS MORNING OF SURGERY WITH A SIP OF WATER:  _nexium, symbicort, albuterol bring with you_________________________________   YOU ARE SPENDING THE NIGHT AFTER SURGERY PLEASE BRING ALL YOUR PRESCRIPTION MEDICATIONS IN THEIR ORIGINAL BOTTLES. NO VISITORS ARE ALLOWED TO SPEND THE NIGHT.                                    DO NOT WEAR JEWERLY, MAKE UP, OR NAIL POLISH,  DO NOT WEAR LOTIONS, POWDERS, PERFUMES OR DEODORANT. DO NOT SHAVE FOR 24 HOURS PRIOR TO DAY OF SURGERY. MEN MAY SHAVE FACE AND NECK. CONTACTS, GLASSES, OR DENTURES MAY NOT BE WORN TO SURGERY.                                    [facility] IS NOT RESPONSIBLE  FOR ANY BELONGINGS.                                                                    Marland Kitchen         [facility] - Preparing for Surgery Before surgery, you can play an important role.  Because skin is not sterile, your skin needs to be as free of germs as possible.  You can reduce the number of germs on your skin by washing with CHG (chlorahexidine gluconate) soap before surgery.  CHG is an antiseptic cleaner which kills germs and bonds with the skin to continue killing germs even after washing. Please DO NOT use if you have an allergy to CHG or antibacterial soaps.  If your  skin becomes reddened/irritated stop using the CHG and inform your nurse when you arrive at Short Stay. Do not shave (including legs and underarms) for at least 48 hours prior to the first CHG shower.  You may shave your face/neck. Please follow these instructions carefully:  1.  Shower with CHG Soap the night before surgery and the  morning of Surgery.  2.  If you choose to wash your hair,  wash your hair first as usual with your  normal  shampoo.  3.  After you shampoo, rinse your hair and body thoroughly to remove the  shampoo.                           4.  Use CHG as you would any other liquid soap.  You can apply chg directly  to the skin and wash                       Gently with a scrungie or clean washcloth.  5.  Apply the CHG Soap to your body ONLY FROM THE NECK DOWN.   Do not use on face/ open                           Wound or open sores. Avoid contact with eyes, ears mouth and genitals (private parts).                       Wash face,  Genitals (private parts) with your normal soap.             6.  Wash thoroughly, paying special attention to the area where your surgery  will be performed.  7.  Thoroughly rinse your body with warm water from the neck down.  8.  DO NOT shower/wash with your normal soap after using and rinsing off  the CHG Soap.                9.  Pat yourself dry with a clean towel.            10.  Wear clean pajamas.            11.  Place clean sheets on your bed the night of your first shower and do not  sleep with pets. Day of Surgery : Do not apply any lotions/deodorants the morning of surgery.  Please wear clean clothes to the hospital/surgery center.  FAILURE TO FOLLOW THESE INSTRUCTIONS MAY RESULT IN THE CANCELLATION OF YOUR SURGERY PATIENT SIGNATURE_________________________________  NURSE SIGNATURE__________________________________  ________________________________________________________________________                        MAKE SURE YOU ARE WALKING AS  MUCH AS YOU ARE ABLE AND TURNING OFTEN IN BED AND DOING DEEP BREATHING EVERY HOUR WHILE AWAKE TO PREVENT PNEUMONIA AFTER SURGERY.

## 2018-10-29 ENCOUNTER — Other Ambulatory Visit: Payer: Self-pay

## 2018-10-29 ENCOUNTER — Encounter (HOSPITAL_COMMUNITY): Payer: Self-pay

## 2018-10-29 ENCOUNTER — Encounter (INDEPENDENT_AMBULATORY_CARE_PROVIDER_SITE_OTHER): Payer: Self-pay

## 2018-10-29 ENCOUNTER — Encounter (HOSPITAL_COMMUNITY)
Admission: RE | Admit: 2018-10-29 | Discharge: 2018-10-29 | Disposition: A | Discharge status: Home / Self Care | Payer: 59 | Source: Ambulatory Visit | Attending: Obstetrics and Gynecology | Admitting: Obstetrics and Gynecology

## 2018-10-29 DIAGNOSIS — K5090 Crohn's disease, unspecified, without complications: Secondary | ICD-10-CM | POA: Diagnosis not present

## 2018-10-29 DIAGNOSIS — M1990 Unspecified osteoarthritis, unspecified site: Secondary | ICD-10-CM | POA: Diagnosis not present

## 2018-10-29 DIAGNOSIS — Z01812 Encounter for preprocedural laboratory examination: Secondary | ICD-10-CM | POA: Diagnosis present

## 2018-10-29 DIAGNOSIS — M549 Dorsalgia, unspecified: Secondary | ICD-10-CM | POA: Diagnosis not present

## 2018-10-29 DIAGNOSIS — Z79899 Other long term (current) drug therapy: Secondary | ICD-10-CM | POA: Insufficient documentation

## 2018-10-29 DIAGNOSIS — F419 Anxiety disorder, unspecified: Secondary | ICD-10-CM | POA: Insufficient documentation

## 2018-10-29 DIAGNOSIS — G8929 Other chronic pain: Secondary | ICD-10-CM | POA: Diagnosis not present

## 2018-10-29 DIAGNOSIS — Z7901 Long term (current) use of anticoagulants: Secondary | ICD-10-CM | POA: Insufficient documentation

## 2018-10-29 DIAGNOSIS — K219 Gastro-esophageal reflux disease without esophagitis: Secondary | ICD-10-CM | POA: Insufficient documentation

## 2018-10-29 DIAGNOSIS — J45909 Unspecified asthma, uncomplicated: Secondary | ICD-10-CM | POA: Insufficient documentation

## 2018-10-29 DIAGNOSIS — I10 Essential (primary) hypertension: Secondary | ICD-10-CM | POA: Diagnosis not present

## 2018-10-29 DIAGNOSIS — D259 Leiomyoma of uterus, unspecified: Secondary | ICD-10-CM | POA: Insufficient documentation

## 2018-10-29 DIAGNOSIS — F329 Major depressive disorder, single episode, unspecified: Secondary | ICD-10-CM | POA: Insufficient documentation

## 2018-10-29 DIAGNOSIS — R011 Cardiac murmur, unspecified: Secondary | ICD-10-CM | POA: Diagnosis not present

## 2018-10-29 HISTORY — DX: Other chronic pain: M54.9

## 2018-10-29 HISTORY — DX: Other chronic pain: G89.29

## 2018-10-29 LAB — CBC
HCT: 34.6 % — ABNORMAL LOW (ref 36.0–46.0)
Hemoglobin: 10.6 g/dL — ABNORMAL LOW (ref 12.0–15.0)
MCH: 25.6 pg — ABNORMAL LOW (ref 26.0–34.0)
MCHC: 30.6 g/dL (ref 30.0–36.0)
MCV: 83.6 fL (ref 80.0–100.0)
Platelets: 294 10*3/uL (ref 150–400)
RBC: 4.14 MIL/uL (ref 3.87–5.11)
RDW: 13.9 % (ref 11.5–15.5)
WBC: 8.4 10*3/uL (ref 4.0–10.5)
nRBC: 0 % (ref 0.0–0.2)

## 2018-10-29 LAB — BASIC METABOLIC PANEL
Anion gap: 10 (ref 5–15)
BUN: 17 mg/dL (ref 6–20)
CO2: 27 mmol/L (ref 22–32)
Calcium: 8.4 mg/dL — ABNORMAL LOW (ref 8.9–10.3)
Chloride: 101 mmol/L (ref 98–111)
Creatinine, Ser: 0.97 mg/dL (ref 0.44–1.00)
GFR calc Af Amer: >60 mL/min (ref >60)
GFR calc non Af Amer: >60 mL/min (ref >60)
Glucose, Bld: 101 mg/dL — ABNORMAL HIGH (ref 70–99)
Potassium: 2.6 mmol/L — CL (ref 3.5–5.1)
Sodium: 138 mmol/L (ref 135–145)

## 2018-10-29 NOTE — Progress Notes (Signed)
EKG from March 2020 shown to Audery Amel, NP. Pt. Has no cardiac symptoms at preop and no complaints of SOB upper back pain , CP . 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

## 2018-10-30 NOTE — Progress Notes (Signed)
Anesthesia Chart Review:   Case: [id number] Date/Time: 11/06/18 0659   Procedure: XI ROBOTIC ASSISTED LAPAROSCOPIC HYSTERECTOMY, SALPINGECTOMY AND possible SALPINGO OOPHORECTOMY (Bilateral )   Anesthesia type: Choice   Pre-op diagnosis: FIBROIDS   Location: [facility] OR ROOM .5 / [place]   Surgeon: Bobbye Charleston, MD      DISCUSSION:  Pt is a 43 year old female with hx HTN, asthma, Crohn's disease.   -Heart murmur listed in history but primary care exam 07/09/18 by Rudene Anda, MD documents "no murmur" (see care everywhere)  - EKG abnormal but appears to be stable over time.  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    VS: BP 140/86   Pulse 79   Temp 37.1 C (Oral)   Resp 16   Ht 5\' 10"  (1.778 m)   Wt 70.1 kg   SpO2 100%   BMI 22.17 kg/m    PROVIDERS: - PCP is Daylene Posey, FNP   LABS:  - K 2.6. This critical value called to [facility] in Dr. Malachi Carl office. Will recheck K day of surgery.   (all labs ordered are listed, but only abnormal results are displayed)  Labs Reviewed  CBC - Abnormal; Notable for the following components:      Result Value   Hemoglobin 10.6 (*)    HCT 34.6 (*)    MCH 25.6 (*)    All other components within normal limits  BASIC METABOLIC PANEL - Abnormal; Notable for the following components:   Potassium 2.6 (*)    Glucose, Bld 101 (*)    Calcium 8.4 (*)    All other components within normal limits    EKG 07/08/18: NSR. T wave abnormality, consider inferior ischemia. T wave abnormality, consider anterolateral ischemia. EKG appears stable when compared to pre-op EKG 01/13/15.  - Although I don't have tracings, by EKG reports in care everywhere EKG showed inferior and anterolateral T wave changes in 2018; T-wave inversion inferiorly and anteriorly also reported on 06/16/10 EKG.      Past Medical History:  Diagnosis Date  . Anemia     allergy induced  . Anxiety   . Arthritis   . Asthma with allergic rhinitis   . Chronic back pain   . Crohn's disease in remission ([place])   . Depression   . Eczema   . GERD (gastroesophageal reflux disease)   . Headache   . Heart murmur    asymptomatic   . History of abnormal cervical Pap smear   . History of kidney stones   . Hypertension   . Irritable bowel   . Pinched nerve in neck   . Uterine fibroid     Past Surgical History:  Procedure Laterality Date  . BUNIONECTOMY Left   . CESAREAN SECTION  08/31/2011   Procedure: CESAREAN SECTION;  Surgeon: Farrel Gobble. Harrington Challenger, MD;  Location: [facility];  Service: Gynecology;  Laterality: N/A;  . COLONOSCOPY  last one 2015  . DILATATION & CURETTAGE/HYSTEROSCOPY WITH MYOSURE N/A 01/13/2015   Procedure: DILATATION & CURETTAGE/HYSTEROSCOPY WITH  MYOSURE;  Surgeon: Vanessa Kick, MD;  Location: [facility];  Service: Gynecology;  Laterality: N/A;  . ganglion cyst removed Right   . hiatal hernia repair  as child  . LEEP  1990's  . MENISCUS REPAIR Right 6 yrs ago  . MYOMECTOMY ABDOMINAL APPROACH  02-15-2005  . ROBOT ASSISTED MYOMECTOMY  03/  2012    MEDICATIONS: . dupilumab (DUPIXENT) 200 MG/1.14ML prefilled syringe  . Albuterol Sulfate (PROAIR RESPICLICK) 223 (90 Base) MCG/ACT AEPB  . budesonide-formoterol (SYMBICORT) 160-4.5 MCG/ACT inhaler  . diltiazem 2 % GEL  . DULoxetine (CYMBALTA) 30 MG capsule  . esomeprazole (NEXIUM) 40 MG capsule  . etonogestrel (NEXPLANON) 68 MG IMPL implant  . ferrous sulfate 325 (65 FE) MG tablet  . fluticasone (FLONASE) 50 MCG/ACT nasal spray  . Ginger, Zingiber officinalis, (GINGER ROOT) 550 MG CAPS  . ibuprofen (ADVIL,MOTRIN) 600 MG tablet  . levocetirizine (XYZAL) 5 MG tablet  . mesalamine (LIALDA) 1.2 g EC tablet  . metoprolol succinate (TOPROL-XL) 100 MG 24 hr tablet  . montelukast (SINGULAIR) 10 MG tablet  . oxyCODONE-acetaminophen (ROXICET) 5-325 MG tablet  . triamcinolone cream  (KENALOG) 0.1 %  . Turmeric 500 MG TABS   No current facility-administered medications for this encounter.     If no changes, I anticipate pt can proceed with surgery as scheduled.   Willeen Cass, FNP-BC [facility] Short Stay Surgical Center/Anesthesiology Phone: [phone] 10/30/2018 1:04 PM

## 2018-10-30 NOTE — Anesthesia Preprocedure Evaluation (Addendum)
Anesthesia Evaluation  Patient identified by MRN, date of birth, ID band Patient awake    Reviewed: Allergy & Precautions, NPO status , Patient's Chart, lab work & pertinent test results  History of Anesthesia Complications Negative for: history of anesthetic complications  Airway Mallampati: II  TM Distance: >3 FB Neck ROM: Full    Dental no notable dental hx. (+) Dental Advisory Given   Pulmonary asthma ,    Pulmonary exam normal        Cardiovascular hypertension, Pt. on home beta blockers Normal cardiovascular exam     Neuro/Psych  Headaches, PSYCHIATRIC DISORDERS Anxiety Depression    GI/Hepatic Neg liver ROS, GERD  ,Crohn's Dz   Endo/Other  negative endocrine ROS  Renal/GU negative Renal ROS     Musculoskeletal  (+) Arthritis ,   Abdominal   Peds  Hematology negative hematology ROS (+)   Anesthesia Other Findings   Reproductive/Obstetrics                           Anesthesia Physical Anesthesia Plan  ASA: II  Anesthesia Plan: General   Post-op Pain Management:    Induction: Intravenous  PONV Risk Score and Plan: 4 or greater and Ondansetron, Dexamethasone, Scopolamine patch - Pre-op and Diphenhydramine  Airway Management Planned: Oral ETT  Additional Equipment:   Intra-op Plan:   Post-operative Plan: Extubation in OR  Informed Consent: I have reviewed the patients History and Physical, chart, labs and discussed the procedure including the risks, benefits and alternatives for the proposed anesthesia with the patient or authorized representative who has indicated his/her understanding and acceptance.     Dental advisory given  Plan Discussed with: CRNA, Anesthesiologist and Surgeon  Anesthesia Plan Comments: (See APP note by Willeen Cass, FNP)      Anesthesia Quick Evaluation

## 2018-11-02 ENCOUNTER — Other Ambulatory Visit (HOSPITAL_COMMUNITY)
Admission: RE | Admit: 2018-11-02 | Discharge: 2018-11-02 | Disposition: A | Discharge status: Home / Self Care | Payer: 59 | Source: Ambulatory Visit | Attending: Obstetrics and Gynecology | Admitting: Obstetrics and Gynecology

## 2018-11-02 ENCOUNTER — Other Ambulatory Visit (HOSPITAL_COMMUNITY): Payer: 59

## 2018-11-02 DIAGNOSIS — Z1159 Encounter for screening for other viral diseases: Secondary | ICD-10-CM | POA: Diagnosis not present

## 2018-11-02 LAB — SARS CORONAVIRUS 2 (TAT 6-24 HRS): SARS Coronavirus 2: NEGATIVE

## 2018-11-04 NOTE — H&P (Addendum)
43 y.o. G2P1011 complains of symptomatic fibroid uterus. "TA/TVUS: RF uterus with multiple uterine fibroids. Endometrial thickness 1 cm. At least 5 fibroids measured. Overall, interval increase in volume of each fibroid since last [place] in 2018. ROV normal appearing. LOV with 5 cm simple cyst. No FF in CDS." 14x8x6 in January. L ov cyst 5 cm, simple.  Bleeding last few months very heavy. Blood work to be done soon."  Pt continues to have pain in LLQ. She also has had irregular and heavy bleeding on Nexplanon.  Pt desires definitive.  Pts H/H stable but minor anemia at 10/34.  Pt s/p open myomectomy, C/S and LEEP cone x2.  Pt has HTN but stable on meds.    Past Medical History:  Diagnosis Date  . Anemia    allergy induced  . Anxiety   . Arthritis   . Asthma with allergic rhinitis   . Chronic back pain   . Crohn's disease in remission ([place])   . Depression   . Eczema   . GERD (gastroesophageal reflux disease)   . Headache   . Heart murmur    asymptomatic   . History of abnormal cervical Pap smear   . History of kidney stones   . Hypertension   . Irritable bowel   . Pinched nerve in neck   . Uterine fibroid    Past Surgical History:  Procedure Laterality Date  . BUNIONECTOMY Left   . CESAREAN SECTION  08/31/2011   Procedure: CESAREAN SECTION;  Surgeon: Farrel Gobble. Harrington Challenger, MD;  Location: [facility];  Service: Gynecology;  Laterality: N/A;  . COLONOSCOPY  last one 2015  . DILATATION & CURETTAGE/HYSTEROSCOPY WITH MYOSURE N/A 01/13/2015   Procedure: DILATATION & CURETTAGE/HYSTEROSCOPY WITH  MYOSURE;  Surgeon: Vanessa Kick, MD;  Location: [facility];  Service: Gynecology;  Laterality: N/A;  . ganglion cyst removed Right   . hiatal hernia repair  as child  . LEEP  1990's  . MENISCUS REPAIR Right 6 yrs ago  . MYOMECTOMY ABDOMINAL APPROACH  02-15-2005  . ROBOT ASSISTED MYOMECTOMY  03/ 2012    Social History   Socioeconomic History  . Marital status: Single    Spouse name: Not  on file  . Number of children: Not on file  . Years of education: Not on file  . Highest education level: Not on file  Occupational History  . Not on file  Social Needs  . Financial resource strain: Not on file  . Food insecurity    Worry: Not on file    Inability: Not on file  . Transportation needs    Medical: Not on file    Non-medical: Not on file  Tobacco Use  . Smoking status: Never Smoker  . Smokeless tobacco: Never Used  Substance and Sexual Activity  . Alcohol use: Yes    Comment: occasional  . Drug use: No  . Sexual activity: Yes    Birth control/protection: Pill, Implant    Comment: to be removed during surgery  Lifestyle  . Physical activity    Days per week: Not on file    Minutes per session: Not on file  . Stress: Not on file  Relationships  . Social Herbalist on phone: Not on file    Gets together: Not on file    Attends religious service: Not on file    Active member of club or organization: Not on file    Attends meetings of clubs or organizations: Not  on file    Relationship status: Not on file  . Intimate partner violence    Fear of current or ex partner: Not on file    Emotionally abused: Not on file    Physically abused: Not on file    Forced sexual activity: Not on file  Other Topics Concern  . Not on file  Social History Narrative  . Not on file    No current facility-administered medications on file prior to encounter.    Current Outpatient Medications on File Prior to Encounter  Medication Sig Dispense Refill  . Albuterol Sulfate (PROAIR RESPICLICK) 915 (90 Base) MCG/ACT AEPB Inhale 2 puffs into the lungs every 4 (four) hours as needed. 1 each 2  . budesonide-formoterol (SYMBICORT) 160-4.5 MCG/ACT inhaler Inhale 2 puffs into the lungs 2 (two) times daily as needed (shortness of breath).     . diltiazem 2 % GEL Apply 1 application topically 2 (two) times daily.    . DULoxetine (CYMBALTA) 30 MG capsule Take 30 mg by mouth at  bedtime.    Marland Kitchen esomeprazole (NEXIUM) 40 MG capsule Take 40 mg by mouth 2 (two) times daily before a meal.    . etonogestrel (NEXPLANON) 68 MG IMPL implant 68 mg by Subdermal route once.    . ferrous sulfate 325 (65 FE) MG tablet Take 325 mg by mouth at bedtime.     . fluticasone (FLONASE) 50 MCG/ACT nasal spray Place 2 sprays into both nostrils daily as needed for allergies or rhinitis. 16 g 5  . Ginger, Zingiber officinalis, (GINGER ROOT) 550 MG CAPS Take 550 mg by mouth daily.    Marland Kitchen levocetirizine (XYZAL) 5 MG tablet Take 1 tablet (5 mg total) by mouth every evening. 30 tablet 5  . mesalamine (LIALDA) 1.2 g EC tablet Take 2.4 g by mouth at bedtime.     . metoprolol succinate (TOPROL-XL) 100 MG 24 hr tablet Take 100 mg by mouth at bedtime. Take with or immediately following a meal.     . montelukast (SINGULAIR) 10 MG tablet Take 10 mg by mouth at bedtime.    . triamcinolone cream (KENALOG) 0.1 % Use sparingly to affected ares twice daily as needed (Patient taking differently: Apply 1 application topically 2 (two) times daily as needed (itching). ) 30 g 3  . Turmeric 500 MG TABS Take 500 mg by mouth daily.    Marland Kitchen ibuprofen (ADVIL,MOTRIN) 600 MG tablet Take 1 tablet (600 mg total) by mouth every 6 (six) hours as needed. (Patient not taking: Reported on 06/26/2018) 90 tablet 0  . oxyCODONE-acetaminophen (ROXICET) 5-325 MG tablet Take 1-2 tablets by mouth every 4 (four) hours as needed. May take 1-2 tablets every 4-6 hours as needed for pain (Patient not taking: Reported on 01/06/2016) 30 tablet 0    Allergies  Allergen Reactions  . Aciphex [Rabeprazole Sodium] Hives  . Lisinopril Swelling    Facial   . Iodinated Diagnostic Agents Itching and Hives    There were no vitals filed for this visit.  Lungs: clear to ascultation Cor:  RRR Abdomen:  soft, nontender, nondistended. Ex:  no cords, erythema Pelvic:   .   Vulva: no erythema, no lesions, no vesicles, no masses, no tenderness, no  atrophy .   Vagina: no erythema, no vesicles, no cystocele, no rectocele, no enterocele, no tenderness, no atrophy .   Cervix: grossly normal, no discharge, no cervical motion tenderness, sample taken for a Pap smear .   Uterus: normal contour, midline, no  uterine prolapse, mobile, non-tender, enlarged 14 weeks .   Bladder/Urethra: normal meatus, no urethral mass, no tenderness .   Adnexa/Parametria no mass palpable, no tenderness  A:  Symptomatic fibroid uterus.   For robotic  TLH/salpingectomies/cystoscopy/possible USO or BSO.   P: P: All risks, benefits and alternatives d/w patient and she desires to proceed.  Patient has undergone a modified diet, ERAS protocol and will receive preop antibiotics and SCDs during the operation.   Pt to have extended recovery but will go home same day if eating, ambulating, voiding and pain control is good.  Daria Pastures

## 2018-11-06 ENCOUNTER — Ambulatory Visit (HOSPITAL_BASED_OUTPATIENT_CLINIC_OR_DEPARTMENT_OTHER)
Admission: RE | Admit: 2018-11-06 | Discharge: 2018-11-07 | Disposition: A | Discharge status: Home / Self Care | Payer: 59 | Attending: Obstetrics and Gynecology | Admitting: Obstetrics and Gynecology

## 2018-11-06 ENCOUNTER — Encounter (HOSPITAL_BASED_OUTPATIENT_CLINIC_OR_DEPARTMENT_OTHER)
Admission: RE | Disposition: A | Discharge status: Home / Self Care | Payer: Self-pay | Source: Home / Self Care | Attending: Obstetrics and Gynecology

## 2018-11-06 ENCOUNTER — Encounter (HOSPITAL_BASED_OUTPATIENT_CLINIC_OR_DEPARTMENT_OTHER): Payer: Self-pay | Admitting: Emergency Medicine

## 2018-11-06 ENCOUNTER — Other Ambulatory Visit: Payer: Self-pay

## 2018-11-06 ENCOUNTER — Ambulatory Visit (HOSPITAL_BASED_OUTPATIENT_CLINIC_OR_DEPARTMENT_OTHER): Payer: 59 | Admitting: Emergency Medicine

## 2018-11-06 DIAGNOSIS — F419 Anxiety disorder, unspecified: Secondary | ICD-10-CM | POA: Diagnosis not present

## 2018-11-06 DIAGNOSIS — F329 Major depressive disorder, single episode, unspecified: Secondary | ICD-10-CM | POA: Insufficient documentation

## 2018-11-06 DIAGNOSIS — Z87442 Personal history of urinary calculi: Secondary | ICD-10-CM | POA: Insufficient documentation

## 2018-11-06 DIAGNOSIS — Z91041 Radiographic dye allergy status: Secondary | ICD-10-CM | POA: Insufficient documentation

## 2018-11-06 DIAGNOSIS — K219 Gastro-esophageal reflux disease without esophagitis: Secondary | ICD-10-CM | POA: Insufficient documentation

## 2018-11-06 DIAGNOSIS — Z888 Allergy status to other drugs, medicaments and biological substances status: Secondary | ICD-10-CM | POA: Insufficient documentation

## 2018-11-06 DIAGNOSIS — D259 Leiomyoma of uterus, unspecified: Secondary | ICD-10-CM | POA: Diagnosis present

## 2018-11-06 DIAGNOSIS — D649 Anemia, unspecified: Secondary | ICD-10-CM | POA: Insufficient documentation

## 2018-11-06 DIAGNOSIS — N809 Endometriosis, unspecified: Secondary | ICD-10-CM | POA: Insufficient documentation

## 2018-11-06 DIAGNOSIS — G8929 Other chronic pain: Secondary | ICD-10-CM | POA: Diagnosis not present

## 2018-11-06 DIAGNOSIS — Z79899 Other long term (current) drug therapy: Secondary | ICD-10-CM | POA: Insufficient documentation

## 2018-11-06 DIAGNOSIS — J45909 Unspecified asthma, uncomplicated: Secondary | ICD-10-CM | POA: Insufficient documentation

## 2018-11-06 DIAGNOSIS — D251 Intramural leiomyoma of uterus: Secondary | ICD-10-CM | POA: Insufficient documentation

## 2018-11-06 DIAGNOSIS — K5090 Crohn's disease, unspecified, without complications: Secondary | ICD-10-CM | POA: Diagnosis not present

## 2018-11-06 DIAGNOSIS — Z7951 Long term (current) use of inhaled steroids: Secondary | ICD-10-CM | POA: Diagnosis not present

## 2018-11-06 DIAGNOSIS — Z9889 Other specified postprocedural states: Secondary | ICD-10-CM

## 2018-11-06 DIAGNOSIS — I10 Essential (primary) hypertension: Secondary | ICD-10-CM | POA: Insufficient documentation

## 2018-11-06 DIAGNOSIS — M1990 Unspecified osteoarthritis, unspecified site: Secondary | ICD-10-CM | POA: Insufficient documentation

## 2018-11-06 DIAGNOSIS — E876 Hypokalemia: Secondary | ICD-10-CM | POA: Insufficient documentation

## 2018-11-06 HISTORY — PX: CYSTOSCOPY: SHX5120

## 2018-11-06 HISTORY — PX: ROBOTIC ASSISTED LAPAROSCOPIC HYSTERECTOMY AND SALPINGECTOMY: SHX6379

## 2018-11-06 HISTORY — PX: NORPLANT REMOVAL: SHX5385

## 2018-11-06 LAB — BASIC METABOLIC PANEL
Anion gap: 12 (ref 5–15)
BUN: 13 mg/dL (ref 6–20)
CO2: 26 mmol/L (ref 22–32)
Calcium: 9 mg/dL (ref 8.9–10.3)
Chloride: 98 mmol/L (ref 98–111)
Creatinine, Ser: 0.93 mg/dL (ref 0.44–1.00)
GFR calc Af Amer: >60 mL/min (ref >60)
GFR calc non Af Amer: >60 mL/min (ref >60)
Glucose, Bld: 124 mg/dL — ABNORMAL HIGH (ref 70–99)
Potassium: 2.7 mmol/L — CL (ref 3.5–5.1)
Sodium: 136 mmol/L (ref 135–145)

## 2018-11-06 LAB — POCT PREGNANCY, URINE: Preg Test, Ur: NEGATIVE

## 2018-11-06 SURGERY — XI ROBOTIC ASSISTED LAPAROSCOPIC HYSTERECTOMY AND SALPINGECTOMY
Anesthesia: General | Site: Arm Upper | Laterality: Left

## 2018-11-06 MED ORDER — CELECOXIB 400 MG PO CAPS
400.0000 mg | ORAL_CAPSULE | ORAL | Status: AC
  Administered 2018-11-06: 400 mg via ORAL
  Filled 2018-11-06: qty 1

## 2018-11-06 MED ORDER — MENTHOL 3 MG MT LOZG
LOZENGE | OROMUCOSAL | Status: AC
  Filled 2018-11-06: qty 9

## 2018-11-06 MED ORDER — SUGAMMADEX SODIUM 200 MG/2ML IV SOLN
INTRAVENOUS | Status: DC | PRN
  Administered 2018-11-06: 170 mg via INTRAVENOUS

## 2018-11-06 MED ORDER — ONDANSETRON HCL 4 MG/2ML IJ SOLN
INTRAMUSCULAR | Status: AC
  Filled 2018-11-06: qty 2

## 2018-11-06 MED ORDER — POTASSIUM CHLORIDE ER 10 MEQ PO TBCR
10.0000 meq | EXTENDED_RELEASE_TABLET | Freq: Every day | ORAL | Status: DC
  Administered 2018-11-06: 10 meq via ORAL
  Filled 2018-11-06: qty 1

## 2018-11-06 MED ORDER — MIDAZOLAM HCL 2 MG/2ML IJ SOLN
INTRAMUSCULAR | Status: AC
  Filled 2018-11-06: qty 2

## 2018-11-06 MED ORDER — DIPHENHYDRAMINE HCL 50 MG/ML IJ SOLN
INTRAMUSCULAR | Status: DC | PRN
  Administered 2018-11-06: 12.5 mg via INTRAVENOUS

## 2018-11-06 MED ORDER — GABAPENTIN 300 MG PO CAPS
ORAL_CAPSULE | ORAL | Status: AC
  Filled 2018-11-06: qty 1

## 2018-11-06 MED ORDER — DULOXETINE HCL 30 MG PO CPEP
30.0000 mg | ORAL_CAPSULE | Freq: Every day | ORAL | Status: DC
  Administered 2018-11-06: 30 mg via ORAL
  Filled 2018-11-06: qty 1

## 2018-11-06 MED ORDER — ONDANSETRON HCL 4 MG PO TABS
4.0000 mg | ORAL_TABLET | Freq: Four times a day (QID) | ORAL | Status: DC | PRN
  Filled 2018-11-06: qty 1

## 2018-11-06 MED ORDER — OXYCODONE-ACETAMINOPHEN 5-325 MG PO TABS
ORAL_TABLET | ORAL | Status: AC
  Filled 2018-11-06: qty 1

## 2018-11-06 MED ORDER — PROPOFOL 10 MG/ML IV BOLUS
INTRAVENOUS | Status: DC | PRN
  Administered 2018-11-06: 180 mg via INTRAVENOUS

## 2018-11-06 MED ORDER — ONDANSETRON HCL 4 MG/2ML IJ SOLN
4.0000 mg | Freq: Four times a day (QID) | INTRAMUSCULAR | Status: DC | PRN
  Administered 2018-11-06: 4 mg via INTRAVENOUS
  Filled 2018-11-06: qty 2

## 2018-11-06 MED ORDER — PHENYLEPHRINE 40 MCG/ML (10ML) SYRINGE FOR IV PUSH (FOR BLOOD PRESSURE SUPPORT)
PREFILLED_SYRINGE | INTRAVENOUS | Status: DC | PRN
  Administered 2018-11-06: 120 ug via INTRAVENOUS
  Administered 2018-11-06 (×2): 80 ug via INTRAVENOUS

## 2018-11-06 MED ORDER — KETOROLAC TROMETHAMINE 30 MG/ML IJ SOLN
INTRAMUSCULAR | Status: AC
  Filled 2018-11-06: qty 1

## 2018-11-06 MED ORDER — IBUPROFEN 800 MG PO TABS
800.0000 mg | ORAL_TABLET | Freq: Three times a day (TID) | ORAL | Status: DC
  Filled 2018-11-06: qty 1

## 2018-11-06 MED ORDER — SENNA 8.6 MG PO TABS
ORAL_TABLET | ORAL | Status: AC
  Filled 2018-11-06: qty 1

## 2018-11-06 MED ORDER — SENNA 8.6 MG PO TABS
1.0000 | ORAL_TABLET | Freq: Two times a day (BID) | ORAL | Status: DC
  Administered 2018-11-06 (×2): 8.6 mg via ORAL
  Filled 2018-11-06: qty 1

## 2018-11-06 MED ORDER — MOMETASONE FURO-FORMOTEROL FUM 200-5 MCG/ACT IN AERO
2.0000 | INHALATION_SPRAY | Freq: Two times a day (BID) | RESPIRATORY_TRACT | Status: DC
  Administered 2018-11-06: 2 via RESPIRATORY_TRACT
  Filled 2018-11-06: qty 8.8

## 2018-11-06 MED ORDER — CEFAZOLIN SODIUM-DEXTROSE 2-4 GM/100ML-% IV SOLN
INTRAVENOUS | Status: AC
  Filled 2018-11-06: qty 100

## 2018-11-06 MED ORDER — LIDOCAINE HCL (CARDIAC) PF 100 MG/5ML IV SOSY
PREFILLED_SYRINGE | INTRAVENOUS | Status: DC | PRN
  Administered 2018-11-06: 80 mg via INTRAVENOUS

## 2018-11-06 MED ORDER — LIDOCAINE 2% (20 MG/ML) 5 ML SYRINGE
INTRAMUSCULAR | Status: AC
  Filled 2018-11-06: qty 5

## 2018-11-06 MED ORDER — MESALAMINE 1.2 G PO TBEC
2.4000 g | DELAYED_RELEASE_TABLET | Freq: Every day | ORAL | Status: DC
  Administered 2018-11-06: 2.4 g via ORAL
  Filled 2018-11-06: qty 2

## 2018-11-06 MED ORDER — PROMETHAZINE HCL 25 MG/ML IJ SOLN
6.2500 mg | INTRAMUSCULAR | Status: DC | PRN
  Filled 2018-11-06: qty 1

## 2018-11-06 MED ORDER — ROCURONIUM BROMIDE 10 MG/ML (PF) SYRINGE
PREFILLED_SYRINGE | INTRAVENOUS | Status: AC
  Filled 2018-11-06: qty 10

## 2018-11-06 MED ORDER — OXYCODONE-ACETAMINOPHEN 5-325 MG PO TABS: 1.0000 | ORAL_TABLET | ORAL | 0 refills | Status: DC | PRN

## 2018-11-06 MED ORDER — FLUTICASONE PROPIONATE 50 MCG/ACT NA SUSP
2.0000 | Freq: Every day | NASAL | Status: DC | PRN
  Filled 2018-11-06: qty 16

## 2018-11-06 MED ORDER — GABAPENTIN 300 MG PO CAPS
300.0000 mg | ORAL_CAPSULE | ORAL | Status: AC
  Administered 2018-11-06: 300 mg via ORAL
  Filled 2018-11-06: qty 1

## 2018-11-06 MED ORDER — LEVOCETIRIZINE DIHYDROCHLORIDE 5 MG PO TABS: 5.0000 mg | ORAL_TABLET | Freq: Every evening | ORAL | Status: DC

## 2018-11-06 MED ORDER — STERILE WATER FOR IRRIGATION IR SOLN
Status: DC | PRN
  Administered 2018-11-06: 1000 mL via INTRAVESICAL

## 2018-11-06 MED ORDER — FENTANYL CITRATE (PF) 250 MCG/5ML IJ SOLN
INTRAMUSCULAR | Status: DC | PRN
  Administered 2018-11-06 (×2): 50 ug via INTRAVENOUS
  Administered 2018-11-06: 100 ug via INTRAVENOUS
  Administered 2018-11-06: 50 ug via INTRAVENOUS

## 2018-11-06 MED ORDER — SCOPOLAMINE 1 MG/3DAYS TD PT72
1.0000 | MEDICATED_PATCH | TRANSDERMAL | Status: DC
  Administered 2018-11-06: 1.5 mg via TRANSDERMAL
  Filled 2018-11-06: qty 1

## 2018-11-06 MED ORDER — PROPOFOL 10 MG/ML IV BOLUS
INTRAVENOUS | Status: AC
  Filled 2018-11-06: qty 20

## 2018-11-06 MED ORDER — MIDAZOLAM HCL 2 MG/2ML IJ SOLN
INTRAMUSCULAR | Status: DC | PRN
  Administered 2018-11-06: 2 mg via INTRAVENOUS

## 2018-11-06 MED ORDER — CELECOXIB 200 MG PO CAPS
ORAL_CAPSULE | ORAL | Status: AC
  Filled 2018-11-06: qty 2

## 2018-11-06 MED ORDER — MONTELUKAST SODIUM 10 MG PO TABS
10.0000 mg | ORAL_TABLET | Freq: Every day | ORAL | Status: DC
  Administered 2018-11-06: 10 mg via ORAL
  Filled 2018-11-06: qty 1

## 2018-11-06 MED ORDER — SCOPOLAMINE 1 MG/3DAYS TD PT72
MEDICATED_PATCH | TRANSDERMAL | Status: AC
  Filled 2018-11-06: qty 1

## 2018-11-06 MED ORDER — DEXAMETHASONE SODIUM PHOSPHATE 10 MG/ML IJ SOLN
INTRAMUSCULAR | Status: DC | PRN
  Administered 2018-11-06: 8 mg via INTRAVENOUS

## 2018-11-06 MED ORDER — ACETAMINOPHEN 500 MG PO TABS
1000.0000 mg | ORAL_TABLET | Freq: Once | ORAL | Status: AC
  Administered 2018-11-06: 1000 mg via ORAL
  Filled 2018-11-06: qty 2

## 2018-11-06 MED ORDER — PHENYLEPHRINE 40 MCG/ML (10ML) SYRINGE FOR IV PUSH (FOR BLOOD PRESSURE SUPPORT)
PREFILLED_SYRINGE | INTRAVENOUS | Status: AC
  Filled 2018-11-06: qty 10

## 2018-11-06 MED ORDER — ROCURONIUM BROMIDE 100 MG/10ML IV SOLN
INTRAVENOUS | Status: DC | PRN
  Administered 2018-11-06: 10 mg via INTRAVENOUS
  Administered 2018-11-06: 70 mg via INTRAVENOUS
  Administered 2018-11-06: 10 mg via INTRAVENOUS

## 2018-11-06 MED ORDER — KETOROLAC TROMETHAMINE 15 MG/ML IJ SOLN
15.0000 mg | INTRAMUSCULAR | Status: DC
  Filled 2018-11-06: qty 1

## 2018-11-06 MED ORDER — KETOROLAC TROMETHAMINE 30 MG/ML IJ SOLN
30.0000 mg | Freq: Four times a day (QID) | INTRAMUSCULAR | Status: AC
  Administered 2018-11-06 – 2018-11-07 (×4): 30 mg via INTRAVENOUS
  Filled 2018-11-06: qty 1

## 2018-11-06 MED ORDER — FENTANYL CITRATE (PF) 100 MCG/2ML IJ SOLN
INTRAMUSCULAR | Status: AC
  Filled 2018-11-06: qty 2

## 2018-11-06 MED ORDER — LACTATED RINGERS IV SOLN
INTRAVENOUS | Status: DC
  Administered 2018-11-06 (×2): via INTRAVENOUS
  Filled 2018-11-06: qty 1000

## 2018-11-06 MED ORDER — ONDANSETRON HCL 4 MG/2ML IJ SOLN
INTRAMUSCULAR | Status: DC | PRN
  Administered 2018-11-06: 4 mg via INTRAVENOUS

## 2018-11-06 MED ORDER — DEXAMETHASONE SODIUM PHOSPHATE 10 MG/ML IJ SOLN
INTRAMUSCULAR | Status: AC
  Filled 2018-11-06: qty 1

## 2018-11-06 MED ORDER — SODIUM CHLORIDE 0.9 % IV SOLN
INTRAVENOUS | Status: DC | PRN
  Administered 2018-11-06: 80 mL

## 2018-11-06 MED ORDER — SOD CITRATE-CITRIC ACID 500-334 MG/5ML PO SOLN
ORAL | Status: AC
  Filled 2018-11-06: qty 15

## 2018-11-06 MED ORDER — ALBUTEROL SULFATE 108 (90 BASE) MCG/ACT IN AEPB: 2.0000 | INHALATION_SPRAY | RESPIRATORY_TRACT | Status: DC | PRN

## 2018-11-06 MED ORDER — METOPROLOL SUCCINATE ER 100 MG PO TB24
100.0000 mg | ORAL_TABLET | Freq: Every day | ORAL | Status: DC
  Administered 2018-11-06: 100 mg via ORAL
  Filled 2018-11-06: qty 1

## 2018-11-06 MED ORDER — DIPHENHYDRAMINE HCL 50 MG/ML IJ SOLN
INTRAMUSCULAR | Status: AC
  Filled 2018-11-06: qty 1

## 2018-11-06 MED ORDER — OXYCODONE-ACETAMINOPHEN 5-325 MG PO TABS
1.0000 | ORAL_TABLET | ORAL | Status: DC | PRN
  Administered 2018-11-06: 1 via ORAL
  Administered 2018-11-06: 2 via ORAL
  Administered 2018-11-06: 1 via ORAL
  Administered 2018-11-07: 2 via ORAL
  Filled 2018-11-06: qty 2

## 2018-11-06 MED ORDER — SODIUM CHLORIDE 0.9 % IR SOLN
Status: DC | PRN
  Administered 2018-11-06: 3000 mL

## 2018-11-06 MED ORDER — ACETAMINOPHEN 500 MG PO TABS
ORAL_TABLET | ORAL | Status: AC
  Filled 2018-11-06: qty 2

## 2018-11-06 MED ORDER — CEFAZOLIN SODIUM-DEXTROSE 2-4 GM/100ML-% IV SOLN
2.0000 g | INTRAVENOUS | Status: AC
  Administered 2018-11-06: 2 g via INTRAVENOUS
  Filled 2018-11-06: qty 100

## 2018-11-06 MED ORDER — FENTANYL CITRATE (PF) 250 MCG/5ML IJ SOLN
INTRAMUSCULAR | Status: AC
  Filled 2018-11-06: qty 5

## 2018-11-06 MED ORDER — SOD CITRATE-CITRIC ACID 500-334 MG/5ML PO SOLN
30.0000 mL | ORAL | Status: AC
  Administered 2018-11-06: 30 mL via ORAL
  Filled 2018-11-06: qty 30

## 2018-11-06 MED ORDER — ACETAMINOPHEN 500 MG PO TABS
1000.0000 mg | ORAL_TABLET | ORAL | Status: DC
  Filled 2018-11-06: qty 2

## 2018-11-06 MED ORDER — SODIUM CHLORIDE (PF) 0.9 % IJ SOLN
INTRAMUSCULAR | Status: AC
  Filled 2018-11-06: qty 10

## 2018-11-06 MED ORDER — FENTANYL CITRATE (PF) 100 MCG/2ML IJ SOLN
25.0000 ug | INTRAMUSCULAR | Status: DC | PRN
  Administered 2018-11-06: 25 ug via INTRAVENOUS
  Administered 2018-11-06: 50 ug via INTRAVENOUS
  Filled 2018-11-06: qty 1

## 2018-11-06 MED ORDER — MENTHOL 3 MG MT LOZG
1.0000 | LOZENGE | OROMUCOSAL | Status: DC | PRN
  Filled 2018-11-06: qty 9

## 2018-11-06 MED ORDER — OXYCODONE-ACETAMINOPHEN 5-325 MG PO TABS
ORAL_TABLET | ORAL | Status: AC
  Filled 2018-11-06: qty 2

## 2018-11-06 SURGICAL SUPPLY — 58 items
ADH SKN CLS APL DERMABOND .7 (GAUZE/BANDAGES/DRESSINGS) ×3
BARRIER ADHS 3X4 INTERCEED (GAUZE/BANDAGES/DRESSINGS) IMPLANT
BLADE SURG 10 STRL SS (BLADE) ×1 IMPLANT
BNDG COHESIVE 2X5 TAN STRL LF (GAUZE/BANDAGES/DRESSINGS) ×1 IMPLANT
BRR ADH 4X3 ABS CNTRL BYND (GAUZE/BANDAGES/DRESSINGS)
CANISTER SUCT 3000ML PPV (MISCELLANEOUS) ×3 IMPLANT
CATH FOLEY 2WAY SLVR  5CC 16FR (CATHETERS) ×1
CATH FOLEY 2WAY SLVR 5CC 16FR (CATHETERS) ×3 IMPLANT
COVER BACK TABLE 60X90IN (DRAPES) ×4 IMPLANT
COVER TIP SHEARS 8 DVNC (MISCELLANEOUS) ×3 IMPLANT
COVER TIP SHEARS 8MM DA VINCI (MISCELLANEOUS) ×1
DECANTER SPIKE VIAL GLASS SM (MISCELLANEOUS) ×3 IMPLANT
DEFOGGER SCOPE WARMER CLEARIFY (MISCELLANEOUS) ×4 IMPLANT
DERMABOND ADVANCED (GAUZE/BANDAGES/DRESSINGS) ×1
DERMABOND ADVANCED .7 DNX12 (GAUZE/BANDAGES/DRESSINGS) ×3 IMPLANT
DRAPE ARM DVNC X/XI (DISPOSABLE) ×12 IMPLANT
DRAPE COLUMN DVNC XI (DISPOSABLE) ×3 IMPLANT
DRAPE DA VINCI XI ARM (DISPOSABLE) ×4
DRAPE DA VINCI XI COLUMN (DISPOSABLE) ×1
DURAPREP 26ML APPLICATOR (WOUND CARE) ×3 IMPLANT
ELECT REM PT RETURN 9FT ADLT (ELECTROSURGICAL) ×4
ELECTRODE REM PT RTRN 9FT ADLT (ELECTROSURGICAL) ×3 IMPLANT
GLOVE BIO SURGEON STRL SZ7 (GLOVE) ×12 IMPLANT
GLOVE BIOGEL PI IND STRL 7.0 (GLOVE) ×6 IMPLANT
GLOVE BIOGEL PI INDICATOR 7.0 (GLOVE) ×2
IRRIG SUCT STRYKERFLOW 2 WTIP (MISCELLANEOUS) ×4
IRRIGATION SUCT STRKRFLW 2 WTP (MISCELLANEOUS) ×3 IMPLANT
LEGGING LITHOTOMY PAIR STRL (DRAPES) ×4 IMPLANT
MANIPULATOR ADVINCU DEL 2.5 PL (MISCELLANEOUS) IMPLANT
MANIPULATOR ADVINCU DEL 3.0 PL (MISCELLANEOUS) ×1 IMPLANT
MANIPULATOR ADVINCU DEL 3.5 PL (MISCELLANEOUS) IMPLANT
MANIPULATOR ADVINCU DEL 4.0 PL (MISCELLANEOUS) IMPLANT
NEEDLE INSUFFLATION 120MM (ENDOMECHANICALS) ×4 IMPLANT
OBTURATOR OPTICAL STANDARD 8MM (TROCAR) ×1
OBTURATOR OPTICAL STND 8 DVNC (TROCAR) ×3
OBTURATOR OPTICALSTD 8 DVNC (TROCAR) ×3 IMPLANT
PACK ROBOT WH (CUSTOM PROCEDURE TRAY) ×4 IMPLANT
PACK ROBOTIC GOWN (GOWN DISPOSABLE) ×4 IMPLANT
PACK TRENDGUARD 450 HYBRID PRO (MISCELLANEOUS) IMPLANT
PAD PREP 24X48 CUFFED NSTRL (MISCELLANEOUS) ×4 IMPLANT
POUCH LAPAROSCOPIC INSTRUMENT (MISCELLANEOUS) ×3 IMPLANT
PROTECTOR NERVE ULNAR (MISCELLANEOUS) ×7 IMPLANT
SEAL CANN UNIV 5-8 DVNC XI (MISCELLANEOUS) ×9 IMPLANT
SEAL XI 5MM-8MM UNIVERSAL (MISCELLANEOUS) ×4
SET CYSTO W/LG BORE CLAMP LF (SET/KITS/TRAYS/PACK) ×3 IMPLANT
SET TRI-LUMEN FLTR TB AIRSEAL (TUBING) ×3 IMPLANT
SPONGE GAUZE 2X2 8PLY STRL LF (GAUZE/BANDAGES/DRESSINGS) ×1 IMPLANT
STRIP CLOSURE SKIN 1/2X4 (GAUZE/BANDAGES/DRESSINGS) ×1 IMPLANT
SUT VIC AB 2-0 UR6 27 (SUTURE) ×4 IMPLANT
SUT VICRYL RAPIDE 3 0 (SUTURE) ×8 IMPLANT
SUT VLOC 180 0 9IN  GS21 (SUTURE) ×1
SUT VLOC 180 0 9IN GS21 (SUTURE) ×3 IMPLANT
TOWEL OR 17X26 10 PK STRL BLUE (TOWEL DISPOSABLE) ×8 IMPLANT
TRENDGUARD 450 HYBRID PRO PACK (MISCELLANEOUS) ×4
TROCAR OPTICAL STANDARD 5MM (TROCAR) ×1 IMPLANT
TROCAR PORT AIRSEAL 5X120 (TROCAR) ×3 IMPLANT
TUBING EVAC SMOKE HEATED PNEUM (TUBING) ×1 IMPLANT
WATER STERILE IRR 1000ML POUR (IV SOLUTION) ×3 IMPLANT

## 2018-11-06 NOTE — Discharge Summary (Signed)
Physician Discharge Summary  Patient ID: Colleen Baker MRN: [id number] DOB/AGE: 1975-06-07 43 y.o.  Admit date: 11/06/2018 Discharge date: 11/06/2018  Admission Diagnoses:symptomatic fibroid uterus  Discharge Diagnoses: same plus endometriosis Active Problems:   Postoperative state   Discharged Condition: good  Hospital Course: Uncomplicated removal of nexplanon, robotic TLH/salpingectomies/cystoscopy and removal of small area of endometriosis.  Hypokalemia did not affect surgery.  Consults: None  Significant Diagnostic Studies: labs:  Results for orders placed or performed during the hospital encounter of 11/06/18 (from the past 24 hour(s))  Pregnancy, urine POC     Status: None   Collection Time: 11/06/18  5:49 AM  Result Value Ref Range   Preg Test, Ur NEGATIVE NEGATIVE  Basic metabolic panel     Status: Abnormal   Collection Time: 11/06/18  6:08 AM  Result Value Ref Range   Sodium 136 135 - 145 mmol/L   Potassium 2.7 (LL) 3.5 - 5.1 mmol/L   Chloride 98 98 - 111 mmol/L   CO2 26 22 - 32 mmol/L   Glucose, Bld 124 (H) 70 - 99 mg/dL   BUN 13 6 - 20 mg/dL   Creatinine, Ser 0.93 0.44 - 1.00 mg/dL   Calcium 9.0 8.9 - 10.3 mg/dL   GFR calc non Af Amer >60 >60 mL/min   GFR calc Af Amer >60 >60 mL/min   Anion gap 12 5 - 15    Treatments: surgery: same as hospital course.  Discharge Exam: Blood pressure 125/77, pulse 81, temperature 98.2 F (36.8 C), resp. rate 16, height 5\' 10"  (1.778 m), weight 85 kg, last menstrual period 10/31/2018, SpO2 99 %.   Disposition: Discharge disposition: 01-Home or Self Care       Discharge Instructions    Call MD for:  temperature >100.4   Complete by: As directed    Diet - low sodium heart healthy   Complete by: As directed    Discharge instructions   Complete by: As directed    No driving on narcotics, no sexual activity for 2 weeks.   Increase activity slowly   Complete by: As directed    May shower / Bathe   Complete by: As  directed    Shower, no bath for 2 weeks.   Remove dressing in 24 hours   Complete by: As directed    Sexual Activity Restrictions   Complete by: As directed    No sexual activity for 2 weeks.     Allergies as of 11/06/2018      Reactions   Aciphex [rabeprazole Sodium] Hives   Lisinopril Swelling   Facial    Iodinated Diagnostic Agents Itching, Hives      Medication List    STOP taking these medications   etonogestrel 68 MG Impl implant Commonly known as: NEXPLANON     TAKE these medications   Albuterol Sulfate 108 (90 Base) MCG/ACT Aepb Commonly known as: ProAir RespiClick Inhale 2 puffs into the lungs every 4 (four) hours as needed.   diltiazem 2 % Gel Apply 1 application topically 2 (two) times daily.   DULoxetine 30 MG capsule Commonly known as: CYMBALTA Take 30 mg by mouth at bedtime.   dupilumab 200 MG/1.14ML prefilled syringe Commonly known as: DUPIXENT Inject 300 mg into the skin every 14 (fourteen) days. For excema   esomeprazole 40 MG capsule Commonly known as: NEXIUM Take 40 mg by mouth 2 (two) times daily before a meal.   ferrous sulfate 325 (65 FE) MG tablet Take 325 mg by  mouth at bedtime.   fluticasone 50 MCG/ACT nasal spray Commonly known as: Flonase Place 2 sprays into both nostrils daily as needed for allergies or rhinitis.   Ginger Root 550 MG Caps Take 550 mg by mouth daily.   ibuprofen 600 MG tablet Commonly known as: ADVIL Take 1 tablet (600 mg total) by mouth every 6 (six) hours as needed.   levocetirizine 5 MG tablet Commonly known as: XYZAL Take 1 tablet (5 mg total) by mouth every evening.   mesalamine 1.2 g EC tablet Commonly known as: LIALDA Take 2.4 g by mouth at bedtime.   metoprolol succinate 100 MG 24 hr tablet Commonly known as: TOPROL-XL Take 100 mg by mouth at bedtime. Take with or immediately following a meal.   montelukast 10 MG tablet Commonly known as: SINGULAIR Take 10 mg by mouth at bedtime.    oxyCODONE-acetaminophen 5-325 MG tablet Commonly known as: PERCOCET/ROXICET Take 1 tablet by mouth every 4 (four) hours as needed for severe pain. What changed:   how much to take  reasons to take this  additional instructions   potassium chloride 10 MEQ tablet Commonly known as: K-DUR Take 10 mEq by mouth daily.   Symbicort 160-4.5 MCG/ACT inhaler Generic drug: budesonide-formoterol Inhale 2 puffs into the lungs 2 (two) times daily as needed (shortness of breath).   triamcinolone cream 0.1 % Commonly known as: KENALOG Use sparingly to affected ares twice daily as needed What changed:   how much to take  how to take this  when to take this  reasons to take this  additional instructions   Turmeric 500 MG Tabs Take 500 mg by mouth daily.      Follow-up Information    Bobbye Charleston, MD Follow up in 2 week(s).   Specialty: Obstetrics and Gynecology Contact information: [address] [place] [address] [phone]           Signed: Daria Pastures 11/06/2018, 11:28 AM

## 2018-11-06 NOTE — Progress Notes (Signed)
11/06/2018  1900 Pt. Unable to void, pt. States feeling full and having the urge to urinate but unable to start stream. Bladder scan performed showing 237 ml urine in bladder. Dr. Philis Pique contacted and verbal order received to I&O cath x 1. Orders enacted and 250 ml amber urine returned from bladder. Pt. States relief of symptoms.  8:55 PM Dr. Philis Pique called and made aware of pt. Still unable to spontaneously void since I&O cath. Verbal order received to allow patient to stay overnight, saline lock IV and encourage PO intake. Verbal order received ok to repeat I&O cath in four hours if unable to void. Verbal orders placed and enacted. Ashwath Lasch, Arville Lime

## 2018-11-06 NOTE — Progress Notes (Signed)
There has been no change in the patients history, status or exam since the history and physical.  Vitals:   11/06/18 0538  BP: (!) 143/89  Pulse: 81  Resp: 16  Temp: 98.2 F (36.8 C)  TempSrc: Oral  SpO2: 100%  Weight: 85 kg  Height: 5\' 10"  (1.778 m)    Results for orders placed or performed during the hospital encounter of 11/06/18 (from the past 72 hour(s))  Pregnancy, urine POC     Status: None   Collection Time: 11/06/18  5:49 AM  Result Value Ref Range   Preg Test, Ur NEGATIVE NEGATIVE    Comment:        THE SENSITIVITY OF THIS METHODOLOGY IS >24 mIU/mL   Basic metabolic panel     Status: Abnormal   Collection Time: 11/06/18  6:08 AM  Result Value Ref Range   Sodium 136 135 - 145 mmol/L   Potassium 2.7 (LL) 3.5 - 5.1 mmol/L    Comment: CRITICAL RESULT CALLED TO, READ BACK BY AND VERIFIED WITH: BROOKS,D @ 0645 ON [id number] BY POTEAT,S    Chloride 98 98 - 111 mmol/L   CO2 26 22 - 32 mmol/L   Glucose, Bld 124 (H) 70 - 99 mg/dL   BUN 13 6 - 20 mg/dL   Creatinine, Ser 0.93 0.44 - 1.00 mg/dL   Calcium 9.0 8.9 - 10.3 mg/dL   GFR calc non Af Amer >60 >60 mL/min   GFR calc Af Amer >60 >60 mL/min   Anion gap 12 5 - 15    Comment: Performed at [facility], [place] [address], [place], [place] [address]   Pt consented for Nexplanon removal as well.  Daria Pastures

## 2018-11-06 NOTE — Brief Op Note (Signed)
11/06/2018  10:50 AM  PATIENT:  Colleen Baker  43 y.o. female  PRE-OPERATIVE DIAGNOSIS:  FIBROIDS  POST-OPERATIVE DIAGNOSIS:  FIBROIDS  PROCEDURE:  Procedure(s): XI ROBOTIC ASSISTED LAPAROSCOPIC HYSTERECTOMY WITH BILATERAL SALPINGECTOMY (Bilateral) REMOVAL OF NEXPLANON (Left) CYSTOSCOPY  SURGEON:  Surgeon(s) and Role:    * Bobbye Charleston, MD - Primary    * Jerelyn Charles, MD - Assisting  ANESTHESIA:   general  EBL:  100 mL   LOCAL MEDICATIONS USED:  OTHER ropivicaine.  SPECIMEN:  Source of Specimen:  uterus, cervix, bilateral tubes  DISPOSITION OF SPECIMEN:  PATHOLOGY  COUNTS:  YES  TOURNIQUET:  * No tourniquets in log *  DICTATION: .Note written in EPIC  PLAN OF CARE: Admit for overnight observation  PATIENT DISPOSITION:  PACU - hemodynamically stable.   Delay start of Pharmacological VTE agent (>24hrs) due to surgical blood loss or risk of bleeding: not applicable

## 2018-11-06 NOTE — Op Note (Addendum)
11/06/2018  10:50 AM  PATIENT:  Colleen Baker  43 y.o. female  PRE-OPERATIVE DIAGNOSIS:  FIBROIDS  POST-OPERATIVE DIAGNOSIS:  FIBROIDS  PROCEDURE:  Procedure(s): XI ROBOTIC ASSISTED LAPAROSCOPIC HYSTERECTOMY WITH BILATERAL SALPINGECTOMY (Bilateral) REMOVAL OF NEXPLANON (Left) CYSTOSCOPY  SURGEON:  Surgeon(s) and Role:    * Bobbye Charleston, MD - Primary    * Jerelyn Charles, MD - Assisting  ANESTHESIA:   general  EBL:  100 mL   LOCAL MEDICATIONS USED:  OTHER ropivicaine.  SPECIMEN:  Source of Specimen:  uterus, cervix, bilateral tubes.  Excision of epiploica with presumed endometriosis.   DISPOSITION OF SPECIMEN:  PATHOLOGY  COUNTS:  YES  TOURNIQUET:  * No tourniquets in log *  DICTATION: .Note written in [place]: Admit for overnight observation  PATIENT DISPOSITION:  PACU - hemodynamically stable.   Delay start of Pharmacological VTE agent (>24hrs) due to surgical blood loss or risk of bleeding: not applicable  Complications:  None.  Findings:  15 weeks size uterus with multiple fibroids.  Ovaries were normal- LO had small cyst on it that appeared benign.  The ureters were identified during multiple points of the case and were always out of the field of dissection.  On cystoscopy, the bladder was intact and bilateral spill was seen from each ureteral orriface.  A 1 cm patch of presumed endometriosis was seen on epiploica of the descending colon and removed.   Medications:  Ancef.  Ropivicaine.      Technique:   After adequate anesthesia was achieved the patient was positioned, prepped and draped in usual sterile fashion.  The L arm was cleaned at the marked area for the Nexplanon and a 5 mm incision made with the scalpel.  The Nexplanon was removed with the hemostat intact and then the incision closed with a steri strip and a coban dressing.    Attention then was turned to the perineum and a speculum was placed in the vagina and the cervix dilated with pratt  dilators.  The 3 cm Koh ring Advincula was assembled and placed in proper fashion.  The  Speculum was removed and the bladder catheterized with a foley.     Attention was turned to the abdomen where a 1 cm incision was made 1 cm above the umbilicus.  The veress needle was introduced without aspiration of bowel contents or blood and the abdomen insufflated. The 8.5 mm Robotic trocar was placed and the other three trocar sites were marked out, all approximately 10 cm from each other and the camera.  Two 8.5 mm trocars were placed on either side of the camera port and a 5 mm assistant port was placed 3 cm above the line of the other trocars.  All trocars were inserted under direct visualization of the camera.  The patient was placed in trendelenburg and then the Robot docked.  The fenestrated bipolar were placed on arm 1 and the Hot shears on arm 3 and introduced under direct visualization of the camera.   I then broke scrub and sat down at the console.  The above findings were noted and the ureters identified well out of the field of dissection.  The right fallopian tube was isolated and cauterized with the bipolar.  The Utero-ovarian ligament was then divided with the bipolar cautery and shears.  The posterior broad ligament was then divided with the hot shears until the uterosacral ligament.  The Broad and cardinal ligaments were then cauterized against the cervix to the level  of the [facility] ring, securing the uterine artery.  Each pedicle was then incised with the shears.  The anterior leaf was then incised at the reflection of the vessico-uterine junction and the lateral bladder retracted inferiorly after the round ligament had been divided with the bipolar forceps.  The left tube was cauterized with the bipolar and divided with the shears;  then the left utero-ovarian ligament divided with the bipolar forceps and the scissors.  The round ligament was divided as well and the posterior leaf of the broad ligament  then divided with the hot shears. The broad and cardinal ligaments were then cauterized on the left in the same way.   At the level of the internal os, the uterine arteries were bilaterally cauterized with the bipolar.  The ureters were identified well out of the field of dissection.     The bladder was then able to be retracted inferiorly and the vesico-uterine fascia was incised in the midline until the bladder was removed one cm below the Koh ring.  The hot shears then circumferentially incised the vagina at the level of the reflection on the [facility] ring.  Once the uterus and cervix were amputated, cautery was used to insure hemostasis of the cuff.  The uterus was then removed in pieces, first by cautery by robot above and then by scalpel below.  Each piece was then removed through the vaginal cuff.  The vagina and perineum had a small amount of excoriation but no lacerations from the manipulation of the uterus.   Once the uterus was removed with the tube and hemostasis was achieved, the scissors were changed to the mega suture cut needle driver and the cuff was closed with a running stitches of 0-vicryl V loc.  Cautery was used to ensure hemostasis of the left pedicles very superficially. The ureters were peristalsing bilaterally well and very lateral to the areas of operation.     The Robot was then undocked and I scrubbed back in.  The needle was removed and Ropivicaine was introduced into the pelvis. The skin incisions were closed with subcuticular stitches of 3-0 vicryl Rapide and Dermabond.  All instruments were removed from the vagina and cystoscopy performed, revealing an intact bladder and vigourous spill of urine from each ureteral orifice.  The cystoscope was removed and the patient taken to the recovery room in stable condition.  Addendum:  While operating on the left side of the uterus, a small 1 cm patch of gunpowder lesions were seen on the .  The scissors were used sharply and with cautery to  remove this area and it was sent to path.    Porsche Noguchi A

## 2018-11-06 NOTE — Anesthesia Postprocedure Evaluation (Signed)
Anesthesia Post Note  Patient: Colleen Baker  Procedure(s) Performed: XI ROBOTIC ASSISTED LAPAROSCOPIC HYSTERECTOMY WITH BILATERAL SALPINGECTOMY (Bilateral ) REMOVAL OF NEXPLANON (Left Arm Upper) CYSTOSCOPY     Patient location during evaluation: PACU Anesthesia Type: General Level of consciousness: sedated Pain management: pain level controlled Vital Signs Assessment: post-procedure vital signs reviewed and stable Respiratory status: spontaneous breathing and respiratory function stable Cardiovascular status: stable Postop Assessment: no apparent nausea or vomiting Anesthetic complications: no    Last Vitals:  Vitals:   11/06/18 1200 11/06/18 1230  BP: 120/82 131/88  Pulse: 64 65  Resp: 13 11  Temp:    SpO2:  100%    Last Pain:  Vitals:   11/06/18 1215  TempSrc:   PainSc: 6                  Dornell Grasmick DANIEL

## 2018-11-06 NOTE — Anesthesia Procedure Notes (Signed)
Procedure Name: Intubation Date/Time: 11/06/2018 7:34 AM Performed by: Raenette Rover, CRNA Pre-anesthesia Checklist: Patient identified, Emergency Drugs available, Suction available and Patient being monitored Patient Re-evaluated:Patient Re-evaluated prior to induction Oxygen Delivery Method: Circle system utilized Preoxygenation: Pre-oxygenation with 100% oxygen Induction Type: IV induction Ventilation: Mask ventilation without difficulty Laryngoscope Size: Mac and 3 Grade View: Grade I Tube type: Oral Tube size: 7.0 mm Number of attempts: 1 Airway Equipment and Method: Stylet Placement Confirmation: ETT inserted through vocal cords under direct vision,  positive ETCO2,  CO2 detector and breath sounds checked- equal and bilateral Secured at: 22 cm Tube secured with: Tape Dental Injury: Teeth and Oropharynx as per pre-operative assessment

## 2018-11-06 NOTE — Transfer of Care (Signed)
Immediate Anesthesia Transfer of Care Note  Patient: Colleen Baker  Procedure(s) Performed: XI ROBOTIC ASSISTED LAPAROSCOPIC HYSTERECTOMY WITH BILATERAL SALPINGECTOMY (Bilateral ) REMOVAL OF NEXPLANON (Left Arm Upper) CYSTOSCOPY  Patient Location: PACU  Anesthesia Type:General  Level of Consciousness: drowsy and patient cooperative  Airway & Oxygen Therapy: Patient Spontanous Breathing and Patient connected to face mask oxygen  Post-op Assessment: Report given to RN and Post -op Vital signs reviewed and stable  Post vital signs: Reviewed and stable  Last Vitals:  Vitals Value Taken Time  BP 125/77 11/06/18 1105  Temp    Pulse 66 11/06/18 1109  Resp 16 11/06/18 1109  SpO2 99 % 11/06/18 1109  Vitals shown include unvalidated device data.  Last Pain:  Vitals:   11/06/18 0538  TempSrc: Oral  PainSc: 3       Patients Stated Pain Goal: 3 (57/49/35 5217)  Complications: No apparent anesthesia complications

## 2018-11-07 ENCOUNTER — Encounter (HOSPITAL_BASED_OUTPATIENT_CLINIC_OR_DEPARTMENT_OTHER): Payer: Self-pay | Admitting: Obstetrics and Gynecology

## 2018-11-07 DIAGNOSIS — D251 Intramural leiomyoma of uterus: Secondary | ICD-10-CM | POA: Diagnosis not present

## 2018-11-07 LAB — BASIC METABOLIC PANEL
Anion gap: 10 (ref 5–15)
BUN: 13 mg/dL (ref 6–20)
CO2: 27 mmol/L (ref 22–32)
Calcium: 7.6 mg/dL — ABNORMAL LOW (ref 8.9–10.3)
Chloride: 92 mmol/L — ABNORMAL LOW (ref 98–111)
Creatinine, Ser: 1.11 mg/dL — ABNORMAL HIGH (ref 0.44–1.00)
GFR calc Af Amer: >60 mL/min (ref >60)
GFR calc non Af Amer: >60 mL/min (ref >60)
Glucose, Bld: 121 mg/dL — ABNORMAL HIGH (ref 70–99)
Potassium: 2.6 mmol/L — CL (ref 3.5–5.1)
Sodium: 129 mmol/L — ABNORMAL LOW (ref 135–145)

## 2018-11-07 MED ORDER — IBUPROFEN 800 MG PO TABS: 800.0000 mg | ORAL_TABLET | Freq: Three times a day (TID) | ORAL | 0 refills | Status: DC

## 2018-11-07 MED ORDER — KETOROLAC TROMETHAMINE 30 MG/ML IJ SOLN
INTRAMUSCULAR | Status: AC
  Filled 2018-11-07: qty 1

## 2018-11-07 MED ORDER — OXYCODONE-ACETAMINOPHEN 5-325 MG PO TABS
ORAL_TABLET | ORAL | Status: AC
  Filled 2018-11-07: qty 2

## 2018-11-07 NOTE — Progress Notes (Signed)
Patient is eating, ambulating, and now voiding.  Pain control is good.  Vitals:   11/06/18 1743 11/06/18 2122 11/07/18 0144 11/07/18 0615  BP: 114/62 116/77 126/79 118/63  Pulse: 73 74 65 64  Resp: 16 16 16 16   Temp: 97.6 F (36.4 C) 98 F (36.7 C) 97.9 F (36.6 C) 98 F (36.7 C)  TempSrc:      SpO2: 100% 100% 100% 100%  Weight:      Height:        lungs:   clear to auscultation cor:    RRR Abdomen:  soft, appropriate tenderness, incisions intact and without erythema or exudate. ex:    no cords   Lab Results  Component Value Date   WBC 8.4 10/29/2018   HGB 10.6 (L) 10/29/2018   HCT 34.6 (L) 10/29/2018   MCV 83.6 10/29/2018   PLT 294 10/29/2018    A/P  Routine care.  Expect d/c per plan.  Pt stayed overnight as she needed I&O cath once and was able to void after 9 pm.  Since she has voided two more times on her own.  Will check BMP this am for K+ and d/c.

## 2018-11-07 NOTE — Progress Notes (Signed)
Dr. Philis Pique cell number called and left her message K level 2.6, yesterday 2.7 and nine days ago 2.6.  Waiting  for dr. Philis Pique to call .

## 2018-11-07 NOTE — Progress Notes (Signed)
Pt called me into room c/o when she drank water and felt like it was stuck in her throat.  Given throat lozenge and will continue to monitor

## 2018-11-07 NOTE — Progress Notes (Signed)
Dr. Philis Pique office called.  Dr. Philis Pique notified K 2.6 today abd yesterday 2.7 and nine days ago 2.6, dr. Philis Pique said pt can go home.

## 2019-02-26 ENCOUNTER — Telehealth: Payer: Self-pay | Admitting: *Deleted

## 2019-02-26 NOTE — Telephone Encounter (Signed)
Pt called asking for an ENT referral. Pt last seen by Dr. Verlin Fester 01/06/16 and she requires an apt for a new patient first-since it has been over 3 years.

## 2019-03-05 ENCOUNTER — Ambulatory Visit: Payer: 59 | Admitting: Allergy and Immunology

## 2019-03-19 ENCOUNTER — Other Ambulatory Visit: Payer: Self-pay

## 2019-03-19 ENCOUNTER — Other Ambulatory Visit: Payer: Self-pay | Admitting: *Deleted

## 2019-03-19 ENCOUNTER — Ambulatory Visit: Payer: 59 | Admitting: Allergy and Immunology

## 2019-03-19 ENCOUNTER — Encounter: Payer: Self-pay | Admitting: Allergy and Immunology

## 2019-03-19 VITALS — BP 110/70 | HR 94 | Temp 98.6°F | Resp 12 | Ht 70.0 in | Wt 191.1 lb

## 2019-03-19 DIAGNOSIS — J4520 Mild intermittent asthma, uncomplicated: Secondary | ICD-10-CM | POA: Diagnosis not present

## 2019-03-19 DIAGNOSIS — J3089 Other allergic rhinitis: Secondary | ICD-10-CM | POA: Diagnosis not present

## 2019-03-19 MED ORDER — PROAIR RESPICLICK 108 (90 BASE) MCG/ACT IN AEPB: 1.0000 | INHALATION_SPRAY | RESPIRATORY_TRACT | 2 refills | Status: DC | PRN

## 2019-03-19 MED ORDER — AZELASTINE HCL 0.15 % NA SOLN: 1.0000 | Freq: Two times a day (BID) | NASAL | 5 refills | Status: DC | PRN

## 2019-03-19 MED ORDER — ALBUTEROL SULFATE HFA 108 (90 BASE) MCG/ACT IN AERS: 2.0000 | INHALATION_SPRAY | RESPIRATORY_TRACT | 1 refills | Status: DC | PRN

## 2019-03-19 NOTE — Telephone Encounter (Signed)
Received pa for proair respiclick- sent in proair

## 2019-03-19 NOTE — Assessment & Plan Note (Signed)
   Aeroallergen avoidance measures have been discussed and provided in written form.  A prescription has been provided for azelastine/fluticasone nasal spray, 1 spray per nostril 2 times daily as needed. Proper nasal spray technique has been discussed and demonstrated.   Nasal saline spray (i.e., Simply Saline) or nasal saline lavage (i.e., NeilMed) is recommended as needed and prior to medicated nasal sprays.

## 2019-03-19 NOTE — Progress Notes (Signed)
New Patient Note  RE: Colleen Baker MRN: [id number] DOB: [birth year] Date of Office Visit: 03/19/2019  Referring provider: Daylene Posey, [place] Primary care provider: Daylene Posey, FNP  Chief Complaint: Cough, Breathing Problem, and Nasal Congestion   History of present illness: Colleen Baker is a 43 y.o. female seen today in consultation requested by Daylene Posey, FNP.  She has a history of mild intermittent asthma and reports that this summer she experienced recurrent episodes of shortness of breath and coughing.  These lower respiratory symptoms tended to occur while she was at her son's football practices during the summer.  She has also been struggling with anxiety and depression and feels that her lower respiratory symptoms increase while she is experiencing anxiety.  She also complains of nasal congestion "almost every single day", particularly in the morning time.  She is currently attempting to address this issue by blowing her nose in the shower.  No significant seasonal symptom variation has been noted nor have specific environmental triggers been identified.  Over the past few months she has been experiencing "extremely dry" eyes.  She was given samples of Refresh eyedrops by her optometrist.  The Refresh eyedrops provide temporary relief.  Assessment and plan: Mild intermittent asthma  A prescription has been provided for ProAir Respiclick, 1-2 inhalations every 4-6 hours as needed.  Subjective and objective measures of pulmonary function will be followed and the treatment plan will be adjusted accordingly.  Perennial allergic rhinitis with a possible nonallergic component  Aeroallergen avoidance measures have been discussed and provided in written form.  A prescription has been provided for azelastine/fluticasone nasal spray, 1 spray per nostril 2 times daily as needed. Proper nasal spray technique has been discussed and demonstrated.   Nasal saline spray (i.e., Simply  Saline) or nasal saline lavage (i.e., NeilMed) is recommended as needed and prior to medicated nasal sprays.   Meds ordered this encounter  Medications  . Albuterol Sulfate (PROAIR RESPICLICK) 123XX123 (90 Base) MCG/ACT AEPB    Sig: Inhale 1-2 puffs into the lungs every 4 (four) hours as needed. For cough or wheeze    Dispense:  1 each    Refill:  2  . Azelastine HCl 0.15 % SOLN    Sig: Place 1 spray into both nostrils 2 (two) times daily as needed.    Dispense:  30 mL    Refill:  5    Diagnostics: Spirometry: Normal with an FEV1 of 95% predicted with an FEV1 ratio of 101%. This study was performed while the patient was asymptomatic.  Please see scanned spirometry results for details. Epicutaneous testing: Negative. Intradermal testing: Positive to molds and dust mite antigen.  Physical examination: Blood pressure 110/70, pulse 94, temperature 98.6 F (37 C), temperature source Oral, resp. rate 12, height 5\' 10"  (1.778 m), weight 191 lb 2.2 oz (86.7 kg), SpO2 98 %.  General: Alert, interactive, in no acute distress. HEENT: TMs pearly gray, turbinates moderately edematous without discharge, post-pharynx mildly erythematous. Neck: Supple without lymphadenopathy. Lungs: Clear to auscultation without wheezing, rhonchi or rales. CV: Normal S1, S2 without murmurs. Abdomen: Nondistended, nontender. Skin: Warm and dry, without lesions or rashes. Extremities:  No clubbing, cyanosis or edema. Neuro:   Grossly intact.  Review of systems:  Review of systems negative except as noted in HPI / PMHx or noted below: Review of Systems  Constitutional: Negative.   HENT: Negative.   Eyes: Negative.   Respiratory: Negative.   Cardiovascular: Negative.   Gastrointestinal: Negative.  Genitourinary: Negative.   Musculoskeletal: Negative.   Skin: Negative.   Neurological: Negative.   Endo/Heme/Allergies: Negative.   Psychiatric/Behavioral: Negative.     Past medical history:  Past Medical  History:  Diagnosis Date  . Anemia    allergy induced  . Anxiety   . Arthritis   . Asthma with allergic rhinitis   . Chronic back pain   . Crohn's disease in remission ([place])   . Depression   . Eczema   . GERD (gastroesophageal reflux disease)   . Headache   . Heart murmur    asymptomatic   . History of abnormal cervical Pap smear   . History of kidney stones   . Hypertension   . Irritable bowel   . Pinched nerve in neck   . Uterine fibroid     Past surgical history:  Past Surgical History:  Procedure Laterality Date  . BUNIONECTOMY Left   . CESAREAN SECTION  08/31/2011   Procedure: CESAREAN SECTION;  Surgeon: Farrel Gobble. Harrington Challenger, MD;  Location: [facility];  Service: Gynecology;  Laterality: N/A;  . COLONOSCOPY  last one 2015  . CYSTOSCOPY  11/06/2018   Procedure: CYSTOSCOPY;  Surgeon: Bobbye Charleston, MD;  Location: [facility];  Service: Gynecology;;  . [place] N/A 01/13/2015   Procedure: DILATATION & CURETTAGE/HYSTEROSCOPY WITH  MYOSURE;  Surgeon: Vanessa Kick, MD;  Location: [facility];  Service: Gynecology;  Laterality: N/A;  . ganglion cyst removed Right   . hiatal hernia repair  as child  . LEEP  1990's  . MENISCUS REPAIR Right 6 yrs ago  . MYOMECTOMY ABDOMINAL APPROACH  02-15-2005  . NORPLANT REMOVAL Left 11/06/2018   Procedure: REMOVAL OF NEXPLANON;  Surgeon: Bobbye Charleston, MD;  Location: [facility];  Service: Gynecology;  Laterality: Left;  . ROBOT ASSISTED MYOMECTOMY  03/ 2012  . ROBOTIC ASSISTED LAPAROSCOPIC HYSTERECTOMY AND SALPINGECTOMY Bilateral 11/06/2018   Procedure: XI ROBOTIC ASSISTED LAPAROSCOPIC HYSTERECTOMY WITH BILATERAL SALPINGECTOMY;  Surgeon: Bobbye Charleston, MD;  Location: [place];  Service: Gynecology;  Laterality: Bilateral;    Family history: Family History  Problem Relation Age of Onset  . Hypertension Mother   . Asthma Mother   .  Bronchitis Mother   . Kidney disease Father   . Asthma Father   . Eczema Sister   . Sinusitis Sister   . Asthma Maternal Grandmother   . Eczema Maternal Grandmother   . Bronchitis Maternal Grandmother   . Allergic rhinitis Son   . Eczema Son   . Urticaria Neg Hx   . Immunodeficiency Neg Hx   . Angioedema Neg Hx     Social history: Social History   Socioeconomic History  . Marital status: Single    Spouse name: Not on file  . Number of children: Not on file  . Years of education: Not on file  . Highest education level: Not on file  Occupational History  . Not on file  Social Needs  . Financial resource strain: Not on file  . Food insecurity    Worry: Not on file    Inability: Not on file  . Transportation needs    Medical: Not on file    Non-medical: Not on file  Tobacco Use  . Smoking status: Passive Smoke Exposure - Never Smoker  . Smokeless tobacco: Never Used  Substance and Sexual Activity  . Alcohol use: Yes    Comment: occasional  . Drug use: No  .  Sexual activity: Yes    Birth control/protection: Pill, Implant    Comment: to be removed during surgery  Lifestyle  . Physical activity    Days per week: Not on file    Minutes per session: Not on file  . Stress: Not on file  Relationships  . Social Herbalist on phone: Not on file    Gets together: Not on file    Attends religious service: Not on file    Active member of club or organization: Not on file    Attends meetings of clubs or organizations: Not on file    Relationship status: Not on file  . Intimate partner violence    Fear of current or ex partner: Not on file    Emotionally abused: Not on file    Physically abused: Not on file    Forced sexual activity: Not on file  Other Topics Concern  . Not on file  Social History Narrative  . Not on file    Environmental History: The patient lives in a 43 year old house with carpeting throughout and central air/heat.  There is no known  mold/water damage in the home.  There are no pets in the home.  Current Outpatient Medications  Medication Sig Dispense Refill  . amLODipine (NORVASC) 10 MG tablet TAKE ONE-HALF TABLET BY MOUTH DAILY FOR 3 DAYS. INCREASE TO 1 TABLET BY MOUTH DAILY IF TOLERANT    . clonazePAM (KLONOPIN) 0.25 MG disintegrating tablet Take 0.25 mg by mouth 2 (two) times daily.    Marland Kitchen diltiazem 2 % GEL Apply 1 application topically 2 (two) times daily.    . DULoxetine (CYMBALTA) 60 MG capsule TAKE ONE CAPSULE BY MOUTH DAILY    . dupilumab (DUPIXENT) 200 MG/1.14ML prefilled syringe Inject 300 mg into the skin every 14 (fourteen) days. For excema    . escitalopram (LEXAPRO) 10 MG tablet Take 1/2 tablet daily for one week then take 1 tablet daily    . esomeprazole (NEXIUM) 40 MG capsule Take 40 mg by mouth 2 (two) times daily before a meal.    . hydrochlorothiazide (HYDRODIURIL) 25 MG tablet TAKE ONE TABLET BY MOUTH DAILY    . Iron Polysacch Cmplx-B12-FA (IFEREX 150 FORTE) 150-0.025-1 MG CAPS Take by mouth.    . mesalamine (LIALDA) 1.2 g EC tablet Take 2.4 g by mouth at bedtime.     . metoprolol succinate (TOPROL-XL) 100 MG 24 hr tablet Take 100 mg by mouth at bedtime. Take with or immediately following a meal.     . potassium chloride (K-DUR) 10 MEQ tablet Take 10 mEq by mouth daily.    . promethazine (PHENERGAN) 25 MG tablet promethazine 25 mg tablet  Take 1 tablet every 4-6 hours by oral route as needed.    Marland Kitchen QUEtiapine (SEROQUEL) 50 MG tablet Take 1/2 tablet at bedtime for one week then take one tablet at bedtime    . rosuvastatin (CRESTOR) 10 MG tablet Take by mouth.    . SSD 1 % cream     . tacrolimus (PROTOPIC) 0.1 % ointment tacrolimus 0.1 % topical ointment    . triamcinolone cream (KENALOG) 0.1 % Use sparingly to affected ares twice daily as needed (Patient taking differently: Apply 1 application topically 2 (two) times daily as needed (itching). ) 30 g 3  . trimethoprim-polymyxin b (POLYTRIM) ophthalmic  solution Place 1 drop into both eyes 3 times/day as needed-between meals & bedtime.     . Albuterol Sulfate (PROAIR RESPICLICK) 123XX123 (  90 Base) MCG/ACT AEPB Inhale 2 puffs into the lungs every 4 (four) hours as needed. (Patient not taking: Reported on 03/19/2019) 1 each 2  . Albuterol Sulfate (PROAIR RESPICLICK) 123XX123 (90 Base) MCG/ACT AEPB Inhale 1-2 puffs into the lungs every 4 (four) hours as needed. For cough or wheeze 1 each 2  . Azelastine HCl 0.15 % SOLN Place 1 spray into both nostrils 2 (two) times daily as needed. 30 mL 5  . budesonide-formoterol (SYMBICORT) 160-4.5 MCG/ACT inhaler Inhale 2 puffs into the lungs 2 (two) times daily as needed (shortness of breath).     . ferrous sulfate 325 (65 FE) MG tablet Take 325 mg by mouth at bedtime.     . fluticasone (FLONASE) 50 MCG/ACT nasal spray Place 2 sprays into both nostrils daily as needed for allergies or rhinitis. (Patient not taking: Reported on 03/19/2019) 16 g 5  . Ginger, Zingiber officinalis, (GINGER ROOT) 550 MG CAPS Take 550 mg by mouth daily.    Marland Kitchen ibuprofen (ADVIL) 800 MG tablet Take 1 tablet (800 mg total) by mouth every 8 (eight) hours. (Patient not taking: Reported on 03/19/2019) 30 tablet 0  . ibuprofen (ADVIL,MOTRIN) 600 MG tablet Take 1 tablet (600 mg total) by mouth every 6 (six) hours as needed. (Patient not taking: Reported on 06/26/2018) 90 tablet 0  . levocetirizine (XYZAL) 5 MG tablet Take 1 tablet (5 mg total) by mouth every evening. (Patient not taking: Reported on 03/19/2019) 30 tablet 5  . montelukast (SINGULAIR) 10 MG tablet Take 10 mg by mouth at bedtime.    Marland Kitchen oxyCODONE-acetaminophen (PERCOCET/ROXICET) 5-325 MG tablet Take 1 tablet by mouth every 4 (four) hours as needed for severe pain. (Patient not taking: Reported on 03/19/2019) 30 tablet 0  . Turmeric 500 MG TABS Take 500 mg by mouth daily.     No current facility-administered medications for this visit.     Known medication allergies: Allergies  Allergen Reactions   . Aciphex [Rabeprazole Sodium] Hives  . Lisinopril Swelling    Facial   . Iodinated Diagnostic Agents Itching and Hives    I appreciate the opportunity to take part in Samayra's care. Please do not hesitate to contact me with questions.  Sincerely,   R. Edgar Frisk, MD

## 2019-03-19 NOTE — Assessment & Plan Note (Signed)
   A prescription has been provided for ProAir Respiclick, 1-2 inhalations every 4-6 hours as needed.  Subjective and objective measures of pulmonary function will be followed and the treatment plan will be adjusted accordingly.

## 2019-03-19 NOTE — Patient Instructions (Addendum)
Mild intermittent asthma  A prescription has been provided for ProAir Respiclick, 1-2 inhalations every 4-6 hours as needed.  Subjective and objective measures of pulmonary function will be followed and the treatment plan will be adjusted accordingly.  Perennial allergic rhinitis with a possible nonallergic component  Aeroallergen avoidance measures have been discussed and provided in written form.  A prescription has been provided for azelastine/fluticasone nasal spray, 1 spray per nostril 2 times daily as needed. Proper nasal spray technique has been discussed and demonstrated.   Nasal saline spray (i.e., Simply Saline) or nasal saline lavage (i.e., NeilMed) is recommended as needed and prior to medicated nasal sprays.   Return in about 6 months (around 09/17/2019), or if symptoms worsen or fail to improve.  Control of House Dust Mite Allergen  House dust mites play a major role in allergic asthma and rhinitis.  They occur in environments with high humidity wherever human skin, the food for dust mites is found. High levels have been detected in dust obtained from mattresses, pillows, carpets, upholstered furniture, bed covers, clothes and soft toys.  The principal allergen of the house dust mite is found in its feces.  A gram of dust may contain 1,000 mites and 250,000 fecal particles.  Mite antigen is easily measured in the air during house cleaning activities.    1. Encase mattresses, including the box spring, and pillow, in an air tight cover.  Seal the zipper end of the encased mattresses with wide adhesive tape. 2. Wash the bedding in water of 130 degrees Farenheit weekly.  Avoid cotton comforters/quilts and flannel bedding: the most ideal bed covering is the dacron comforter. 3. Remove all upholstered furniture from the bedroom. 4. Remove carpets, carpet padding, rugs, and non-washable window drapes from the bedroom.  Wash drapes weekly or use plastic window coverings. 5. Remove all  non-washable stuffed toys from the bedroom.  Wash stuffed toys weekly. 6. Have the room cleaned frequently with a vacuum cleaner and a damp dust-mop.  The patient should not be in a room which is being cleaned and should wait 1 hour after cleaning before going into the room. 7. Close and seal all heating outlets in the bedroom.  Otherwise, the room will become filled with dust-laden air.  An electric heater can be used to heat the room. 8. Reduce indoor humidity to less than 50%.  Do not use a humidifier.  Control of Mold Allergen  Mold and fungi can grow on a variety of surfaces provided certain temperature and moisture conditions exist.  Outdoor molds grow on plants, decaying vegetation and soil.  The major outdoor mold, Alternaria and Cladosporium, are found in very high numbers during hot and dry conditions.  Generally, a late Summer - Fall peak is seen for common outdoor fungal spores.  Rain will temporarily lower outdoor mold spore count, but counts rise rapidly when the rainy period ends.  The most important indoor molds are Aspergillus and Penicillium.  Dark, humid and poorly ventilated basements are ideal sites for mold growth.  The next most common sites of mold growth are the bathroom and the kitchen.  Outdoor [employer] 1. Use air conditioning and keep windows closed 2. Avoid exposure to decaying vegetation. 3. Avoid leaf raking. 4. Avoid grain handling. 5. Consider wearing a face mask if working in moldy areas.  Indoor Mold Control 1. Maintain humidity below 50%. 2. Clean washable surfaces with 5% bleach solution. 3. Remove sources e.g. Contaminated carpets.

## 2019-06-11 DIAGNOSIS — F411 Generalized anxiety disorder: Secondary | ICD-10-CM | POA: Insufficient documentation

## 2019-09-01 DIAGNOSIS — J310 Chronic rhinitis: Secondary | ICD-10-CM | POA: Insufficient documentation

## 2019-09-17 ENCOUNTER — Other Ambulatory Visit: Payer: Self-pay

## 2019-09-17 ENCOUNTER — Encounter: Payer: Self-pay | Admitting: Allergy and Immunology

## 2019-09-17 ENCOUNTER — Ambulatory Visit: Payer: 59 | Admitting: Allergy and Immunology

## 2019-09-17 ENCOUNTER — Other Ambulatory Visit: Payer: Self-pay | Admitting: Allergy and Immunology

## 2019-09-17 VITALS — BP 122/72 | HR 75 | Temp 98.2°F | Resp 16

## 2019-09-17 DIAGNOSIS — J3089 Other allergic rhinitis: Secondary | ICD-10-CM | POA: Diagnosis not present

## 2019-09-17 DIAGNOSIS — K219 Gastro-esophageal reflux disease without esophagitis: Secondary | ICD-10-CM | POA: Insufficient documentation

## 2019-09-17 DIAGNOSIS — J4530 Mild persistent asthma, uncomplicated: Secondary | ICD-10-CM | POA: Diagnosis not present

## 2019-09-17 MED ORDER — FAMOTIDINE 20 MG PO TABS: 20.0000 mg | ORAL_TABLET | Freq: Two times a day (BID) | ORAL | 5 refills | Status: DC

## 2019-09-17 MED ORDER — ALBUTEROL SULFATE HFA 108 (90 BASE) MCG/ACT IN AERS: 2.0000 | INHALATION_SPRAY | RESPIRATORY_TRACT | 2 refills | Status: DC | PRN

## 2019-09-17 MED ORDER — FLOVENT HFA 110 MCG/ACT IN AERO: 2.0000 | INHALATION_SPRAY | Freq: Two times a day (BID) | RESPIRATORY_TRACT | 5 refills | Status: DC

## 2019-09-17 MED ORDER — FLUTICASONE PROPIONATE 50 MCG/ACT NA SUSP: 2.0000 | Freq: Every day | NASAL | 5 refills | Status: DC | PRN

## 2019-09-17 MED ORDER — AZELASTINE HCL 0.1 % NA SOLN: 1.0000 | Freq: Two times a day (BID) | NASAL | 5 refills | Status: DC | PRN

## 2019-09-17 NOTE — Assessment & Plan Note (Signed)
   A prescription has been provided for Flovent 110 g.  During respiratory tract infections or asthma flares, add Flovent 110g 2 inhalations 2 times per day until symptoms have returned to baseline.  To maximize pulmonary deposition, a spacer has been provided along with instructions for its proper administration with an HFA inhaler.  Continue albuterol HFA, 1 to 2 inhalations every 4-6 hours if needed.  Subjective and objective measures of pulmonary function will be followed and the treatment plan will be adjusted accordingly.

## 2019-09-17 NOTE — Assessment & Plan Note (Addendum)
   Continue appropriate aeroallergen avoidance measures.  Continue azelastine/fluticasone nasal spray, 1 spray per nostril 2 times daily as needed.   Nasal saline spray (i.e., Simply Saline) or nasal saline lavage (i.e., NeilMed) is recommended as needed and prior to medicated nasal sprays.  For thick post nasal drainage, add guaifenesin 1200 mg (Mucinex Maximum Strength)  twice daily as needed with adequate hydration as discussed.

## 2019-09-17 NOTE — Progress Notes (Signed)
Follow-up Note  RE: Colleen Baker MRN: [id number] DOB: Jul 22, 1975 Date of Office Visit: 09/17/2019  Primary care provider: Daylene Posey, [place] Referring provider: Daylene Posey, FNP  History of present illness: Colleen Baker is a 44 y.o. female with asthma and mixed rhinitis presenting today for follow-up.  She was previously seen in this clinic for initial evaluation in December 2020.  She reports that her asthma has been doing well until approximately 1 week ago.  Over the past week she has been experiencing more frequent chest tightness, dyspnea, and wheezing requiring albuterol rescue.  She has also noticed a globus sensation in the lower part of her throat and increased frequency of heartburn breakthrough and frequent belching despite compliance with esomeprazole 40 mg twice daily. She reports that she has been experiencing thick postnasal drainage and that her nose is "a little stuffy sometimes."  Assessment and plan: Mild persistent asthma  A prescription has been provided for Flovent 110 g.  During respiratory tract infections or asthma flares, add Flovent 110g 2 inhalations 2 times per day until symptoms have returned to baseline.  To maximize pulmonary deposition, a spacer has been provided along with instructions for its proper administration with an HFA inhaler.  Continue albuterol HFA, 1 to 2 inhalations every 4-6 hours if needed.  Subjective and objective measures of pulmonary function will be followed and the treatment plan will be adjusted accordingly.  GERD (gastroesophageal reflux disease) Currently with suboptimal control.  Appropriate reflux lifestyle modifications have been provided.  Continue esomeprazole as prescribed.  A prescription has been provided for famotidine (Pepcid) 20 mg twice daily.  Follow-up with gastroenterologist for further evaluation/recommendations.  Perennial allergic rhinitis with a possible nonallergic component  Continue  appropriate aeroallergen avoidance measures.  Continue azelastine/fluticasone nasal spray, 1 spray per nostril 2 times daily as needed.   Nasal saline spray (i.e., Simply Saline) or nasal saline lavage (i.e., NeilMed) is recommended as needed and prior to medicated nasal sprays.  For thick post nasal drainage, add guaifenesin 1200 mg (Mucinex Maximum Strength)  twice daily as needed with adequate hydration as discussed.   Meds ordered this encounter  Medications  . fluticasone (FLOVENT HFA) 110 MCG/ACT inhaler    Sig: Inhale 2 puffs into the lungs 2 (two) times daily. Use during asthma flares.    Dispense:  1 Inhaler    Refill:  5  . famotidine (PEPCID) 20 MG tablet    Sig: Take 1 tablet (20 mg total) by mouth 2 (two) times daily.    Dispense:  60 tablet    Refill:  5  . fluticasone (FLONASE) 50 MCG/ACT nasal spray    Sig: Place 2 sprays into both nostrils daily as needed.    Dispense:  16 g    Refill:  5  . azelastine (ASTELIN) 0.1 % nasal spray    Sig: Place 1-2 sprays into both nostrils 2 (two) times daily as needed.    Dispense:  30 mL    Refill:  5  . albuterol (PROAIR HFA) 108 (90 Base) MCG/ACT inhaler    Sig: Inhale 2 puffs into the lungs every 4 (four) hours as needed for wheezing or shortness of breath.    Dispense:  8 g    Refill:  2    Replacing proair respiclick    Diagnostics: Spirometry:  Normal with an FEV1 of 90% predicted. This study was performed while the patient was asymptomatic.  Please see scanned spirometry results for details.    Physical  examination: Blood pressure 122/72, pulse 75, temperature 98.2 F (36.8 C), temperature source Oral, resp. rate 16, SpO2 100 %.  General: Alert, interactive, in no acute distress. HEENT: TMs pearly gray, turbinates moderately edematous without discharge, post-pharynx moderately erythematous. Neck: Supple without lymphadenopathy. Lungs: Clear to auscultation without wheezing, rhonchi or rales. CV: Normal S1, S2  without murmurs. Skin: Warm and dry, without lesions or rashes.  The following portions of the patient's history were reviewed and updated as appropriate: allergies, current medications, past family history, past medical history, past social history, past surgical history and problem list.  Current Outpatient Medications  Medication Sig Dispense Refill  . albuterol (PROAIR HFA) 108 (90 Base) MCG/ACT inhaler Inhale 2 puffs into the lungs every 4 (four) hours as needed for wheezing or shortness of breath. 8 g 2  . Albuterol Sulfate (PROAIR RESPICLICK) 123XX123 (90 Base) MCG/ACT AEPB Inhale 2 puffs into the lungs every 4 (four) hours as needed. 1 each 2  . Albuterol Sulfate (PROAIR RESPICLICK) 123XX123 (90 Base) MCG/ACT AEPB Inhale 1-2 puffs into the lungs every 4 (four) hours as needed. For cough or wheeze 1 each 2  . amLODipine (NORVASC) 10 MG tablet TAKE ONE-HALF TABLET BY MOUTH DAILY FOR 3 DAYS. INCREASE TO 1 TABLET BY MOUTH DAILY IF TOLERANT    . budesonide-formoterol (SYMBICORT) 160-4.5 MCG/ACT inhaler Inhale 2 puffs into the lungs 2 (two) times daily as needed (shortness of breath).     . clonazePAM (KLONOPIN) 0.25 MG disintegrating tablet Take 0.25 mg by mouth 2 (two) times daily.    . DULoxetine (CYMBALTA) 60 MG capsule TAKE ONE CAPSULE BY MOUTH DAILY    . dupilumab (DUPIXENT) 200 MG/1.14ML prefilled syringe Inject 300 mg into the skin every 14 (fourteen) days. For excema    . escitalopram (LEXAPRO) 10 MG tablet Take 1/2 tablet daily for one week then take 1 tablet daily    . esomeprazole (NEXIUM) 40 MG capsule Take 40 mg by mouth 2 (two) times daily before a meal.    . ferrous sulfate 325 (65 FE) MG tablet Take 325 mg by mouth at bedtime.     . fluticasone (FLONASE) 50 MCG/ACT nasal spray Place 2 sprays into both nostrils daily as needed. 16 g 5  . hydrochlorothiazide (HYDRODIURIL) 25 MG tablet TAKE ONE TABLET BY MOUTH DAILY    . ibuprofen (ADVIL) 800 MG tablet Take 1 tablet (800 mg total) by mouth  every 8 (eight) hours. 30 tablet 0  . ibuprofen (ADVIL,MOTRIN) 600 MG tablet Take 1 tablet (600 mg total) by mouth every 6 (six) hours as needed. 90 tablet 0  . Iron Polysacch Cmplx-B12-FA (IFEREX 150 FORTE) 150-0.025-1 MG CAPS Take by mouth.    . metoprolol succinate (TOPROL-XL) 100 MG 24 hr tablet Take 100 mg by mouth at bedtime. Take with or immediately following a meal.     . montelukast (SINGULAIR) 10 MG tablet Take 10 mg by mouth at bedtime.    . potassium chloride (K-DUR) 10 MEQ tablet Take 10 mEq by mouth daily.    . rosuvastatin (CRESTOR) 10 MG tablet Take by mouth.    . SSD 1 % cream     . tacrolimus (PROTOPIC) 0.1 % ointment tacrolimus 0.1 % topical ointment    . triamcinolone cream (KENALOG) 0.1 % Use sparingly to affected ares twice daily as needed (Patient taking differently: Apply 1 application topically 2 (two) times daily as needed (itching). ) 30 g 3  . trimethoprim-polymyxin b (POLYTRIM) ophthalmic solution Place 1  drop into both eyes 3 times/day as needed-between meals & bedtime.     Marland Kitchen azelastine (ASTELIN) 0.1 % nasal spray Place 1-2 sprays into both nostrils 2 (two) times daily as needed. 30 mL 5  . famotidine (PEPCID) 20 MG tablet Take 1 tablet (20 mg total) by mouth 2 (two) times daily. 60 tablet 5  . fluticasone (FLOVENT HFA) 110 MCG/ACT inhaler Inhale 2 puffs into the lungs 2 (two) times daily. Use during asthma flares. 1 Inhaler 5  . levocetirizine (XYZAL) 5 MG tablet Take 1 tablet (5 mg total) by mouth every evening. (Patient not taking: Reported on 09/17/2019) 30 tablet 5  . mesalamine (LIALDA) 1.2 g EC tablet Take 2.4 g by mouth at bedtime.      No current facility-administered medications for this visit.    Allergies  Allergen Reactions  . Aciphex [Rabeprazole Sodium] Hives  . Lisinopril Swelling    Facial   . Iodinated Diagnostic Agents Itching and Hives   Review of systems: Review of systems negative except as noted in HPI / PMHx.  Past Medical History:   Diagnosis Date  . Anemia    allergy induced  . Anxiety   . Arthritis   . Asthma with allergic rhinitis   . Chronic back pain   . Crohn's disease in remission ([place])   . Depression   . Eczema   . GERD (gastroesophageal reflux disease)   . Headache   . Heart murmur    asymptomatic   . History of abnormal cervical Pap smear   . History of kidney stones   . Hypertension   . Irritable bowel   . Pinched nerve in neck   . Uterine fibroid     Family History  Problem Relation Age of Onset  . Hypertension Mother   . Asthma Mother   . Bronchitis Mother   . Kidney disease Father   . Asthma Father   . Eczema Sister   . Sinusitis Sister   . Asthma Maternal Grandmother   . Eczema Maternal Grandmother   . Bronchitis Maternal Grandmother   . Allergic rhinitis Son   . Eczema Son   . Urticaria Neg Hx   . Immunodeficiency Neg Hx   . Angioedema Neg Hx     Social History   Socioeconomic History  . Marital status: Single    Spouse name: Not on file  . Number of children: Not on file  . Years of education: Not on file  . Highest education level: Not on file  Occupational History  . Not on file  Tobacco Use  . Smoking status: Passive Smoke Exposure - Never Smoker  . Smokeless tobacco: Never Used  Substance and Sexual Activity  . Alcohol use: Yes    Comment: occasional  . Drug use: No  . Sexual activity: Yes    Birth control/protection: Pill, Implant    Comment: to be removed during surgery  Other Topics Concern  . Not on file  Social History Narrative  . Not on file   Social Determinants of Health   Financial Resource Strain:   . Difficulty of Paying Living Expenses:   Food Insecurity:   . Worried About Charity fundraiser in the Last Year:   . Arboriculturist in the Last Year:   Transportation Needs:   . Film/video editor (Medical):   Marland Kitchen Lack of Transportation (Non-Medical):   Physical Activity:   . Days of Exercise per Week:   . Minutes  of Exercise per  Session:   Stress:   . Feeling of Stress :   Social Connections:   . Frequency of Communication with Friends and Family:   . Frequency of Social Gatherings with Friends and Family:   . Attends Religious Services:   . Active Member of Clubs or Organizations:   . Attends Archivist Meetings:   Marland Kitchen Marital Status:   Intimate Partner Violence:   . Fear of Current or Ex-Partner:   . Emotionally Abused:   Marland Kitchen Physically Abused:   . Sexually Abused:     I appreciate the opportunity to take part in Ashleigh's care. Please do not hesitate to contact me with questions.  Sincerely,   R. Edgar Frisk, MD

## 2019-09-17 NOTE — Patient Instructions (Addendum)
Mild persistent asthma  A prescription has been provided for Flovent 110 g.  During respiratory tract infections or asthma flares, add Flovent 110g 2 inhalations 2 times per day until symptoms have returned to baseline.  To maximize pulmonary deposition, a spacer has been provided along with instructions for its proper administration with an HFA inhaler.  Continue albuterol HFA, 1 to 2 inhalations every 4-6 hours if needed.  Subjective and objective measures of pulmonary function will be followed and the treatment plan will be adjusted accordingly.  GERD (gastroesophageal reflux disease) Currently with suboptimal control.  Appropriate reflux lifestyle modifications have been provided.  Continue esomeprazole as prescribed.  A prescription has been provided for famotidine (Pepcid) 20 mg twice daily.  Follow-up with gastroenterologist for further evaluation/recommendations.  Perennial allergic rhinitis with a possible nonallergic component  Continue appropriate aeroallergen avoidance measures.  Continue azelastine/fluticasone nasal spray, 1 spray per nostril 2 times daily as needed.   Nasal saline spray (i.e., Simply Saline) or nasal saline lavage (i.e., NeilMed) is recommended as needed and prior to medicated nasal sprays.  For thick post nasal drainage, add guaifenesin 1200 mg (Mucinex Maximum Strength)  twice daily as needed with adequate hydration as discussed.   Return in about 4 months (around 01/17/2020), or if symptoms worsen or fail to improve.  Lifestyle Changes for Controlling GERD  When you have GERD, stomach acid feels as if it's backing up toward your mouth. Whether or not you take medication to control your GERD, your symptoms can often be improved with lifestyle changes.   Raise Your Head  Reflux is more likely to strike when you're lying down flat, because stomach fluid can  flow backward more easily. Raising the head of your bed 4-6 inches can help. To do  this:  Slide blocks or books under the legs at the head of your bed. Or, place a wedge under  the mattress. Many foam stores can make a suitable wedge for you. The wedge  should run from your waist to the top of your head.  Don't just prop your head on several pillows. This increases pressure on your  stomach. It can make GERD worse.  Watch Your Eating Habits Certain foods may increase the acid in your stomach or relax the lower esophageal sphincter, making GERD more likely. It's best to avoid the following:  Coffee, tea, and carbonated drinks (with and without caffeine)  Fatty, fried, or spicy food  Mint, chocolate, onions, and tomatoes  Any other foods that seem to irritate your stomach or cause you pain  Relieve the Pressure  Eat smaller meals, even if you have to eat more often.  Don't lie down right after you eat. Wait a few hours for your stomach to empty.  Avoid tight belts and tight-fitting clothes.  Lose excess weight.  Tobacco and Alcohol  Avoid smoking tobacco and drinking alcohol. They can make GERD symptoms worse.

## 2019-09-17 NOTE — Assessment & Plan Note (Signed)
Currently with suboptimal control.  Appropriate reflux lifestyle modifications have been provided.  Continue esomeprazole as prescribed.  A prescription has been provided for famotidine (Pepcid) 20 mg twice daily.  Follow-up with gastroenterologist for further evaluation/recommendations.

## 2019-09-17 NOTE — Telephone Encounter (Signed)
Patients insurance will not cover Flonase and are requesting Nasalide or Nasonex. Please advise.

## 2019-09-22 ENCOUNTER — Telehealth: Payer: Self-pay | Admitting: *Deleted

## 2019-09-22 NOTE — Telephone Encounter (Signed)
Pt called saying her Flovent was going to be over $100 and would like an alternative. Flovent is a tier 1 on her formulary. I will call the pharmacy tomorrow.

## 2019-09-26 NOTE — Telephone Encounter (Signed)
I spoke with the pharmacy and her [employer] insurance requires her to fill for 90 days, so the Flovent for 90 days is $125. So its technically $41/ month. Pharmacist did check pricing for qvar as well and it is $40/month.

## 2019-09-29 ENCOUNTER — Other Ambulatory Visit: Payer: Self-pay

## 2019-09-29 MED ORDER — FLOVENT HFA 110 MCG/ACT IN AERO: 2.0000 | INHALATION_SPRAY | Freq: Two times a day (BID) | RESPIRATORY_TRACT | 1 refills | Status: DC

## 2019-09-29 NOTE — Telephone Encounter (Signed)
Sent in 3 month supply and informed pt of this she stated understanding

## 2019-09-29 NOTE — Telephone Encounter (Signed)
Ok, let's go with the flovent 90 day. Thanks.

## 2019-10-01 ENCOUNTER — Other Ambulatory Visit: Payer: Self-pay

## 2019-10-01 MED ORDER — FLUNISOLIDE 25 MCG/ACT (0.025%) NA SOLN: 1.0000 | Freq: Two times a day (BID) | NASAL | 3 refills | Status: DC | PRN

## 2020-01-20 ENCOUNTER — Ambulatory Visit: Payer: 59 | Admitting: Allergy and Immunology

## 2020-01-21 NOTE — Patient Instructions (Addendum)
Mild persistent asthma Start Flovent 110 mcg 2 puffs twice a day with spacer now. Use until symptoms return to baseline. For asthma flares start Flovent 110 mcg 2 puffs twice a day with spacer to help prevent cough and wheeze May use albuterol 2 puffs every 4 hours as needed for cough, wheeze, tightness in chest, or shortness of breath albuterol 2 puffs 5 to 15 minutes prior to exercise  Reflux Continue famotidine 20 mg twice a day Continue to follow-up with GI  Perennial allergic rhinitis with a possible nonallergic component Continue avoidance measures Continue azelastine/fluticasone nasal spray using 1 spray each nostril twice a day as needed May use saline nasal spray or saline nasal rinse as needed for nasal symptoms.  Use this prior to any medicated nasal sprays  Please let us know if this treatment plan is not working well for you. Schedule follow-up appointment in 3 months

## 2020-01-22 ENCOUNTER — Ambulatory Visit (INDEPENDENT_AMBULATORY_CARE_PROVIDER_SITE_OTHER): Payer: 59 | Admitting: Family

## 2020-01-22 ENCOUNTER — Encounter: Payer: Self-pay | Admitting: Family

## 2020-01-22 ENCOUNTER — Other Ambulatory Visit: Payer: Self-pay

## 2020-01-22 VITALS — BP 124/80 | HR 82 | Temp 97.7°F | Resp 16

## 2020-01-22 DIAGNOSIS — J3089 Other allergic rhinitis: Secondary | ICD-10-CM | POA: Diagnosis not present

## 2020-01-22 DIAGNOSIS — K219 Gastro-esophageal reflux disease without esophagitis: Secondary | ICD-10-CM | POA: Diagnosis not present

## 2020-01-22 DIAGNOSIS — J4530 Mild persistent asthma, uncomplicated: Secondary | ICD-10-CM | POA: Diagnosis not present

## 2020-01-22 NOTE — Progress Notes (Signed)
[address] [place] [address] Dept: [phone]  FOLLOW UP NOTE  Patient ID: Colleen Baker, female    DOB: [birth year]  Age: 44 y.o. MRN: [id number] Date of Office Visit: 01/22/2020  Assessment  Chief Complaint: Asthma  HPI Colleen Baker is a 44 year old female who presents today for follow-up of mild persistent asthma, gastroesophageal reflux disease, and perennial allergic rhinitis with a possible nonallergic component.  She was last seen on September 17, 2019 by Dr. Verlin Fester.  Mild persistent asthma is reported as moderately controlled with albuterol as needed.  She reports for the past 2 weeks she has had a dry cough and shortness of breath some tightness in her chest.  She denies any wheezing or nocturnal awakenings.  She has not started her Flovent 110 mcg for this flareup.  She has been using her albuterol approximately 2 times a week.  Since her last office visit she has not required any systemic steroids or made any trips to the emergency room or urgent care due to breathing problems.  She also mentions that it has been 5 weeks since she has had a Dupixent injection for her eczema due to issues with her insurance and this may be some of the cause of her asthma flare.  Gastroesophageal reflux disease is reported as moderately controlled with famotidine 20 mg twice a day.  She reports that she is no longer taking the esomeprazole.  She reports that she had a swallow study and has an appointment with a specialist at [facility] in December of this year.  Perennial allergic rhinitis with possible nonallergic component is reported as moderately controlled with azelastine/fluticasone nasal spray as needed and as needed saline spray.  She reports occasional rhinorrhea and postnasal drip and denies any nasal congestion.  Current medications are as listed in the chart.  Drug Allergies:  Allergies  Allergen Reactions  . Aciphex [Rabeprazole Sodium] Hives  . Lisinopril Swelling    Facial   .  Iodinated Diagnostic Agents Hives and Itching    Contrast dye    Review of Systems: Review of Systems  Constitutional: Negative for chills and fever.  HENT:       Reports occasional rhinorrhea and post nasal drip. Denies nasal congestion  Eyes:       Denies itchy eyes. Reports dry watery eyes and has an appointment today with her eye doctor  Respiratory: Positive for cough and shortness of breath. Negative for wheezing.   Cardiovascular: Positive for palpitations. Negative for chest pain.       Reports palpitations with anxieity  Gastrointestinal: Negative for abdominal pain.  Genitourinary: Negative for dysuria.  Skin: Positive for itching.  Neurological: Positive for headaches.       Reports occasional headaches  Endo/Heme/Allergies: Positive for environmental allergies.    Physical Exam: BP 124/80   Pulse 82   Temp 97.7 F (36.5 C) (Tympanic)   Resp 16   SpO2 99%    Physical Exam Constitutional:      Appearance: Normal appearance.  HENT:     Head: Normocephalic and atraumatic.     Comments: Pharynx normal. Eyes normal. Nose normal. Ears: unable to visualize left ear tympanic membrane due to cerumen. Right ear normal.    Right Ear: Tympanic membrane, ear canal and external ear normal.     Left Ear: Ear canal and external ear normal.     Nose: Nose normal.     Mouth/Throat:     Mouth: Mucous membranes are moist.  Pharynx: Oropharynx is clear.  Eyes:     Conjunctiva/sclera: Conjunctivae normal.  Cardiovascular:     Rate and Rhythm: Normal rate and regular rhythm.     Heart sounds: Normal heart sounds.  Pulmonary:     Effort: Pulmonary effort is normal.     Breath sounds: Normal breath sounds.     Comments: Lungs clear to auscultation Musculoskeletal:     Cervical back: Neck supple.  Skin:    General: Skin is warm.  Neurological:     Mental Status: She is alert and oriented to person, place, and time.  Psychiatric:        Mood and Affect: Mood normal.         Behavior: Behavior normal.        Thought Content: Thought content normal.        Judgment: Judgment normal.     Diagnostics: FVC 3.24 L, FEV1 2.81 L.  Predicted FVC 3.73 L, FEV1 3.03 L.  Spirometry indicates normal ventilatory function.  Assessment and Plan: 1. Mild persistent asthma, unspecified whether complicated   2. Perennial allergic rhinitis with a possible nonallergic component   3. Gastroesophageal reflux disease, unspecified whether esophagitis present     No orders of the defined types were placed in this encounter.   Patient Instructions  Mild persistent asthma Start Flovent 110 mcg 2 puffs twice a day with spacer now. Use until symptoms return to baseline. For asthma flares start Flovent 110 mcg 2 puffs twice a day with spacer to help prevent cough and wheeze May use albuterol 2 puffs every 4 hours as needed for cough, wheeze, tightness in chest, or shortness of breath albuterol 2 puffs 5 to 15 minutes prior to exercise  Reflux Continue famotidine 20 mg twice a day Continue to follow-up with GI  Perennial allergic rhinitis with a possible nonallergic component Continue avoidance measures Continue azelastine/fluticasone nasal spray using 1 spray each nostril twice a day as needed May use saline nasal spray or saline nasal rinse as needed for nasal symptoms.  Use this prior to any medicated nasal sprays  Please let us know if this treatment plan is not working well for you. Schedule follow-up appointment in 3 months   Return in about 3 months (around 04/23/2020), or if symptoms worsen or fail to improve.    Thank you for the opportunity to care for this patient.  Please do not hesitate to contact me with questions.  Althea Charon, FNP [facility] [place] [facility]

## 2020-03-01 DIAGNOSIS — R5382 Chronic fatigue, unspecified: Secondary | ICD-10-CM | POA: Insufficient documentation

## 2020-03-01 DIAGNOSIS — G4700 Insomnia, unspecified: Secondary | ICD-10-CM | POA: Insufficient documentation

## 2020-03-02 DIAGNOSIS — E559 Vitamin D deficiency, unspecified: Secondary | ICD-10-CM | POA: Insufficient documentation

## 2020-03-02 DIAGNOSIS — E538 Deficiency of other specified B group vitamins: Secondary | ICD-10-CM | POA: Insufficient documentation

## 2020-04-06 DIAGNOSIS — M170 Bilateral primary osteoarthritis of knee: Secondary | ICD-10-CM | POA: Insufficient documentation

## 2020-04-09 ENCOUNTER — Encounter (HOSPITAL_BASED_OUTPATIENT_CLINIC_OR_DEPARTMENT_OTHER): Payer: Self-pay

## 2020-04-09 ENCOUNTER — Other Ambulatory Visit: Payer: Self-pay

## 2020-04-09 ENCOUNTER — Emergency Department (HOSPITAL_BASED_OUTPATIENT_CLINIC_OR_DEPARTMENT_OTHER)
Admission: EM | Admit: 2020-04-09 | Discharge: 2020-04-09 | Disposition: A | Discharge status: Home / Self Care | Payer: 59 | Attending: Emergency Medicine | Admitting: Emergency Medicine

## 2020-04-09 DIAGNOSIS — M25531 Pain in right wrist: Secondary | ICD-10-CM | POA: Diagnosis present

## 2020-04-09 DIAGNOSIS — J4530 Mild persistent asthma, uncomplicated: Secondary | ICD-10-CM | POA: Diagnosis not present

## 2020-04-09 DIAGNOSIS — G5601 Carpal tunnel syndrome, right upper limb: Secondary | ICD-10-CM | POA: Diagnosis not present

## 2020-04-09 DIAGNOSIS — Z79899 Other long term (current) drug therapy: Secondary | ICD-10-CM | POA: Insufficient documentation

## 2020-04-09 DIAGNOSIS — I10 Essential (primary) hypertension: Secondary | ICD-10-CM | POA: Insufficient documentation

## 2020-04-09 DIAGNOSIS — Z7951 Long term (current) use of inhaled steroids: Secondary | ICD-10-CM | POA: Diagnosis not present

## 2020-04-09 DIAGNOSIS — Z7722 Contact with and (suspected) exposure to environmental tobacco smoke (acute) (chronic): Secondary | ICD-10-CM | POA: Insufficient documentation

## 2020-04-09 NOTE — ED Triage Notes (Signed)
Pt arrives with c/o pain to right arm X 4 days. Denies any injury.

## 2020-04-09 NOTE — Discharge Instructions (Addendum)
I think you most likely have carpal tunnel, as we discussed I want you to take NSAIDs like naproxen for the next 10 days, do not overuse that wrist for the next 2 weeks.  Try and rest as much as possible, use the attached instructions.  I also want you to purchase the carpal tunnel brace that we spoke about, you can get this on [place], this will help overnight.  If you have any new worsening concerning symptoms to come back to the emergency department.  Please follow-up with your primary care or go to your hand appointment on the 11th as scheduled.

## 2020-04-09 NOTE — ED Provider Notes (Signed)
[place] EMERGENCY DEPARTMENT Provider Note   CSN: [id number] Arrival date & time: 04/09/20  [id number]     History Chief Complaint  Patient presents with  . Arm Pain    Colleen Baker is a 44 y.o. female pertinent past medical history of hypertension, irritable bowel, anxiety that presents emergency department today for hand pain.  Patient states that she has been having hand pain that is now radiating up her hand for the past 3 days.  Denies any injury.  Patient states that her thumb and first 2 fingers are numb sometimes, worse at night.  Worse during certain movements, states that she has been working a lot more than normal in the past couple weeks.  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  Is right hand dominant.  No prior injury to this area.  Denies any elbow or shoulder pain.  Denies any fevers, chills, nausea, vomiting.  Denies any numbness and tingling besides the first 3 digits.  Denies any weakness.  No other complaints.  HPI     Past Medical History:  Diagnosis Date  . Anemia    allergy induced  . Anxiety   . Arthritis   . Asthma with allergic rhinitis   . Chronic back pain   . Crohn's disease in remission ([place])   . Depression   . Eczema   . GERD (gastroesophageal reflux disease)   . Headache   . Heart murmur    asymptomatic   . History of abnormal cervical Pap smear   . History of kidney stones   . Hypertension   . Irritable bowel   . Pinched nerve in neck   . Uterine fibroid     Patient Active Problem List   Diagnosis Date Noted  . GERD (gastroesophageal reflux disease) 09/17/2019  . Postoperative state 11/06/2018  . Dermatitis 01/06/2016  . Mild persistent asthma 01/06/2016  . Perennial allergic rhinitis with a possible nonallergic component 01/06/2016  . Atopic dermatitis 01/06/2016    Past Surgical History:  Procedure Laterality Date  . ABDOMINAL  HYSTERECTOMY    . BUNIONECTOMY Left   . CESAREAN SECTION  08/31/2011   Procedure: CESAREAN SECTION;  Surgeon: Farrel Gobble. Harrington Challenger, MD;  Location: [facility];  Service: Gynecology;  Laterality: N/A;  . COLONOSCOPY  last one 2015  . CYSTOSCOPY  11/06/2018   Procedure: CYSTOSCOPY;  Surgeon: Bobbye Charleston, MD;  Location: [facility];  Service: Gynecology;;  . [place] N/A 01/13/2015   Procedure: DILATATION & CURETTAGE/HYSTEROSCOPY WITH  MYOSURE;  Surgeon: Vanessa Kick, MD;  Location: [facility];  Service: Gynecology;  Laterality: N/A;  . ganglion cyst removed Right   . hiatal hernia repair  as child  . LEEP  1990's  . MENISCUS REPAIR Right 6 yrs ago  . MYOMECTOMY ABDOMINAL APPROACH  02-15-2005  . NORPLANT REMOVAL Left 11/06/2018   Procedure: REMOVAL OF NEXPLANON;  Surgeon: Bobbye Charleston, MD;  Location: [facility];  Service: Gynecology;  Laterality: Left;  . ROBOT ASSISTED MYOMECTOMY  03/ 2012  . ROBOTIC ASSISTED LAPAROSCOPIC HYSTERECTOMY AND SALPINGECTOMY Bilateral 11/06/2018   Procedure: XI ROBOTIC ASSISTED LAPAROSCOPIC HYSTERECTOMY WITH BILATERAL SALPINGECTOMY;  Surgeon: Bobbye Charleston, MD;  Location: [facility];  Service: Gynecology;  Laterality: Bilateral;     OB History    Gravida  2   Para  1   Term  1   Preterm  0   AB  1   Living  1     SAB  0   IAB  1   Ectopic  0   Multiple  0   Live Births  1           Family History  Problem Relation Age of Onset  . Hypertension Mother   . Asthma Mother   . Bronchitis Mother   . Kidney disease Father   . Asthma Father   . Eczema Sister   . Sinusitis Sister   . Asthma Maternal Grandmother   . Eczema Maternal Grandmother   . Bronchitis Maternal Grandmother   . Allergic rhinitis Son   . Eczema Son   . Urticaria Neg Hx   . Immunodeficiency Neg Hx   . Angioedema Neg Hx     Social History   Tobacco Use  . Smoking  status: Passive Smoke Exposure - Never Smoker  . Smokeless tobacco: Never Used  Vaping Use  . Vaping Use: Never used  Substance Use Topics  . Alcohol use: Yes    Comment: occasional  . Drug use: No    Home Medications Prior to Admission medications   Medication Sig Start Date End Date Taking? Authorizing Provider  albuterol (PROAIR HFA) 108 (90 Base) MCG/ACT inhaler Inhale 2 puffs into the lungs every 4 (four) hours as needed for wheezing or shortness of breath. 09/17/19   Bobbitt, Sedalia Muta, MD  alclomethasone (ACLOVATE) 0.05 % cream PLEASE SEE ATTACHED FOR DETAILED DIRECTIONS 01/06/20   [provider]  amLODipine (NORVASC) 10 MG tablet TAKE ONE-HALF TABLET BY MOUTH DAILY FOR 3 DAYS. INCREASE TO 1 TABLET BY MOUTH DAILY IF TOLERANT 12/06/18   [provider]  azelastine (ASTELIN) 0.1 % nasal spray Place 1-2 sprays into both nostrils 2 (two) times daily as needed. Patient not taking: Reported on 01/22/2020 09/17/19   Bobbitt, Sedalia Muta, MD  budesonide-formoterol [facility]) 160-4.5 MCG/ACT inhaler Inhale 2 puffs into the lungs 2 (two) times daily as needed (shortness of breath).  Patient not taking: Reported on 01/22/2020 02/21/18   [provider]  clonazePAM (KLONOPIN) 0.25 MG disintegrating tablet Take 0.25 mg by mouth 2 (two) times daily. Patient not taking: Reported on 01/22/2020 02/07/19   [provider]  DULoxetine (CYMBALTA) 60 MG capsule TAKE ONE CAPSULE BY MOUTH DAILY 03/17/19   [provider]  dupilumab (DUPIXENT) 200 MG/1.14ML prefilled syringe Inject 300 mg into the skin every 14 (fourteen) days. For excema Patient not taking: Reported on 01/22/2020    [provider]  escitalopram (LEXAPRO) 10 MG tablet Take 1/2 tablet daily for one week then take 1 tablet daily 02/14/19   [provider]  esomeprazole (NEXIUM) 40 MG capsule Take 40 mg by mouth 2 (two) times daily before a meal. Patient not taking: Reported on  01/22/2020    [provider]  eszopiclone (LUNESTA) 2 MG TABS tablet Take 2 tablets  immediately before bedtime 11/27/19   [provider]  Eszopiclone 3 MG TABS Take 3 mg by mouth at bedtime. 10/05/19   [provider]  famotidine (PEPCID) 20 MG tablet Take 1 tablet (20 mg total) by mouth 2 (two) times daily. 09/17/19   Bobbitt, Sedalia Muta, MD  ferrous sulfate 325 (65 FE) MG tablet Take 325 mg by mouth at bedtime.  Patient not taking: Reported on 01/22/2020    [provider]  flunisolide (NASALIDE) 25 MCG/ACT (0.025%) SOLN Place 1 spray  into the nose 2 (two) times daily as needed. 10/01/19   Bobbitt, Sedalia Muta, MD  fluticasone (FLONASE) 50 MCG/ACT nasal spray as needed. 09/01/19   [provider]  fluticasone (FLOVENT HFA) 110 MCG/ACT inhaler Inhale 2 puffs into the lungs 2 (two) times daily. Use during asthma flares. Patient taking differently: Inhale 2 puffs into the lungs 2 (two) times daily as needed. Use during asthma flares. 09/29/19   Bobbitt, Sedalia Muta, MD  hydrochlorothiazide (HYDRODIURIL) 25 MG tablet TAKE ONE TABLET BY MOUTH DAILY 12/06/18   [provider]  ibuprofen (ADVIL) 800 MG tablet Take 1 tablet (800 mg total) by mouth every 8 (eight) hours. Patient not taking: Reported on 01/22/2020 11/07/18   Bobbye Charleston, MD  levocetirizine (XYZAL) 5 MG tablet Take 1 tablet (5 mg total) by mouth every evening. Patient not taking: Reported on 09/17/2019 01/06/16   Bobbitt, Sedalia Muta, MD  mesalamine (LIALDA) 1.2 g EC tablet Take 2.4 g by mouth at bedtime.     [provider]  metoprolol succinate (TOPROL-XL) 100 MG 24 hr tablet Take 100 mg by mouth at bedtime. Take with or immediately following a meal.  Patient not taking: Reported on 01/22/2020    [provider]  montelukast (SINGULAIR) 10 MG tablet Take 10 mg by mouth at bedtime. Patient not taking: Reported on 01/22/2020    [provider]  potassium  chloride (K-DUR) 10 MEQ tablet Take 10 mEq by mouth daily. Patient not taking: Reported on 01/22/2020    [provider]  rosuvastatin (CRESTOR) 10 MG tablet Take by mouth. Patient not taking: Reported on 01/22/2020 03/03/19   [provider]  SSD 1 % cream  01/21/19   [provider]  tacrolimus (PROTOPIC) 0.1 % ointment tacrolimus 0.1 % topical ointment    [provider]  triamcinolone cream (KENALOG) 0.1 % Use sparingly to affected ares twice daily as needed Patient taking differently: Apply 1 application topically 2 (two) times daily as needed (itching).  01/06/16   Bobbitt, Sedalia Muta, MD  trimethoprim-polymyxin b (POLYTRIM) ophthalmic solution Place 1 drop into both eyes 3 times/day as needed-between meals & bedtime.  Patient not taking: Reported on 01/22/2020    [provider]    Allergies    Aciphex [rabeprazole sodium], Lisinopril, and Iodinated diagnostic agents  Review of Systems   Review of Systems  Constitutional: Negative for diaphoresis, fatigue and fever.  Eyes: Negative for visual disturbance.  Respiratory: Negative for shortness of breath.   Cardiovascular: Negative for chest pain.  Gastrointestinal: Negative for nausea and vomiting.  Musculoskeletal: Positive for arthralgias. Negative for back pain and myalgias.  Skin: Negative for color change, pallor, rash and wound.  Neurological: Negative for syncope, weakness, light-headedness, numbness and headaches.  Psychiatric/Behavioral: Negative for behavioral problems and confusion.    Physical Exam Updated Vital Signs BP (!) 133/98 (BP Location: Left Arm)   Pulse 72   Temp 98.4 F (36.9 C) (Oral)   Resp 18   Ht 5\' 10"  (1.778 m)   Wt 88 kg   LMP 10/31/2018   SpO2 100%   BMI 27.84 kg/m   Physical Exam Constitutional:      General: She is not in acute distress.    Appearance: Normal appearance. She is not ill-appearing, toxic-appearing or diaphoretic.  HENT:      Head: Normocephalic and atraumatic.  Cardiovascular:     Rate and Rhythm: Normal rate and regular rhythm.     Pulses: Normal pulses.  Pulmonary:  Effort: Pulmonary effort is normal.     Breath sounds: Normal breath sounds.  Musculoskeletal:        General: Normal range of motion.       Hands:     Comments: Patient with tenderness to hand as depicted above, with subjective numbness and tingling to thumb and first 2 digits.  No objective numbness.  No weakness to fingers, very slight weakness to thumb opposition when compared to the other side.  Normal cap refill.  Radial pulse 2+.  Positive Phalen's and Tinel's sign.  Skin:    General: Skin is warm and dry.     Capillary Refill: Capillary refill takes less than 2 seconds.  Neurological:     General: No focal deficit present.     Mental Status: She is alert and oriented to person, place, and time.  Psychiatric:        Mood and Affect: Mood normal.        Behavior: Behavior normal.        Thought Content: Thought content normal.     ED Results / Procedures / Treatments   Labs (all labs ordered are listed, but only abnormal results are displayed) Labs Reviewed - No data to display  EKG None  Radiology No results found.  Procedures Procedures (including critical care time)  Medications Ordered in ED Medications - No data to display  ED Course  I have reviewed the triage vital signs and the nursing notes.  Pertinent labs & imaging results that were available during my care of the patient were reviewed by me and considered in my medical decision making (see chart for details).    MDM Rules/Calculators/A&P                         Lorriane Dorough is a 44 y.o. female pertinent past medical history of hypertension, irritable bowel, anxiety that presents emergency department today for hand pain.  Physical exam and history suggestive of carpal tunnel, patient is distally neurovascularly intact.  No need for imaging at this time.   Patient to be discharged with symptomatic treatment, patient does have hand specialist appointment in 2 weeks that she is already scheduled.  Doubt need for further emergent work up at this time. I explained the diagnosis and have given explicit precautions to return to the ER including for any other new or worsening symptoms. The patient understands and accepts the medical plan as it's been dictated and I have answered their questions. Discharge instructions concerning home care and prescriptions have been given. The patient is STABLE and is discharged to home in good condition.   Final Clinical Impression(s) / ED Diagnoses Final diagnoses:  Carpal tunnel syndrome of right wrist    Rx / DC Orders ED Discharge Orders    None       Alfredia Client, PA-C 04/09/20 1150    Isla Pence, MD 04/09/20 1521

## 2020-09-20 ENCOUNTER — Telehealth: Payer: Self-pay | Admitting: Family

## 2020-09-20 NOTE — Telephone Encounter (Signed)
PTS cough is more frequent and is still very much there. Pt is taking montelukast, robitussin cough syrup at night to help her sleep better, and inhaler but nothing else.

## 2020-09-20 NOTE — Telephone Encounter (Signed)
PT states she went to urgent care for her cough, Dr advised her to add montelukast, pt states she did start the montelukast her cough is worse and more frequent. Pt asking for advice on what to do.

## 2020-09-20 NOTE — Telephone Encounter (Signed)
Please have her schedule a follow up appointment. Her last appointment was in October 2021. Thank you.

## 2020-09-21 ENCOUNTER — Encounter: Payer: Self-pay | Admitting: Family

## 2020-09-21 ENCOUNTER — Other Ambulatory Visit: Payer: Self-pay | Admitting: Family

## 2020-09-21 ENCOUNTER — Ambulatory Visit: Payer: No Typology Code available for payment source | Admitting: Family

## 2020-09-21 ENCOUNTER — Other Ambulatory Visit: Payer: Self-pay

## 2020-09-21 VITALS — HR 101 | Temp 98.0°F | Resp 20 | Ht 69.5 in | Wt 215.4 lb

## 2020-09-21 DIAGNOSIS — K219 Gastro-esophageal reflux disease without esophagitis: Secondary | ICD-10-CM

## 2020-09-21 DIAGNOSIS — J4530 Mild persistent asthma, uncomplicated: Secondary | ICD-10-CM

## 2020-09-21 DIAGNOSIS — J3089 Other allergic rhinitis: Secondary | ICD-10-CM

## 2020-09-21 DIAGNOSIS — L309 Dermatitis, unspecified: Secondary | ICD-10-CM

## 2020-09-21 DIAGNOSIS — R059 Cough, unspecified: Secondary | ICD-10-CM | POA: Diagnosis not present

## 2020-09-21 MED ORDER — MONTELUKAST SODIUM 10 MG PO TABS: 10.0000 mg | ORAL_TABLET | Freq: Every day | ORAL | 3 refills | Status: DC

## 2020-09-21 MED ORDER — FLOVENT HFA 110 MCG/ACT IN AERO: 2.0000 | INHALATION_SPRAY | Freq: Two times a day (BID) | RESPIRATORY_TRACT | 3 refills | Status: DC | PRN

## 2020-09-21 MED ORDER — AZELASTINE HCL 0.1 % NA SOLN: 1.0000 | Freq: Two times a day (BID) | NASAL | 5 refills | Status: DC | PRN

## 2020-09-21 MED ORDER — FLUTICASONE PROPIONATE 50 MCG/ACT NA SUSP: NASAL | 5 refills | Status: DC

## 2020-09-21 MED ORDER — ALBUTEROL SULFATE HFA 108 (90 BASE) MCG/ACT IN AERS: 2.0000 | INHALATION_SPRAY | RESPIRATORY_TRACT | 1 refills | Status: DC | PRN

## 2020-09-21 NOTE — Progress Notes (Signed)
[address] [place] [address] Dept: [phone]  FOLLOW UP NOTE  Patient ID: Colleen Baker, female    DOB: [birth year]  Age: 45 y.o. MRN: [id number] Date of Office Visit: 09/21/2020  Assessment  Chief Complaint: Cough (Severe coughing flare up going 3 week, slight headache and chest discomfort during coughing spells. Some sputum during coughs. Started singular and no signs of improvements.  HPI Colleen Baker is a 45 year old female who presents today for an acute visit of cough.  She was last seen on January 22, 2020 by Althea Charon, FNP for mild persistent asthma, perennial allergic rhinitis with a possible nonallergic component, and gastroesophageal reflux disease.  She reports that she began coughing approximately 2-1/2 to 3 weeks ago.  The cough is described as productive with clear sputum.  She also reports shortness of breath from coughing and pain in her chest from coughing.  She denies any wheezing or tightness in her chest.  She has not tried using her Flovent 110 mcg 2 puffs twice a day for asthma flares because she does not know where it is.  She has not seen her Flovent the past 2 months since she moved.  She will use albuterol and this does help the cough and the shortness of breath for a period of time.  She has been using her albuterol inhaler 2-3 times a day.  She went to the urgent care last week due to the cough and was started on montelukast 10 mg once a day.  She has not noticed a difference in her cough since this was added on.  She reports that she finished a prednisone pack a week ago and had a steroid injection week ago by her dermatologist for a dermatological issue.  While on the prednisone by mouth and steroid injection she did not notice any difference in the cough or the shortness of breath.  She denies any fever, chills, long distance travel, or surgeries.  Reflux is reported as controlled with famotidine 20 mg twice a day.  She reports that she continues to  follow-up with GI.  Perennial allergic rhinitis with a possible nonallergic component is reported as moderately controlled with montelukast 10 mg once a day.  She has not used azelastine or fluticasone nasal spray in a while and has not used saline spray/rinse in the past couple months.  She reports a little bit of clear rhinorrhea, not very frequent nasal congestion and a little bit of postnasal drip.  She reports that she feels like this could be part of the reason she is coughing.  She continues to receive Dupixent injections every 2 weeks that was ordered by her dermatologist for her eczema.  She reports that her last injection was this past Saturday.     Drug Allergies:  Allergies  Allergen Reactions  . Aciphex [Rabeprazole Sodium] Hives  . Lisinopril Swelling    Facial   . Iodinated Diagnostic Agents Hives and Itching    Contrast dye    Review of Systems: Review of Systems  Constitutional: Negative for chills and fever.  HENT:       Reports a little bit of clear rhinorrhea, not frequent nasal congestion, and a little bit of postnasal drip.  Eyes:       Denies itchy watery eyes  Respiratory: Positive for cough and shortness of breath. Negative for wheezing.        Reports productive cough with clear sputum, shortness of breath from coughing, and a pain in her  chest from coughing.  Denies wheezing and tightness in her chest  Cardiovascular: Negative for chest pain and palpitations.  Gastrointestinal: Negative for heartburn.       Reports reflux has been doing good with famotidine twice a day  Genitourinary: Negative for dysuria.  Skin: Negative for itching and rash.  Neurological: Negative for headaches.  Endo/Heme/Allergies: Positive for environmental allergies.    Physical Exam: Pulse (!) 101   Temp 98 F (36.7 C) (Temporal)   Resp 20   Ht 5' 9.5" (1.765 m)   Wt 215 lb 6.4 oz (97.7 kg)   LMP 10/31/2018   SpO2 99%   BMI 31.35 kg/m    Physical  Exam Constitutional:      Appearance: Normal appearance.  HENT:     Head: Normocephalic and atraumatic.     Comments: Pharynx: Small amount of white drainage noted.  Eyes normal, ears normal, nose: Bilateral lower turbinates mildly edematous and erythematous with no drainage noted    Right Ear: Tympanic membrane, ear canal and external ear normal.     Left Ear: Tympanic membrane, ear canal and external ear normal.     Mouth/Throat:     Mouth: Mucous membranes are moist.     Pharynx: Oropharynx is clear.  Eyes:     Conjunctiva/sclera: Conjunctivae normal.  Cardiovascular:     Rate and Rhythm: Regular rhythm.     Heart sounds: Normal heart sounds.  Pulmonary:     Effort: Pulmonary effort is normal.     Breath sounds: Normal breath sounds.     Comments: Lungs clear to auscultation Musculoskeletal:     Cervical back: Neck supple.  Skin:    General: Skin is warm.  Neurological:     Mental Status: She is alert and oriented to person, place, and time.  Psychiatric:        Mood and Affect: Mood normal.        Behavior: Behavior normal.        Thought Content: Thought content normal.        Judgment: Judgment normal.     Diagnostics: FVC 2.76 L, FEV1 2.33 L.  Predicted FVC 3.69 L, predicted FEV1 2.9.  Spirometry indicates possible mild restriction.  Post bronchodilator response shows FVC 2.83 L, FEV1 2.45 L.  There is a 5% change in FEV1.  Spirometry indicates possible mild restriction  Assessment and Plan: 1. Cough   2. Dermatitis   3. Perennial allergic rhinitis with a possible nonallergic component   4. Mild persistent asthma, unspecified whether complicated   5. Gastroesophageal reflux disease, unspecified whether esophagitis present     Meds ordered this encounter  Medications  . albuterol (PROAIR HFA) 108 (90 Base) MCG/ACT inhaler    Sig: Inhale 2 puffs into the lungs every 4 (four) hours as needed for wheezing or shortness of breath.    Dispense:  8 g    Refill:  1     Replacing proair respiclick  . azelastine (ASTELIN) 0.1 % nasal spray    Sig: Place 1-2 sprays into both nostrils 2 (two) times daily as needed.    Dispense:  30 mL    Refill:  5  . fluticasone (FLONASE) 50 MCG/ACT nasal spray    Sig: Use 2 sprays in each nostril once a day as needed for stuffy nose    Dispense:  16 g    Refill:  5  . fluticasone (FLOVENT HFA) 110 MCG/ACT inhaler    Sig: Inhale 2 puffs into  the lungs 2 (two) times daily as needed. Use during asthma flares.    Dispense:  12 g    Refill:  3  . montelukast (SINGULAIR) 10 MG tablet    Sig: Take 1 tablet (10 mg total) by mouth at bedtime.    Dispense:  30 tablet    Refill:  3    Patient Instructions  Mild persistent asthma For now and with asthma flares start Flovent 110 mcg 2 puffs twice a day with spacer until symptoms return to baseline May use albuterol 2 puffs every 4 hours as needed for cough, wheeze, tightness in chest, or shortness of breath albuterol 2 puffs 5 to 15 minutes prior to exercise  Reflux Continue famotidine 20 mg twice a day Continue to follow-up with GI  Perennial allergic rhinitis with a possible nonallergic component Continue avoidance measures Restart fluticasone nasal spray using 2 sprays each nostril once a day as needed for stuffiness Restart azelastine nasal spray using 1 to 2 sprays each nostril 1-2 times a day as needed for runny nose/drainage down throat Continue montelukast 10 mg once a day.  This will help both her allergies and her asthma. May use saline nasal spray or saline nasal rinse as needed for nasal symptoms.  Use this prior to any medicated nasal sprays  Atopic dermatitis Continue Dupixent injections every 2 weeks as per dermatology  Please let us know if this treatment plan is not working well for you. Schedule follow-up appointment in 2 months or sooner if needed   Return in about 2 months (around 11/21/2020), or if symptoms worsen or fail to improve.    Thank you  for the opportunity to care for this patient.  Please do not hesitate to contact me with questions.  Althea Charon, FNP [facility]

## 2020-09-21 NOTE — Patient Instructions (Addendum)
Mild persistent asthma For now and with asthma flares start Flovent 110 mcg 2 puffs twice a day with spacer until symptoms return to baseline May use albuterol 2 puffs every 4 hours as needed for cough, wheeze, tightness in chest, or shortness of breath albuterol 2 puffs 5 to 15 minutes prior to exercise  Reflux Continue famotidine 20 mg twice a day Continue to follow-up with GI  Perennial allergic rhinitis with a possible nonallergic component Continue avoidance measures Restart fluticasone nasal spray using 2 sprays each nostril once a day as needed for stuffiness Restart azelastine nasal spray using 1 to 2 sprays each nostril 1-2 times a day as needed for runny nose/drainage down throat Continue montelukast 10 mg once a day.  This will help both her allergies and her asthma. May use saline nasal spray or saline nasal rinse as needed for nasal symptoms.  Use this prior to any medicated nasal sprays  Atopic dermatitis Continue Dupixent injections every 2 weeks as per dermatology  Please let us know if this treatment plan is not working well for you. Schedule follow-up appointment in 2 months or sooner if needed

## 2020-09-21 NOTE — Telephone Encounter (Signed)
Spoke wit patient she is schedule today at 11a for in office visit.

## 2020-09-22 NOTE — Telephone Encounter (Signed)
Pts insurance does not cover flonase please advise to change to flunisolide

## 2020-09-22 NOTE — Telephone Encounter (Signed)
Please let the patient know that fluticasone is not covered and that we are changing her to flunisolide 2 sprays each nostril once a day as needed for stuffy nose. Quantity #1 with 3 refills.

## 2020-11-01 ENCOUNTER — Other Ambulatory Visit: Payer: Self-pay

## 2020-11-01 MED ORDER — FAMOTIDINE 20 MG PO TABS: 20.0000 mg | ORAL_TABLET | Freq: Two times a day (BID) | ORAL | 0 refills | Status: DC

## 2020-11-22 NOTE — Patient Instructions (Addendum)
Mild persistent asthma For  asthma flares start Flovent 110 mcg 2 puffs twice a day with spacer until symptoms return to baseline May use albuterol 2 puffs every 4 hours as needed for cough, wheeze, tightness in chest, or shortness of breath albuterol 2 puffs 5 to 15 minutes prior to exercise  Reflux Continue famotidine 20 mg once a day Continue to follow-up with GI  Perennial allergic rhinitis with a possible nonallergic component Continue avoidance measures Continue fluticasone nasal spray using 2 sprays each nostril once a day as needed for stuffiness Continue azelastine nasal spray using 1 to 2 sprays each nostril 1-2 times a day as needed for runny nose/drainage down throat Continue montelukast 10 mg once a day.  This will help both her allergies and her asthma. May use saline nasal spray or saline nasal rinse as needed for nasal symptoms.  Use this prior to any medicated nasal sprays  Atopic dermatitis Continue Dupixent injections every 2 weeks as per dermatology  Your blood pressure is elevated while at today's office visit.  Please schedule a follow-up appointment with your primary care physician to discuss.  Please let us know if this treatment plan is not working well for you. Schedule follow-up appointment in 5 months or sooner if needed

## 2020-11-23 ENCOUNTER — Ambulatory Visit: Payer: No Typology Code available for payment source | Admitting: Family

## 2020-11-23 ENCOUNTER — Other Ambulatory Visit: Payer: Self-pay

## 2020-11-23 ENCOUNTER — Encounter: Payer: Self-pay | Admitting: Family

## 2020-11-23 VITALS — BP 158/98 | HR 81 | Temp 97.9°F | Resp 19

## 2020-11-23 DIAGNOSIS — L309 Dermatitis, unspecified: Secondary | ICD-10-CM | POA: Diagnosis not present

## 2020-11-23 DIAGNOSIS — J4530 Mild persistent asthma, uncomplicated: Secondary | ICD-10-CM

## 2020-11-23 DIAGNOSIS — J3089 Other allergic rhinitis: Secondary | ICD-10-CM | POA: Diagnosis not present

## 2020-11-23 DIAGNOSIS — K219 Gastro-esophageal reflux disease without esophagitis: Secondary | ICD-10-CM

## 2020-11-23 NOTE — Progress Notes (Signed)
[address] [place] [address] Dept: [phone]  FOLLOW UP NOTE  Patient ID: Colleen Baker, female    DOB: 1976/03/05  Age: 45 y.o. MRN: [id number] Date of Office Visit: 11/23/2020  Assessment  Chief Complaint: No chief complaint on file.  HPI Colleen Baker is a 45 year old female who presents today for follow-up of cough, dermatitis, perennial allergic rhinitis with a a possible nonallergic component, mild persistent asthma, and gastroesophageal reflux disease.  She was last seen on September 21, 2020 by Althea Charon, FNP.  Since we last saw her she reports that her primary care physician increased her duloxetine for her anxiety.  Mild persistent asthma is reported as moderately controlled with albuterol as needed and Flovent 110 mcg 2 puffs twice a day with spacer for asthma flares.  She reports that her cough is much better since her last office visit.  She now only will cough or wheeze every once in a while.  She also reports shortness of breath at times and occasional shortness of breath or coughing at night, but then she reports this has not occurred lately.  She denies tightness in her chest.  She uses her albuterol inhaler approximately 3 times a month.  Since her last office visit she has not made any trips to the emergency room or urgent care due to breathing problems.  She has been on 1 steroid injection for her "eczema/itchy spots" from dermatology.  Reflux is reported as moderately controlled with famotidine 20 mg once a day.  She reports occasional reflux symptoms.  She continues to follow-up with GI.  Perennial allergic rhinitis with a possible nonallergic component is reported as controlled with fluticasone nasal spray as needed, azelastine nasal spray as needed, and montelukast 10 mg once a day.  She denies any rhinorrhea, nasal congestion, and postnasal drip.  She has not had any sinus infections since we last saw her.  Atopic dermatitis is reported as moderately  controlled with Dupixent injections every 2 weeks as per dermatology.  She reports that she has had 1 steroid injection shortly after she last saw [place] for her "eczema/itchy spots."  Drug Allergies:  Allergies  Allergen Reactions   Aciphex [Rabeprazole Sodium] Hives   Lisinopril Swelling    Facial    Iodinated Diagnostic Agents Hives and Itching    Contrast dye    Review of Systems: Review of Systems  Constitutional:  Negative for chills and fever.  HENT:         Denies rhinorrhea, nasal congestion, and postnasal drip  Eyes:        Denies itchy watery eyes  Respiratory:  Positive for cough, shortness of breath and wheezing.        Reports cough and wheeze every once in a while, and shortness of breath at times.  Denies tightness in chest  Cardiovascular:  Positive for palpitations. Negative for chest pain.       Reports palpitations /anxiety and that her primary care physician has increased the dose of her duloxetine and this is helped with the palpitations.  Gastrointestinal:        Reports a little bit of reflux at times.  Currently on famotidine 20 mg once a day  Genitourinary:  Negative for dysuria.  Skin:  Negative for itching and rash.  Neurological:  Negative for headaches.  Endo/Heme/Allergies:  Positive for environmental allergies.    Physical Exam: BP (!) 158/98   Pulse 81   Temp 97.9 F (36.6 C) (Temporal)  Resp 19   LMP 10/31/2018   SpO2 98%    Physical Exam Constitutional:      Appearance: Normal appearance.  HENT:     Head: Normocephalic and atraumatic.     Comments: Pharynx normal, eyes normal, ears: Unable to visualize left tympanic membrane due to cerumen.  Right ear normal, nose normal    Right Ear: Tympanic membrane, ear canal and external ear normal.     Left Ear: Ear canal and external ear normal.     Nose: Nose normal.     Mouth/Throat:     Mouth: Mucous membranes are moist.     Pharynx: Oropharynx is clear.  Eyes:     Conjunctiva/sclera:  Conjunctivae normal.  Cardiovascular:     Rate and Rhythm: Regular rhythm.     Heart sounds: Normal heart sounds.  Pulmonary:     Effort: Pulmonary effort is normal.     Breath sounds: Normal breath sounds.     Comments: Lungs clear to auscultation Musculoskeletal:     Cervical back: Neck supple.  Skin:    General: Skin is warm.  Neurological:     Mental Status: She is alert and oriented to person, place, and time.    Diagnostics: FVC 3.04 L, FEV1 2.47 L.  Predicted FVC 3.75 L, predicted FEV1 3.01 L.  Spirometry indicates normal respiratory function.  Assessment and Plan: 1. Mild persistent asthma, unspecified whether complicated   2. Perennial allergic rhinitis with a possible nonallergic component   3. Gastroesophageal reflux disease, unspecified whether esophagitis present   4. Dermatitis     No orders of the defined types were placed in this encounter.   Patient Instructions  Mild persistent asthma For  asthma flares start Flovent 110 mcg 2 puffs twice a day with spacer until symptoms return to baseline May use albuterol 2 puffs every 4 hours as needed for cough, wheeze, tightness in chest, or shortness of breath albuterol 2 puffs 5 to 15 minutes prior to exercise  Reflux Continue famotidine 20 mg once a day Continue to follow-up with GI  Perennial allergic rhinitis with a possible nonallergic component Continue avoidance measures Continue fluticasone nasal spray using 2 sprays each nostril once a day as needed for stuffiness Continue azelastine nasal spray using 1 to 2 sprays each nostril 1-2 times a day as needed for runny nose/drainage down throat Continue montelukast 10 mg once a day.  This will help both her allergies and her asthma. May use saline nasal spray or saline nasal rinse as needed for nasal symptoms.  Use this prior to any medicated nasal sprays  Atopic dermatitis Continue Dupixent injections every 2 weeks as per dermatology  Your blood pressure is  elevated while at today's office visit.  Please schedule a follow-up appointment with your primary care physician to discuss.  Please let us know if this treatment plan is not working well for you. Schedule follow-up appointment in 5 months or sooner if needed Return in about 5 months (around 04/25/2021), or if symptoms worsen or fail to improve.    Thank you for the opportunity to care for this patient.  Please do not hesitate to contact me with questions.  Althea Charon, FNP [facility]

## 2020-12-08 DIAGNOSIS — M2170 Unequal limb length (acquired), unspecified site: Secondary | ICD-10-CM | POA: Insufficient documentation

## 2021-01-30 ENCOUNTER — Other Ambulatory Visit: Payer: Self-pay | Admitting: Family

## 2021-01-30 NOTE — Telephone Encounter (Signed)
Ok to send prescription, BUT it needs to say take one tablet once a day. Quantity #30 tablets with 3 refills

## 2021-03-20 ENCOUNTER — Emergency Department (HOSPITAL_BASED_OUTPATIENT_CLINIC_OR_DEPARTMENT_OTHER): Payer: No Typology Code available for payment source

## 2021-03-20 ENCOUNTER — Emergency Department (HOSPITAL_BASED_OUTPATIENT_CLINIC_OR_DEPARTMENT_OTHER)
Admission: EM | Admit: 2021-03-20 | Discharge: 2021-03-20 | Disposition: A | Discharge status: Home / Self Care | Payer: No Typology Code available for payment source | Attending: Emergency Medicine | Admitting: Emergency Medicine

## 2021-03-20 ENCOUNTER — Other Ambulatory Visit: Payer: Self-pay

## 2021-03-20 ENCOUNTER — Encounter (HOSPITAL_BASED_OUTPATIENT_CLINIC_OR_DEPARTMENT_OTHER): Payer: Self-pay

## 2021-03-20 DIAGNOSIS — Z20822 Contact with and (suspected) exposure to covid-19: Secondary | ICD-10-CM | POA: Diagnosis not present

## 2021-03-20 DIAGNOSIS — Z79899 Other long term (current) drug therapy: Secondary | ICD-10-CM | POA: Insufficient documentation

## 2021-03-20 DIAGNOSIS — I10 Essential (primary) hypertension: Secondary | ICD-10-CM | POA: Insufficient documentation

## 2021-03-20 DIAGNOSIS — J069 Acute upper respiratory infection, unspecified: Secondary | ICD-10-CM | POA: Insufficient documentation

## 2021-03-20 DIAGNOSIS — J209 Acute bronchitis, unspecified: Secondary | ICD-10-CM | POA: Insufficient documentation

## 2021-03-20 DIAGNOSIS — Z7722 Contact with and (suspected) exposure to environmental tobacco smoke (acute) (chronic): Secondary | ICD-10-CM | POA: Diagnosis not present

## 2021-03-20 DIAGNOSIS — R059 Cough, unspecified: Secondary | ICD-10-CM | POA: Diagnosis present

## 2021-03-20 DIAGNOSIS — J45909 Unspecified asthma, uncomplicated: Secondary | ICD-10-CM | POA: Diagnosis not present

## 2021-03-20 LAB — RESP PANEL BY RT-PCR (FLU A&B, COVID) ARPGX2
Influenza A by PCR: NEGATIVE
Influenza B by PCR: NEGATIVE
SARS Coronavirus 2 by RT PCR: NEGATIVE

## 2021-03-20 MED ORDER — PREDNISONE 20 MG PO TABS: 60.0000 mg | ORAL_TABLET | Freq: Every day | ORAL | 0 refills | Status: AC

## 2021-03-20 MED ORDER — METHYLPREDNISOLONE SODIUM SUCC 125 MG IJ SOLR
125.0000 mg | Freq: Once | INTRAMUSCULAR | Status: AC
  Administered 2021-03-20: 10:00:00 125 mg via INTRAMUSCULAR
  Filled 2021-03-20: qty 2

## 2021-03-20 MED ORDER — ONDANSETRON 4 MG PO TBDP
4.0000 mg | ORAL_TABLET | Freq: Once | ORAL | Status: AC
  Administered 2021-03-20: 10:00:00 4 mg via ORAL
  Filled 2021-03-20: qty 1

## 2021-03-20 MED ORDER — ONDANSETRON HCL 4 MG PO TABS: 4.0000 mg | ORAL_TABLET | Freq: Four times a day (QID) | ORAL | 0 refills | Status: DC

## 2021-03-20 NOTE — ED Triage Notes (Signed)
Pt started with cough on 11/21. Son had flu at the time, so was prescribed prednisone, tamiflu. States the past week hasnt eaten much because she "gets choked on food" and cant keep it down. C/o nausea, cough, shortness of breath, chest pain.

## 2021-03-20 NOTE — Discharge Instructions (Signed)
Chest x-ray shows no evidence of pneumonia.  Take prednisone as prescribed.  Recommend using 2 puffs of your inhaler every 4 hours for the next 24 hours and then as needed.  Use Zofran as needed for nausea and vomiting.  Follow-up your COVID and viral testing on your MyChart.

## 2021-03-20 NOTE — ED Notes (Signed)
Portable Xray at bedside at this time.

## 2021-03-20 NOTE — ED Notes (Signed)
AVS with prescriptions provided to and discussed with patient. Pt verbalizes understanding of discharge instructions and denies any questions or concerns at this time. Pt ambulated out of department independently with steady gait. ? ?

## 2021-03-20 NOTE — ED Provider Notes (Signed)
[place] EMERGENCY DEPARTMENT Provider Note   CSN: [id number] Arrival date & time: 03/20/21  0910     History Chief Complaint  Patient presents with   Cough    Colleen Baker is a 45 y.o. female.  The history is provided by the patient.  Cough Cough characteristics:  Non-productive Sputum characteristics:  Nondescript Severity:  Mild Onset quality:  Gradual Duration:  3 weeks Timing:  Intermittent Progression:  Waxing and waning Chronicity:  New Context: sick contacts and upper respiratory infection   Relieved by:  Beta-agonist inhaler Worsened by:  Activity Associated symptoms: chest pain   Associated symptoms: no chills, no diaphoresis, no ear fullness, no ear pain, no eye discharge, no fever, no headaches, no myalgias, no rash, no rhinorrhea, no shortness of breath, no sinus congestion, no sore throat, no weight loss and no wheezing       Past Medical History:  Diagnosis Date   Anemia    allergy induced   Anxiety    Arthritis    Asthma with allergic rhinitis    Chronic back pain    Crohn's disease in remission ([place])    Depression    Eczema    GERD (gastroesophageal reflux disease)    Headache    Heart murmur    asymptomatic    History of abnormal cervical Pap smear    History of kidney stones    Hypertension    Irritable bowel    Pinched nerve in neck    Uterine fibroid     Patient Active Problem List   Diagnosis Date Noted   GERD (gastroesophageal reflux disease) 09/17/2019   Postoperative state 11/06/2018   Dermatitis 01/06/2016   Mild persistent asthma 01/06/2016   Perennial allergic rhinitis with a possible nonallergic component 01/06/2016   Atopic dermatitis 01/06/2016    Past Surgical History:  Procedure Laterality Date   ABDOMINAL HYSTERECTOMY     BUNIONECTOMY Left    CESAREAN SECTION  08/31/2011   Procedure: CESAREAN SECTION;  Surgeon: Farrel Gobble. Harrington Challenger, MD;  Location: [facility];  Service: Gynecology;  Laterality: N/A;    COLONOSCOPY  last one 2015   CYSTOSCOPY  11/06/2018   Procedure: CYSTOSCOPY;  Surgeon: Bobbye Charleston, MD;  Location: [facility];  Service: Gynecology;;   [place] N/A 01/13/2015   Procedure: [place];  Surgeon: Vanessa Kick, MD;  Location: [facility];  Service: Gynecology;  Laterality: N/A;   ganglion cyst removed Right    hiatal hernia repair  as child   LEEP  1990's   MENISCUS REPAIR Right 6 yrs ago   MYOMECTOMY ABDOMINAL APPROACH  02-15-2005   NORPLANT REMOVAL Left 11/06/2018   Procedure: REMOVAL OF NEXPLANON;  Surgeon: Bobbye Charleston, MD;  Location: [facility];  Service: Gynecology;  Laterality: Left;   ROBOT ASSISTED MYOMECTOMY  03/ 2012   ROBOTIC ASSISTED LAPAROSCOPIC HYSTERECTOMY AND SALPINGECTOMY Bilateral 11/06/2018   Procedure: XI ROBOTIC ASSISTED LAPAROSCOPIC HYSTERECTOMY WITH BILATERAL SALPINGECTOMY;  Surgeon: Bobbye Charleston, MD;  Location: [facility];  Service: Gynecology;  Laterality: Bilateral;     OB History     Gravida  2   Para  1   Term  1   Preterm  0   AB  1   Living  1      SAB  0   IAB  1   Ectopic  0   Multiple  0   Live Births  1           Family History  Problem Relation Age of Onset   Hypertension Mother    Asthma Mother    Bronchitis Mother    Kidney disease Father    Asthma Father    Eczema Sister    Sinusitis Sister    Asthma Maternal Grandmother    Eczema Maternal Grandmother    Bronchitis Maternal Grandmother    Allergic rhinitis Son    Eczema Son    Urticaria Neg Hx    Immunodeficiency Neg Hx    Angioedema Neg Hx     Social History   Tobacco Use   Smoking status: Passive Smoke Exposure - Never Smoker   Smokeless tobacco: Never  Vaping Use   Vaping Use: Never used  Substance Use Topics   Alcohol use: Yes    Comment: occasional   Drug use: No    Home  Medications Prior to Admission medications   Medication Sig Start Date End Date Taking? Authorizing Provider  ondansetron (ZOFRAN) 4 MG tablet Take 1 tablet (4 mg total) by mouth every 6 (six) hours. 03/20/21  Yes Rhylin Venters, DO  predniSONE (DELTASONE) 20 MG tablet Take 3 tablets (60 mg total) by mouth daily for 4 days. 03/20/21 03/24/21 Yes Sima Lindenberger, DO  albuterol (PROAIR HFA) 108 (90 Base) MCG/ACT inhaler Inhale 2 puffs into the lungs every 4 (four) hours as needed for wheezing or shortness of breath. 09/21/20   Althea Charon, FNP  alclomethasone (ACLOVATE) 0.05 % cream PLEASE SEE ATTACHED FOR DETAILED DIRECTIONS 01/06/20   [provider]  amLODipine (NORVASC) 10 MG tablet TAKE ONE-HALF TABLET BY MOUTH DAILY FOR 3 DAYS. INCREASE TO 1 TABLET BY MOUTH DAILY IF TOLERANT 12/06/18   [provider]  azelastine (ASTELIN) 0.1 % nasal spray Place 1-2 sprays into both nostrils 2 (two) times daily as needed. 09/21/20   Althea Charon, FNP  celecoxib (CELEBREX) 100 MG capsule Take 100 mg by mouth 2 (two) times daily as needed. 07/01/20   [provider]  clobetasol cream (TEMOVATE) 5.32 % Apply 1 application topically 2 (two) times daily. 08/27/20   [provider]  clonazePAM (KLONOPIN) 0.25 MG disintegrating tablet Take 0.25 mg by mouth 2 (two) times daily. 02/07/19   [provider]  DULoxetine (CYMBALTA) 60 MG capsule TAKE ONE CAPSULE BY MOUTH DAILY 03/17/19   [provider]  dupilumab (DUPIXENT) 200 MG/1.14ML prefilled syringe Inject 300 mg into the skin every 14 (fourteen) days. For excema    [provider]  escitalopram (LEXAPRO) 10 MG tablet Take 1/2 tablet daily for one week then take 1 tablet daily 02/14/19   [provider]  eszopiclone (LUNESTA) 2 MG TABS tablet Take 2 tablets  immediately before bedtime 11/27/19   [provider]  Eszopiclone 3 MG TABS Take 3 mg by mouth at bedtime. 10/05/19   [provider]  famotidine (PEPCID) 20 MG tablet Take 1 tablet (20 mg total) by mouth daily. 01/31/21   Althea Charon, FNP  flunisolide (NASALIDE) 25 MCG/ACT (0.025%) SOLN 2 sprays each nostril once a day as needed for stuffy nose. 09/22/20   Althea Charon, FNP  fluticasone (FLOVENT HFA) 110 MCG/ACT inhaler Inhale 2 puffs into the lungs 2 (two) times daily as needed. Use during asthma flares. 09/21/20   Althea Charon, FNP  hydrochlorothiazide (HYDRODIURIL) 25 MG tablet TAKE ONE TABLET BY MOUTH DAILY 12/06/18   [provider]  levocetirizine (XYZAL) 5 MG tablet Take  1 tablet (5 mg total) by mouth every evening. 01/06/16   Bobbitt, Sedalia Muta, MD  mesalamine (LIALDA) 1.2 g EC tablet Take 2.4 g by mouth at bedtime.     [provider]  metoprolol succinate (TOPROL-XL) 100 MG 24 hr tablet Take 100 mg by mouth at bedtime. Take with or immediately following a meal.    [provider]  montelukast (SINGULAIR) 10 MG tablet Take 1 tablet (10 mg total) by mouth at bedtime. 09/21/20   Althea Charon, FNP  potassium chloride (K-DUR) 10 MEQ tablet Take 10 mEq by mouth daily.    [provider]  rosuvastatin (CRESTOR) 10 MG tablet Take by mouth. 03/03/19   [provider]  SSD 1 % cream  01/21/19   [provider]  tacrolimus (PROTOPIC) 0.1 % ointment tacrolimus 0.1 % topical ointment    [provider]  triamcinolone cream (KENALOG) 0.1 % Use sparingly to affected ares twice daily as needed Patient taking differently: Apply 1 application topically 2 (two) times daily as needed (itching). 01/06/16   Bobbitt, Sedalia Muta, MD    Allergies    Aciphex Graciella Belton sodium], Lisinopril, and Iodinated diagnostic agents  Review of Systems   Review of Systems  Constitutional:  Negative for chills, diaphoresis, fever and weight loss.  HENT:  Negative for ear pain, rhinorrhea and sore throat.   Eyes:  Negative for pain, discharge and visual disturbance.   Respiratory:  Positive for cough. Negative for shortness of breath and wheezing.   Cardiovascular:  Positive for chest pain. Negative for palpitations.  Gastrointestinal:  Negative for abdominal pain and vomiting.  Genitourinary:  Negative for dysuria and hematuria.  Musculoskeletal:  Negative for arthralgias, back pain and myalgias.  Skin:  Negative for color change and rash.  Neurological:  Negative for seizures, syncope and headaches.  All other systems reviewed and are negative.  Physical Exam Updated Vital Signs BP (!) 155/105   Pulse 89   Temp 98.1 F (36.7 C) (Oral)   Resp (!) 23   Ht 5\' 10"  (1.778 m)   Wt 97.5 kg   LMP 10/31/2018   SpO2 99%   BMI 30.85 kg/m   Physical Exam Vitals and nursing note reviewed.  Constitutional:      General: She is not in acute distress.    Appearance: She is well-developed. She is not ill-appearing.  HENT:     Head: Normocephalic and atraumatic.     Nose: Nose normal.     Mouth/Throat:     Mouth: Mucous membranes are moist.  Eyes:     Extraocular Movements: Extraocular movements intact.     Conjunctiva/sclera: Conjunctivae normal.     Pupils: Pupils are equal, round, and reactive to light.  Cardiovascular:     Rate and Rhythm: Normal rate and regular rhythm.     Pulses: Normal pulses.     Heart sounds: Normal heart sounds. No murmur heard. Pulmonary:     Effort: Pulmonary effort is normal. No respiratory distress.     Breath sounds: Normal breath sounds.  Abdominal:     Palpations: Abdomen is soft.     Tenderness: There is no abdominal tenderness.  Musculoskeletal:        General: No swelling.     Cervical back: Neck supple.  Skin:    General: Skin is warm and dry.     Capillary Refill: Capillary refill takes less than 2 seconds.  Neurological:     General: No focal deficit present.  Mental Status: She is alert.  Psychiatric:        Mood and Affect: Mood normal.    ED Results / Procedures / Treatments   Labs (all  labs ordered are listed, but only abnormal results are displayed) Labs Reviewed  RESP PANEL BY RT-PCR (FLU A&B, COVID) [id number]    EKG EKG Interpretation  Date/Time:  Sunday March 20 2021 09:25:49 EST Ventricular Rate:  99 PR Interval:  148 QRS Duration: 75 QT Interval:  392 QTC Calculation: 504 R Axis:   4 Text Interpretation: Sinus rhythm Abnormal R-wave progression, early transition Confirmed by Ayanah Snader ([id number]) on 03/20/2021 9:27:58 AM  Radiology DG Chest Portable 1 View  Result Date: 03/20/2021 CLINICAL DATA:  Cough. EXAM: PORTABLE CHEST 1 VIEW COMPARISON:  09/22/2020 FINDINGS: 0910 hours The cardiopericardial silhouette is within normal limits for size. The lungs are clear without focal pneumonia, edema, pneumothorax or pleural effusion. The visualized bony structures of the thorax show no acute abnormality. Telemetry leads overlie the chest. IMPRESSION: No active disease. Electronically Signed   By: Eric  Mansell M.D.   On: 03/20/2021 09:52    Procedures Procedures   Medications Ordered in ED Medications  ondansetron (ZOFRAN-ODT) disintegrating tablet 4 mg (4 mg Oral Given 03/20/21 0930)  methylPREDNISolone sodium succinate (SOLU-MEDROL) 125 mg/2 mL injection 125 mg (125 mg Intramuscular Given 03/20/21 0947)    ED Course  I have reviewed the triage vital signs and the nursing notes.  Pertinent labs & imaging results that were available during my care of the patient were reviewed by me and considered in my medical decision making (see chart for details).    MDM Rules/Calculators/A&P                           Colleen Baker is here with cough.  History of reactive airway disease.  Exposed to the flu several weeks ago has been having cough but no fever.  Using inhaler without much relief.  Coughing so hard that her ribs hurt and that she throws up at times.  Vital signs reassuring.  Fairly clear breath sounds bilaterally.  Suspect bronchitis.  Will test for COVID and flu  and get a chest x-ray.  Will give Zofran, Solu-Medrol.  Anticipate steroids and Zofran for home as likely bronchitis.  Chest x-ray negative for pneumonia.  Overall suspect bronchitis.  We will discharge with steroids.  Zofran as needed for nausea.  Understands return precautions.  This chart was dictated using voice recognition software.  Despite best efforts to proofread,  errors can occur which can change the documentation meaning.    Final Clinical Impression(s) / ED Diagnoses Final diagnoses:  Viral URI with cough  Acute bronchitis, unspecified organism    Rx / DC Orders ED Discharge Orders          Ordered    predniSONE (DELTASONE) 20 MG tablet  Daily        03/20/21 0959    ondansetron (ZOFRAN) 4 MG tablet  Every 6 hours        12 /04/22 [place], Sophea Rackham, DO 03/20/21 1000

## 2021-03-27 ENCOUNTER — Encounter (HOSPITAL_BASED_OUTPATIENT_CLINIC_OR_DEPARTMENT_OTHER): Payer: Self-pay

## 2021-03-27 ENCOUNTER — Other Ambulatory Visit: Payer: Self-pay

## 2021-03-27 ENCOUNTER — Emergency Department (HOSPITAL_BASED_OUTPATIENT_CLINIC_OR_DEPARTMENT_OTHER)
Admission: EM | Admit: 2021-03-27 | Discharge: 2021-03-27 | Disposition: A | Discharge status: Home / Self Care | Payer: No Typology Code available for payment source | Attending: Emergency Medicine | Admitting: Emergency Medicine

## 2021-03-27 DIAGNOSIS — Z79899 Other long term (current) drug therapy: Secondary | ICD-10-CM | POA: Insufficient documentation

## 2021-03-27 DIAGNOSIS — Z7722 Contact with and (suspected) exposure to environmental tobacco smoke (acute) (chronic): Secondary | ICD-10-CM | POA: Insufficient documentation

## 2021-03-27 DIAGNOSIS — J209 Acute bronchitis, unspecified: Secondary | ICD-10-CM | POA: Insufficient documentation

## 2021-03-27 DIAGNOSIS — J4530 Mild persistent asthma, uncomplicated: Secondary | ICD-10-CM | POA: Insufficient documentation

## 2021-03-27 DIAGNOSIS — I10 Essential (primary) hypertension: Secondary | ICD-10-CM | POA: Diagnosis not present

## 2021-03-27 DIAGNOSIS — R059 Cough, unspecified: Secondary | ICD-10-CM | POA: Diagnosis present

## 2021-03-27 MED ORDER — DEXAMETHASONE 4 MG PO TABS
10.0000 mg | ORAL_TABLET | Freq: Once | ORAL | Status: AC
  Administered 2021-03-27: 10 mg via ORAL
  Filled 2021-03-27: qty 3

## 2021-03-27 MED ORDER — BENZONATATE 100 MG PO CAPS: 100.0000 mg | ORAL_CAPSULE | Freq: Three times a day (TID) | ORAL | 0 refills | Status: DC

## 2021-03-27 NOTE — Discharge Instructions (Signed)
Take tylenol 2 pills 4 times a day and motrin 4 pills 3 times a day.  Drink plenty of fluids.  Return for worsening shortness of breath, headache, confusion. Follow up with your family doctor.   

## 2021-03-27 NOTE — ED Notes (Signed)
ED Provider at bedside. 

## 2021-03-27 NOTE — ED Triage Notes (Signed)
Pt states cough, since thanksgiving, treated for bronchitis one week ago with steroids, zofran.  States coughing so much it is painful, utilizing inhalers as prescribed.  Denies fevers.

## 2021-03-27 NOTE — ED Notes (Signed)
Agree with  SJ, RT assessment

## 2021-03-27 NOTE — ED Provider Notes (Signed)
[place] EMERGENCY DEPARTMENT Provider Note   CSN: [id number] Arrival date & time: 03/27/21  0630     History Chief Complaint  Patient presents with   Cough    Colleen Baker is a 44 y.o. female.  45 yo F with a cc of cough, congestion.  Going on for about three weeks.  Patient seen now twice in the ED and by the PCP.  Having paroxysm of cough off and on.  Some hemoptysis usually after forceful coughing.  No ongoing fevers.  Finished burst dose steroids.  Was on zpack and tamiflu at the onset.   The history is provided by the patient.  Cough Associated symptoms: no chest pain, no chills, no fever, no headaches, no myalgias, no rhinorrhea, no shortness of breath and no wheezing   Illness Severity:  Moderate Onset quality:  Gradual Duration:  3 weeks Timing:  Constant Progression:  Worsening Chronicity:  New Associated symptoms: congestion and cough   Associated symptoms: no chest pain, no fever, no headaches, no myalgias, no nausea, no rhinorrhea, no shortness of breath, no vomiting and no wheezing       Past Medical History:  Diagnosis Date   Anemia    allergy induced   Anxiety    Arthritis    Asthma with allergic rhinitis    Chronic back pain    Crohn's disease in remission ([place])    Depression    Eczema    GERD (gastroesophageal reflux disease)    Headache    Heart murmur    asymptomatic    History of abnormal cervical Pap smear    History of kidney stones    Hypertension    Irritable bowel    Pinched nerve in neck    Uterine fibroid     Patient Active Problem List   Diagnosis Date Noted   GERD (gastroesophageal reflux disease) 09/17/2019   Postoperative state 11/06/2018   Dermatitis 01/06/2016   Mild persistent asthma 01/06/2016   Perennial allergic rhinitis with a possible nonallergic component 01/06/2016   Atopic dermatitis 01/06/2016    Past Surgical History:  Procedure Laterality Date   ABDOMINAL HYSTERECTOMY     BUNIONECTOMY Left     CESAREAN SECTION  08/31/2011   Procedure: CESAREAN SECTION;  Surgeon: Farrel Gobble. Harrington Challenger, MD;  Location: [facility];  Service: Gynecology;  Laterality: N/A;   COLONOSCOPY  last one 2015   CYSTOSCOPY  11/06/2018   Procedure: CYSTOSCOPY;  Surgeon: Bobbye Charleston, MD;  Location[facility]  Service: Gynecology;;   [place] N/A 01/13/2015   Procedure: [place];  Surgeon: Vanessa Kick, MD;  Location: [facility];  Service: Gynecology;  Laterality: N/A;   ganglion cyst removed Right    hiatal hernia repair  as child   LEEP  1990's   MENISCUS REPAIR Right 6 yrs ago   MYOMECTOMY ABDOMINAL APPROACH  02-15-2005   NORPLANT REMOVAL Left 11/06/2018   Procedure: REMOVAL OF NEXPLANON;  Surgeon: Bobbye Charleston, MD;  Location: [place];  Service: Gynecology;  Laterality: Left;   ROBOT ASSISTED MYOMECTOMY  03/ 2012   ROBOTIC ASSISTED LAPAROSCOPIC HYSTERECTOMY AND SALPINGECTOMY Bilateral 11/06/2018   Procedure: XI ROBOTIC ASSISTED LAPAROSCOPIC HYSTERECTOMY WITH BILATERAL SALPINGECTOMY;  Surgeon: Bobbye Charleston, MD;  Location: [facility];  Service: Gynecology;  Laterality: Bilateral;     OB History     Gravida  2   Para  1   Term  1   Preterm  0   AB  1   Living  1      SAB  0   IAB  1   Ectopic  0   Multiple  0   Live Births  1           Family History  Problem Relation Age of Onset   Hypertension Mother    Asthma Mother    Bronchitis Mother    Kidney disease Father    Asthma Father    Eczema Sister    Sinusitis Sister    Asthma Maternal Grandmother    Eczema Maternal Grandmother    Bronchitis Maternal Grandmother    Allergic rhinitis Son    Eczema Son    Urticaria Neg Hx    Immunodeficiency Neg Hx    Angioedema Neg Hx     Social History   Tobacco Use   Smoking status: Passive Smoke Exposure - Never Smoker   Smokeless  tobacco: Never  Vaping Use   Vaping Use: Never used  Substance Use Topics   Alcohol use: Yes    Comment: occasional   Drug use: No    Home Medications Prior to Admission medications   Medication Sig Start Date End Date Taking? Authorizing Provider  albuterol (PROAIR HFA) 108 (90 Base) MCG/ACT inhaler Inhale 2 puffs into the lungs every 4 (four) hours as needed for wheezing or shortness of breath. 09/21/20   Althea Charon, FNP  alclomethasone (ACLOVATE) 0.05 % cream PLEASE SEE ATTACHED FOR DETAILED DIRECTIONS 01/06/20   [provider]  amLODipine (NORVASC) 10 MG tablet TAKE ONE-HALF TABLET BY MOUTH DAILY FOR 3 DAYS. INCREASE TO 1 TABLET BY MOUTH DAILY IF TOLERANT 12/06/18   [provider]  azelastine (ASTELIN) 0.1 % nasal spray Place 1-2 sprays into both nostrils 2 (two) times daily as needed. 09/21/20   Althea Charon, FNP  celecoxib (CELEBREX) 100 MG capsule Take 100 mg by mouth 2 (two) times daily as needed. 07/01/20   [provider]  clobetasol cream (TEMOVATE) 2.95 % Apply 1 application topically 2 (two) times daily. 08/27/20   [provider]  clonazePAM (KLONOPIN) 0.25 MG disintegrating tablet Take 0.25 mg by mouth 2 (two) times daily. 02/07/19   [provider]  DULoxetine (CYMBALTA) 60 MG capsule TAKE ONE CAPSULE BY MOUTH DAILY 03/17/19   [provider]  dupilumab (DUPIXENT) 200 MG/1.14ML prefilled syringe Inject 300 mg into the skin every 14 (fourteen) days. For excema    [provider]  escitalopram (LEXAPRO) 10 MG tablet Take 1/2 tablet daily for one week then take 1 tablet daily 02/14/19   [provider]  eszopiclone (LUNESTA) 2 MG TABS tablet Take 2 tablets  immediately before bedtime 11/27/19   [provider]  Eszopiclone 3 MG TABS Take 3 mg by mouth at bedtime. 10/05/19   [provider]  famotidine (PEPCID) 20 MG tablet Take 1 tablet (20 mg total) by mouth daily. 01/31/21   Althea Charon, FNP  flunisolide (NASALIDE) 25 MCG/ACT (0.025%) SOLN 2 sprays each nostril once a day as needed for stuffy nose. 09/22/20   Althea Charon, FNP  fluticasone (FLOVENT HFA) 110 MCG/ACT inhaler Inhale 2 puffs into the lungs 2 (two) times daily as needed. Use during asthma flares. 09/21/20   Althea Charon, FNP  hydrochlorothiazide (HYDRODIURIL) 25 MG tablet TAKE ONE TABLET BY MOUTH DAILY 12/06/18   [provider]  levocetirizine (XYZAL) 5 MG tablet Take 1 tablet (5 mg  total) by mouth every evening. 01/06/16   Bobbitt, Sedalia Muta, MD  mesalamine (LIALDA) 1.2 g EC tablet Take 2.4 g by mouth at bedtime.     [provider]  metoprolol succinate (TOPROL-XL) 100 MG 24 hr tablet Take 100 mg by mouth at bedtime. Take with or immediately following a meal.    [provider]  montelukast (SINGULAIR) 10 MG tablet Take 1 tablet (10 mg total) by mouth at bedtime. 09/21/20   Althea Charon, FNP  ondansetron (ZOFRAN) 4 MG tablet Take 1 tablet (4 mg total) by mouth every 6 (six) hours. 03/20/21   [place], Adam, DO  potassium chloride (K-DUR) 10 MEQ tablet Take 10 mEq by mouth daily.    [provider]  rosuvastatin (CRESTOR) 10 MG tablet Take by mouth. 03/03/19   [provider]  SSD 1 % cream  01/21/19   [provider]  tacrolimus (PROTOPIC) 0.1 % ointment tacrolimus 0.1 % topical ointment    [provider]  triamcinolone cream (KENALOG) 0.1 % Use sparingly to affected ares twice daily as needed Patient taking differently: Apply 1 application topically 2 (two) times daily as needed (itching). 01/06/16   Bobbitt, Sedalia Muta, MD    Allergies    Aciphex Graciella [place]ton sodium], Lisinopril, and Iodinated diagnostic agents  Review of Systems   Review of Systems  Constitutional:  Negative for chills and fever.  HENT:  Positive for congestion. Negative for rhinorrhea.   Eyes:  Negative for redness and visual disturbance.  Respiratory:  Positive  for cough. Negative for shortness of breath and wheezing.   Cardiovascular:  Negative for chest pain and palpitations.  Gastrointestinal:  Negative for nausea and vomiting.  Genitourinary:  Negative for dysuria and urgency.  Musculoskeletal:  Negative for arthralgias and myalgias.  Skin:  Negative for pallor and wound.  Neurological:  Negative for dizziness and headaches.   Physical Exam Updated Vital Signs BP (!) 143/95 (BP Location: Right Arm)   Pulse 82   Temp 98.4 F (36.9 C) (Oral)   Resp 20   Ht 5\' 10"  (1.778 m)   Wt 99.8 kg   LMP 10/31/2018   SpO2 100%   BMI 31.57 kg/m   Physical Exam Vitals and nursing note reviewed.  Constitutional:      General: She is not in acute distress.    Appearance: She is well-developed. She is not diaphoretic.  HENT:     Head: Normocephalic and atraumatic.     Mouth/Throat:     Comments: Posterior oropharynx without erythema edema or obvious drainage.  Eyes:     Pupils: Pupils are equal, round, and reactive to light.  Cardiovascular:     Rate and Rhythm: Normal rate and regular rhythm.     Heart sounds: No murmur heard.   No friction rub. No gallop.  Pulmonary:     Effort: Pulmonary effort is normal.     Breath sounds: No wheezing or rales.  Abdominal:     General: There is no distension.     Palpations: Abdomen is soft.     Tenderness: There is no abdominal tenderness.  Musculoskeletal:        General: No tenderness.     Cervical back: Normal range of motion and neck supple.  Skin:    General: Skin is warm and dry.  Neurological:     Mental Status: She is alert and oriented to person, place, and time.  Psychiatric:        Behavior: Behavior normal.  ED Results / Procedures / Treatments   Labs (all labs ordered are listed, but only abnormal results are displayed) Labs Reviewed - No data to display  EKG None  Radiology No results found.  Procedures Procedures   Medications Ordered in ED Medications   dexamethasone (DECADRON) tablet 10 mg (10 mg Oral Given 03/27/21 1002)    ED Course  I have reviewed the triage vital signs and the nursing notes.  Pertinent labs & imaging results that were available during my care of the patient were reviewed by me and considered in my medical decision making (see chart for details).    MDM Rules/Calculators/A&P                           45 yo F with a cc of uri symptoms, going on for about 3 weeks now.  Patient with frequent persistent cough.  Already treated for possible pertussis.  I feel most consistent with bronchitis.  Clear lungs on my exam.  Will give a dose of decadron here.    10:10 AM:  I have discussed the diagnosis/risks/treatment options with the patient and believe the pt to be eligible for discharge home to follow-up with PCP. We also discussed returning to the ED immediately if new or worsening sx occur. We discussed the sx which are most concerning (e.g., sudden worsening pain, fever, inability to tolerate by mouth) that necessitate immediate return. Medications administered to the patient during their visit and any new prescriptions provided to the patient are listed below.  Medications given during this visit Medications  dexamethasone (DECADRON) tablet 10 mg (10 mg Oral Given 03/27/21 1002)     The patient appears reasonably screen and/or stabilized for discharge and I doubt any other medical condition or other [facility] requiring further screening, evaluation, or treatment in the ED at this time prior to discharge.   Final Clinical Impression(s) / ED Diagnoses Final diagnoses:  Acute bronchitis, unspecified organism    Rx / DC Orders ED Discharge Orders     None        Deno Etienne, DO 03/27/21 1010

## 2021-06-20 DIAGNOSIS — Z91041 Radiographic dye allergy status: Secondary | ICD-10-CM | POA: Insufficient documentation

## 2021-06-20 DIAGNOSIS — J302 Other seasonal allergic rhinitis: Secondary | ICD-10-CM | POA: Insufficient documentation

## 2021-06-20 DIAGNOSIS — F32A Depression, unspecified: Secondary | ICD-10-CM | POA: Insufficient documentation

## 2021-09-13 DIAGNOSIS — F4321 Adjustment disorder with depressed mood: Secondary | ICD-10-CM | POA: Insufficient documentation

## 2021-11-08 DIAGNOSIS — R600 Localized edema: Secondary | ICD-10-CM | POA: Insufficient documentation

## 2021-11-14 DIAGNOSIS — Z840 Family history of diseases of the skin and subcutaneous tissue: Secondary | ICD-10-CM | POA: Insufficient documentation

## 2022-01-27 DIAGNOSIS — M21619 Bunion of unspecified foot: Secondary | ICD-10-CM | POA: Insufficient documentation

## 2022-06-03 ENCOUNTER — Other Ambulatory Visit: Payer: Self-pay

## 2022-06-03 ENCOUNTER — Emergency Department (HOSPITAL_BASED_OUTPATIENT_CLINIC_OR_DEPARTMENT_OTHER)
Admission: EM | Admit: 2022-06-03 | Discharge: 2022-06-03 | Disposition: A | Discharge status: Home / Self Care | Payer: Medicaid Other | Attending: Emergency Medicine | Admitting: Emergency Medicine

## 2022-06-03 DIAGNOSIS — L509 Urticaria, unspecified: Secondary | ICD-10-CM | POA: Diagnosis present

## 2022-06-03 MED ORDER — DEXAMETHASONE 4 MG PO TABS
10.0000 mg | ORAL_TABLET | Freq: Once | ORAL | Status: AC
Start: 2022-06-03 — End: 2022-06-03
  Administered 2022-06-03: 10 mg via ORAL
  Filled 2022-06-03: qty 3

## 2022-06-03 NOTE — ED Triage Notes (Signed)
Patient here with multiple areas of whelps.  She states that her lips are swollen.  She states that it started early this morning.  She states she has some mild shortness of breath.  No chest pain.  She states that she had a reaction to Lisinopril 9 years ago, but this is different.  No tongue swelling.  She states that her throat feels scratchy.

## 2022-06-03 NOTE — ED Provider Notes (Signed)
[facility] Provider Note   CSN: [id number] Arrival date & time: 06/03/22  1958     History  Chief Complaint  Patient presents with   Allergic Reaction    Colleen Baker is a 47 y.o. female.  47 yo F with a diffuse itchy rash.  This has been occurring for the past couple days.  She said has been occurring to her hands and feet and her legs and her back and her chest.  She also feels like her lips are very swollen.  She had had angioedema in the past and thought this was different.  She denies fevers denies sore throat denies cough or congestion.  She recently finished a steroid taper for nasal congestion, also tells me that she is on every day antihistamines.   Allergic Reaction      Home Medications Prior to Admission medications   Medication Sig Start Date End Date Taking? Authorizing Provider  albuterol (PROAIR HFA) 108 (90 Base) MCG/ACT inhaler Inhale 2 puffs into the lungs every 4 (four) hours as needed for wheezing or shortness of breath. 09/21/20   Althea Charon, FNP  alclomethasone (ACLOVATE) 0.05 % cream PLEASE SEE ATTACHED FOR DETAILED DIRECTIONS 01/06/20   [provider]  amLODipine (NORVASC) 10 MG tablet TAKE ONE-HALF TABLET BY MOUTH DAILY FOR 3 DAYS. INCREASE TO 1 TABLET BY MOUTH DAILY IF TOLERANT 12/06/18   [provider]  azelastine (ASTELIN) 0.1 % nasal spray Place 1-2 sprays into both nostrils 2 (two) times daily as needed. 09/21/20   Althea Charon, FNP  benzonatate (TESSALON) 100 MG capsule Take 1 capsule (100 mg total) by mouth every 8 (eight) hours. 03/27/21   Deno Etienne, DO  celecoxib (CELEBREX) 100 MG capsule Take 100 mg by mouth 2 (two) times daily as needed. 07/01/20   [provider]  clobetasol cream (TEMOVATE) [id number] % Apply 1 application topically 2 (two) times daily. 08/27/20   [provider]  clonazePAM (KLONOPIN) 0.25 MG disintegrating tablet Take 0.25 mg by mouth 2 (two)  times daily. 02/07/19   [provider]  DULoxetine (CYMBALTA) 60 MG capsule TAKE ONE CAPSULE BY MOUTH DAILY 03/17/19   [provider]  dupilumab (DUPIXENT) 200 MG/1.14ML prefilled syringe Inject 300 mg into the skin every 14 (fourteen) days. For excema    [provider]  escitalopram (LEXAPRO) 10 MG tablet Take 1/2 tablet daily for one week then take 1 tablet daily 02/14/19   [provider]  eszopiclone (LUNESTA) 2 MG TABS tablet Take 2 tablets  immediately before bedtime 11/27/19   [provider]  Eszopiclone 3 MG TABS Take 3 mg by mouth at bedtime. 10/05/19   [provider]  famotidine (PEPCID) 20 MG tablet Take 1 tablet (20 mg total) by mouth daily. 01/31/21   Althea Charon, FNP  flunisolide (NASALIDE) 25 MCG/ACT (0.025%) SOLN 2 sprays each nostril once a day as needed for stuffy nose. 09/22/20   Althea Charon, FNP  fluticasone (FLOVENT HFA) 110 MCG/ACT inhaler Inhale 2 puffs into the lungs 2 (two) times daily as needed. Use during asthma flares. 09/21/20   Althea Charon, FNP  hydrochlorothiazide (HYDRODIURIL) 25 MG tablet TAKE ONE TABLET BY MOUTH DAILY 12/06/18   [provider]  levocetirizine (XYZAL) 5 MG tablet Take 1 tablet (5 mg total) by mouth every evening. 01/06/16   Bobbitt, Sedalia Muta, MD  mesalamine (LIALDA) 1.2 g EC tablet Take 2.4 g by mouth at bedtime.  [provider]  metoprolol succinate (TOPROL-XL) 100 MG 24 hr tablet Take 100 mg by mouth at bedtime. Take with or immediately following a meal.    [provider]  montelukast (SINGULAIR) 10 MG tablet Take 1 tablet (10 mg total) by mouth at bedtime. 09/21/20   Althea Charon, FNP  ondansetron (ZOFRAN) 4 MG tablet Take 1 tablet (4 mg total) by mouth every 6 (six) hours. 03/20/21   Curatolo, Adam, DO  potassium chloride (K-DUR) 10 MEQ tablet Take 10 mEq by mouth daily.    [provider]  rosuvastatin (CRESTOR) 10 MG tablet Take by mouth.  03/03/19   [provider]  SSD 1 % cream  01/21/19   [provider]  tacrolimus (PROTOPIC) 0.1 % ointment tacrolimus 0.1 % topical ointment    [provider]  triamcinolone cream (KENALOG) 0.1 % Use sparingly to affected ares twice daily as needed Patient taking differently: Apply 1 application topically 2 (two) times daily as needed (itching). 01/06/16   Bobbitt, Sedalia Muta, MD      Allergies    Aciphex Graciella Belton sodium], Lisinopril, and Iodinated contrast media    Review of Systems   Review of Systems  Physical Exam Updated Vital Signs BP (!) 206/124 (BP Location: Left Arm)   Pulse 80   Temp 98.2 F (36.8 C) (Oral)   Resp 18   LMP 10/31/2018   SpO2 100%  Physical Exam Vitals and nursing note reviewed.  Constitutional:      General: She is not in acute distress.    Appearance: She is well-developed. She is not diaphoretic.  HENT:     Head: Normocephalic and atraumatic.     Mouth/Throat:     Comments: Bilateral lip swelling.  No obvious intraoral pathology. Eyes:     Pupils: Pupils are equal, round, and reactive to light.  Cardiovascular:     Rate and Rhythm: Normal rate and regular rhythm.     Heart sounds: No murmur heard.    No friction rub. No gallop.  Pulmonary:     Effort: Pulmonary effort is normal.     Breath sounds: No wheezing or rales.  Abdominal:     General: There is no distension.     Palpations: Abdomen is soft.     Tenderness: There is no abdominal tenderness.  Musculoskeletal:        General: No tenderness.     Cervical back: Normal range of motion and neck supple.  Skin:    General: Skin is warm and dry.     Comments: Diffuse papular rash.  Not obviously on the palms and soles but patient feels symptomatic there.  Neurological:     Mental Status: She is alert and oriented to person, place, and time.  Psychiatric:        Behavior: Behavior normal.     ED Results / Procedures / Treatments   Labs (all labs  ordered are listed, but only abnormal results are displayed) Labs Reviewed - No data to display  EKG None  Radiology No results found.  Procedures Procedures    Medications Ordered in ED Medications  dexamethasone (DECADRON) tablet 10 mg (has no administration in time range)    ED Course/ Medical Decision Making/ A&P                             Medical Decision Making Risk Prescription drug management.   47 yo F with a  chief complaint of a diffuse rash.  Been going on for about 48 hours.  This is like the patient is already seen in urgent care and as well as been on a course of steroids and takes antihistamines for this.  She denies any obvious new inciting factor.  She has had no fevers or other signs of upper respiratory illness.  She does tell me that its its involves the palms and soles but since I see no obvious lesions there clinically.  No obvious lesions of the mucous membranes.  She is otherwise well-appearing nontoxic.  She was just on steroids cautious about starting her on another course, will give her a single dose of Decadron here.  Encouraged her to follow-up with her family doctor in the office.  8:52 PM:  I have discussed the diagnosis/risks/treatment options with the patient.  Evaluation and diagnostic testing in the emergency department does not suggest an emergent condition requiring admission or immediate intervention beyond what has been performed at this time.  They will follow up with PCP. We also discussed returning to the ED immediately if new or worsening sx occur. We discussed the sx which are most concerning (e.g., sudden worsening pain, fever, inability to tolerate by mouth) that necessitate immediate return. Medications administered to the patient during their visit and any new prescriptions provided to the patient are listed below.  Medications given during this visit Medications  dexamethasone (DECADRON) tablet 10 mg (has no administration in time  range)     The patient appears reasonably screen and/or stabilized for discharge and I doubt any other medical condition or other [facility] requiring further screening, evaluation, or treatment in the ED at this time prior to discharge.          Final Clinical Impression(s) / ED Diagnoses Final diagnoses:  Hives    Rx / DC Orders ED Discharge Orders     None         Deno Etienne, DO 06/03/22 2052

## 2022-06-03 NOTE — Discharge Instructions (Signed)
Please follow-up with your family doctor in the office.  Please return for worsening symptoms difficulty breathing swallowing vomiting diarrhea or feeling that he might pass out.

## 2022-06-28 ENCOUNTER — Encounter (HOSPITAL_BASED_OUTPATIENT_CLINIC_OR_DEPARTMENT_OTHER): Payer: Self-pay | Admitting: Emergency Medicine

## 2022-06-28 ENCOUNTER — Emergency Department (HOSPITAL_BASED_OUTPATIENT_CLINIC_OR_DEPARTMENT_OTHER): Payer: PRIVATE HEALTH INSURANCE

## 2022-06-28 ENCOUNTER — Emergency Department (HOSPITAL_BASED_OUTPATIENT_CLINIC_OR_DEPARTMENT_OTHER)
Admission: EM | Admit: 2022-06-28 | Discharge: 2022-06-28 | Disposition: A | Discharge status: Home / Self Care | Payer: PRIVATE HEALTH INSURANCE | Attending: Emergency Medicine | Admitting: Emergency Medicine

## 2022-06-28 ENCOUNTER — Other Ambulatory Visit: Payer: Self-pay

## 2022-06-28 DIAGNOSIS — E876 Hypokalemia: Secondary | ICD-10-CM | POA: Diagnosis not present

## 2022-06-28 DIAGNOSIS — S29012A Strain of muscle and tendon of back wall of thorax, initial encounter: Secondary | ICD-10-CM

## 2022-06-28 DIAGNOSIS — J40 Bronchitis, not specified as acute or chronic: Secondary | ICD-10-CM

## 2022-06-28 DIAGNOSIS — Z7951 Long term (current) use of inhaled steroids: Secondary | ICD-10-CM | POA: Insufficient documentation

## 2022-06-28 DIAGNOSIS — X58XXXA Exposure to other specified factors, initial encounter: Secondary | ICD-10-CM | POA: Diagnosis not present

## 2022-06-28 DIAGNOSIS — Z20822 Contact with and (suspected) exposure to covid-19: Secondary | ICD-10-CM | POA: Insufficient documentation

## 2022-06-28 DIAGNOSIS — S3992XA Unspecified injury of lower back, initial encounter: Secondary | ICD-10-CM | POA: Diagnosis present

## 2022-06-28 DIAGNOSIS — R0789 Other chest pain: Secondary | ICD-10-CM

## 2022-06-28 LAB — CBC
HCT: 37.2 % (ref 36.0–46.0)
Hemoglobin: 12 g/dL (ref 12.0–15.0)
MCH: 26.3 pg (ref 26.0–34.0)
MCHC: 32.3 g/dL (ref 30.0–36.0)
MCV: 81.6 fL (ref 80.0–100.0)
Platelets: 237 10*3/uL (ref 150–400)
RBC: 4.56 MIL/uL (ref 3.87–5.11)
RDW: 14.1 % (ref 11.5–15.5)
WBC: 9 10*3/uL (ref 4.0–10.5)
nRBC: 0 % (ref 0.0–0.2)

## 2022-06-28 LAB — BASIC METABOLIC PANEL
Anion gap: 6 (ref 5–15)
BUN: 11 mg/dL (ref 6–20)
CO2: 29 mmol/L (ref 22–32)
Calcium: 8.4 mg/dL — ABNORMAL LOW (ref 8.9–10.3)
Chloride: 101 mmol/L (ref 98–111)
Creatinine, Ser: 1.11 mg/dL — ABNORMAL HIGH (ref 0.44–1.00)
GFR, Estimated: >60 mL/min (ref >60)
Glucose, Bld: 99 mg/dL (ref 70–99)
Potassium: 2.8 mmol/L — ABNORMAL LOW (ref 3.5–5.1)
Sodium: 136 mmol/L (ref 135–145)

## 2022-06-28 LAB — RESP PANEL BY RT-PCR (RSV, FLU A&B, COVID)  RVPGX2
Influenza A by PCR: NEGATIVE
Influenza B by PCR: NEGATIVE
Resp Syncytial Virus by PCR: NEGATIVE
SARS Coronavirus 2 by RT PCR: NEGATIVE

## 2022-06-28 LAB — GROUP A STREP BY PCR: Group A Strep by PCR: NOT DETECTED

## 2022-06-28 LAB — TROPONIN I (HIGH SENSITIVITY): Troponin I (High Sensitivity): 4 ng/L (ref <18)

## 2022-06-28 MED ORDER — ALBUTEROL SULFATE HFA 108 (90 BASE) MCG/ACT IN AERS
2.0000 | INHALATION_SPRAY | Freq: Once | RESPIRATORY_TRACT | Status: AC
  Administered 2022-06-28: 2 via RESPIRATORY_TRACT
  Filled 2022-06-28: qty 6.7

## 2022-06-28 MED ORDER — POTASSIUM CHLORIDE CRYS ER 20 MEQ PO TBCR
40.0000 meq | EXTENDED_RELEASE_TABLET | Freq: Once | ORAL | Status: AC
  Administered 2022-06-28: 40 meq via ORAL
  Filled 2022-06-28: qty 2

## 2022-06-28 MED ORDER — DEXAMETHASONE SODIUM PHOSPHATE 10 MG/ML IJ SOLN
10.0000 mg | Freq: Once | INTRAMUSCULAR | Status: AC
  Administered 2022-06-28: 10 mg via INTRAVENOUS
  Filled 2022-06-28: qty 1

## 2022-06-28 MED ORDER — POTASSIUM CHLORIDE CRYS ER 20 MEQ PO TBCR: 40.0000 meq | EXTENDED_RELEASE_TABLET | Freq: Every day | ORAL | 0 refills | Status: DC

## 2022-06-28 MED ORDER — POTASSIUM CHLORIDE CRYS ER 20 MEQ PO TBCR: 10.0000 meq | EXTENDED_RELEASE_TABLET | Freq: Every day | ORAL | 0 refills | Status: DC

## 2022-06-28 MED ORDER — CYCLOBENZAPRINE HCL 10 MG PO TABS: 10.0000 mg | ORAL_TABLET | Freq: Three times a day (TID) | ORAL | 0 refills | Status: AC | PRN

## 2022-06-28 NOTE — ED Triage Notes (Addendum)
Pt w/ cough, SHOB x 1 wk; hx asthma; also c/o sore throat

## 2022-06-28 NOTE — Discharge Instructions (Signed)
Thank you for letting us take care of you today.  Overall, your workup was reassuring. Your lungs sounded good and we do not see any damage to your heart. You did have a low potassium level.  We gave you a dose of potassium in the ED today to help replete this.  I am prescribing potassium for you to take at home. Please take '40mg'$  for the next 5 days and following that take '10mg'$  daily. I am also prescribing muscle relaxers to help any muscle strain you may be experiencing from your recent symptoms.   If you have any underlying bronchitis or asthma exacerbation, the dose of Decadron given today should help with this. Continue to use the inhaler provided as needed to help with your asthma. Keep your scheduled follow up appointment with primary care. Should you need to be seen sooner, I have also provided information for 2 other primary care clinics you may contact to attempt to schedule an appointment if you would like.  If you develop any new or worsening symptoms such as those we discussed, please return to nearest emergency department for re-evaluation.

## 2022-06-28 NOTE — ED Provider Notes (Signed)
[name] Provider Note   CSN: [id number] Arrival date & time: 06/28/22  1330     History  Chief Complaint  Patient presents with   Shortness of Breath    Colleen Baker is a 47 y.o. female who presents to the ED c/o cough and feeling like her asthma is uncontrolled for the last week. She notes increased use of her rescue inhaler which she is using every 4-6 hours. She typically uses it about once a week. 2 days ago she started having pain across her upper back worse on the left and left anterior chest. This pain is worse with coughing. She c/o a sore throat that started last night as well as left sided parietal headache, bilateral ear pain, R>L. She denies radiating chest pain, diaphoresis, lightheadedness, dizziness, syncope, nausea, vomiting. She denies known sick contacts. She denies fever, chills, nausea, vomiting, abdominal pain, or other complaints today. Family history positive for early CAD in the 52s. No personal history of CAD/MI.     Home Medications Prior to Admission medications   Medication Sig Start Date End Date Taking? Authorizing Provider  cyclobenzaprine (FLEXERIL) 10 MG tablet Take 1 tablet (10 mg total) by mouth 3 (three) times daily as needed for up to 5 days for muscle spasms. 06/28/22 07/03/22 Yes Aneisa Karren L, PA-C  potassium chloride SA (KLOR-CON M) 20 MEQ tablet Take 2 tablets (40 mEq total) by mouth daily for 5 days. 06/29/22 07/04/22 Yes Emanuelle Bastos L, PA-C  potassium chloride SA (KLOR-CON M) 20 MEQ tablet Take 0.5 tablets (10 mEq total) by mouth daily. 07/04/22 08/03/22 Yes Neville Walston L, PA-C  albuterol (PROAIR HFA) 108 (90 Base) MCG/ACT inhaler Inhale 2 puffs into the lungs every 4 (four) hours as needed for wheezing or shortness of breath. 09/21/20   Althea Charon, FNP  alclomethasone (ACLOVATE) 0.05 % cream PLEASE SEE ATTACHED FOR DETAILED DIRECTIONS 01/06/20   [provider]  amLODipine (NORVASC)  10 MG tablet TAKE ONE-HALF TABLET BY MOUTH DAILY FOR 3 DAYS. INCREASE TO 1 TABLET BY MOUTH DAILY IF TOLERANT 12/06/18   [provider]  azelastine (ASTELIN) 0.1 % nasal spray Place 1-2 sprays into both nostrils 2 (two) times daily as needed. 09/21/20   Althea Charon, FNP  benzonatate (TESSALON) 100 MG capsule Take 1 capsule (100 mg total) by mouth every 8 (eight) hours. 03/27/21   Deno Etienne, DO  celecoxib (CELEBREX) 100 MG capsule Take 100 mg by mouth 2 (two) times daily as needed. 07/01/20   [provider]  clobetasol cream (TEMOVATE) [id number] % Apply 1 application topically 2 (two) times daily. 08/27/20   [provider]  clonazePAM (KLONOPIN) 0.25 MG disintegrating tablet Take 0.25 mg by mouth 2 (two) times daily. 02/07/19   [provider]  DULoxetine (CYMBALTA) 60 MG capsule TAKE ONE CAPSULE BY MOUTH DAILY 03/17/19   [provider]  dupilumab (DUPIXENT) 200 MG/1.14ML prefilled syringe Inject 300 mg into the skin every 14 (fourteen) days. For excema    [provider]  escitalopram (LEXAPRO) 10 MG tablet Take 1/2 tablet daily for one week then take 1 tablet daily 02/14/19   [provider]  eszopiclone (LUNESTA) 2 MG TABS tablet Take 2 tablets  immediately before bedtime 11/27/19   [provider]  Eszopiclone 3 MG TABS Take 3 mg by mouth at bedtime. 10/05/19   [provider]  famotidine (PEPCID) 20 MG tablet Take 1 tablet (20 mg total) by mouth  daily. 01/31/21   Althea Charon, FNP  flunisolide (NASALIDE) 25 MCG/ACT (0.025%) SOLN 2 sprays each nostril once a day as needed for stuffy nose. 09/22/20   Althea Charon, FNP  fluticasone (FLOVENT HFA) 110 MCG/ACT inhaler Inhale 2 puffs into the lungs 2 (two) times daily as needed. Use during asthma flares. 09/21/20   Althea Charon, FNP  hydrochlorothiazide (HYDRODIURIL) 25 MG tablet TAKE ONE TABLET BY MOUTH DAILY 12/06/18   [provider]  levocetirizine (XYZAL) 5 MG  tablet Take 1 tablet (5 mg total) by mouth every evening. 01/06/16   Bobbitt, Sedalia Muta, MD  mesalamine (LIALDA) 1.2 g EC tablet Take 2.4 g by mouth at bedtime.     [provider]  metoprolol succinate (TOPROL-XL) 100 MG 24 hr tablet Take 100 mg by mouth at bedtime. Take with or immediately following a meal.    [provider]  montelukast (SINGULAIR) 10 MG tablet Take 1 tablet (10 mg total) by mouth at bedtime. 09/21/20   Althea Charon, FNP  ondansetron (ZOFRAN) 4 MG tablet Take 1 tablet (4 mg total) by mouth every 6 (six) hours. 03/20/21   Curatolo, Adam, DO  potassium chloride (K-DUR) 10 MEQ tablet Take 10 mEq by mouth daily.    [provider]  rosuvastatin (CRESTOR) 10 MG tablet Take by mouth. 03/03/19   [provider]  SSD 1 % cream  01/21/19   [provider]  tacrolimus (PROTOPIC) 0.1 % ointment tacrolimus 0.1 % topical ointment    [provider]  triamcinolone cream (KENALOG) 0.1 % Use sparingly to affected ares twice daily as needed Patient taking differently: Apply 1 application topically 2 (two) times daily as needed (itching). 01/06/16   Bobbitt, Sedalia Muta, MD      Allergies    Aciphex [rabeprazole sodium], Lisinopril, and Iodinated contrast media    Review of Systems   Review of Systems  All other systems reviewed and are negative.   Physical Exam Updated Vital Signs BP (!) 173/131   Pulse 67   Temp 98.2 F (36.8 C) (Oral)   Resp (!) 22   Ht 5\' 10"  (1.778 m)   Wt 90.3 kg   LMP 10/31/2018   SpO2 98%   BMI 28.55 kg/m  Physical Exam Vitals and nursing note reviewed.  Constitutional:      General: She is not in acute distress.    Appearance: Normal appearance. She is not ill-appearing, toxic-appearing or diaphoretic.  HENT:     Head: Normocephalic and atraumatic.     Right Ear: Tympanic membrane, ear canal and external ear normal.     Left Ear: Tympanic membrane, ear canal and external ear normal.      Ears:     Comments: Portions of TM able to be visualized non-bulging, non-erythematous though there is significant cerumen bilaterally    Nose:     Right Sinus: No maxillary sinus tenderness or frontal sinus tenderness.     Left Sinus: No maxillary sinus tenderness or frontal sinus tenderness.     Mouth/Throat:     Mouth: Mucous membranes are moist.     Pharynx: Oropharynx is clear. Uvula midline. No pharyngeal swelling, oropharyngeal exudate, posterior oropharyngeal erythema or uvula swelling.     Tonsils: No tonsillar exudate or tonsillar abscesses.  Eyes:     Extraocular Movements: Extraocular movements intact.     Conjunctiva/sclera: Conjunctivae normal.     Pupils: Pupils are equal, round, and reactive to light.  Neck:  Vascular: No JVD.     Comments: No midline tenderness, stepoffs, or deformities Cardiovascular:     Rate and Rhythm: Normal rate and regular rhythm.     Heart sounds: No murmur heard. Pulmonary:     Effort: Pulmonary effort is normal. No respiratory distress.     Breath sounds: Normal breath sounds. No stridor. No decreased breath sounds, wheezing, rhonchi or rales.  Chest:     Chest wall: Tenderness (diffuse L anterior, reproduces complaint) present.  Abdominal:     General: Abdomen is flat.     Palpations: Abdomen is soft.     Tenderness: There is no abdominal tenderness. There is no guarding or rebound.  Musculoskeletal:        General: Normal range of motion.     Cervical back: Normal range of motion and neck supple.     Right lower leg: No tenderness. No edema.     Left lower leg: No tenderness. No edema.     Comments: No midline thoracic or lumbar spinal tenderness, stepoffs, or deformities; tenderness over L trapezius muscle that reproduces complaint  Skin:    General: Skin is warm and dry.     Capillary Refill: Capillary refill takes less than 2 seconds.  Neurological:     General: No focal deficit present.     Mental Status: She is alert and  oriented to person, place, and time. Mental status is at baseline.     GCS: GCS eye subscore is 4. GCS verbal subscore is 5. GCS motor subscore is 6.     Cranial Nerves: Cranial nerves 2-12 are intact. No dysarthria or facial asymmetry.     Motor: Motor function is intact. No weakness, tremor, atrophy, abnormal muscle tone or seizure activity.     Coordination: Coordination is intact.  Psychiatric:        Mood and Affect: Mood is anxious.        Speech: Speech normal.        Behavior: Behavior is cooperative.        Thought Content: Thought content normal.        Judgment: Judgment normal.     ED Results / Procedures / Treatments   Labs (all labs ordered are listed, but only abnormal results are displayed) Labs Reviewed  BASIC METABOLIC PANEL - Abnormal; Notable for the following components:      Result Value   Potassium 2.8 (*)    Creatinine, Ser 1.11 (*)    Calcium 8.4 (*)    All other components within normal limits  RESP PANEL BY RT-PCR (RSV, FLU A&B, COVID)  RVPGX2  GROUP A STREP BY PCR  CBC  TROPONIN I (HIGH SENSITIVITY)    EKG NSR 65 bpm, no acute ST-T changes, no significant abnormalities, no STEMI  Radiology DG Chest 2 View  Result Date: 06/28/2022 CLINICAL DATA:  Shortness of breath. EXAM: CHEST - 2 VIEW COMPARISON:  03/20/2021. FINDINGS: Clear lungs. Normal heart size and mediastinal contours. No pleural effusion or pneumothorax. Visualized bones and upper abdomen are unremarkable. IMPRESSION: No evidence of acute cardiopulmonary disease. Electronically Signed   By: Emmit Alexanders M.D.   On: 06/28/2022 16:43    Procedures Procedures    Medications Ordered in ED Medications  albuterol (VENTOLIN HFA) 108 (90 Base) MCG/ACT inhaler 2 puff (2 puffs Inhalation Given 06/28/22 1734)  potassium chloride SA (KLOR-CON M) CR tablet 40 mEq (40 mEq Oral Given 06/28/22 1844)  dexamethasone (DECADRON) injection 10 mg (10 mg Intravenous Given  06/28/22 1845)    ED Course/  Medical Decision Making/ A&P                             Medical Decision Making Amount and/or Complexity of Data Reviewed Labs: ordered. Decision-making details documented in ED Course. Radiology: ordered. Decision-making details documented in ED Course. ECG/medicine tests: ordered. Decision-making details documented in ED Course.  Risk Prescription drug management.   Medical Decision Making:   Chalina Denigris is a 46 y.o. female who presented to the ED today with multiple complaints detailed above, primarily cough, chest and back pain.    Patient's presentation is complicated by their history of early CAD, asthma.  Patient placed on continuous vitals and telemetry monitoring while in ED which was reviewed periodically.  Complete initial physical exam performed, notably the patient  was in no acute distress, LCTA, RRR, neurologically intact. She had tenderness over left chest wall and left trapezius muscles that reproduced complaint. No meningismus.  Reviewed and confirmed nursing documentation for past medical history, family history, social history.    Initial Assessment:   With the patient's presentation, most likely diagnosis is viral syndrome. Differential diagnosis includes but is not limited to COVID, influenza, RSV, bronchitis, pneumonia, bronchitis, asthma exacerbation, seasonal allergies, strep pharyngitis, viral pharyngitis, electrolyte disturbance, AKI, muscle strain, costochondritis, meningitis, ACS, dysrhythmia.  This is most consistent with an acute complicated illness  Initial Plan:  Screening labs including CBC and Metabolic panel to evaluate for infectious or metabolic etiology of disease.  Viral and strep swabs CXR to evaluate for structural/infectious intrathoracic pathology.  EKG and troponin to evaluate for cardiac pathology Decadron for treatment of likely underlying bronchitis vs asthma exacerbation Objective evaluation as reviewed   Initial Study Results:    Laboratory  All laboratory results reviewed without evidence of clinically relevant pathology.   Exceptions include: K 2.8, Cr 1.11 -- consistent with baseline   EKG EKG was reviewed independently. ST segments without concerns for elevations.   EKG: normal EKG, normal sinus rhythm.   Radiology:  All images reviewed independently. Agree with radiology report at this time.   DG Chest 2 View  Result Date: 06/28/2022 CLINICAL DATA:  Shortness of breath. EXAM: CHEST - 2 VIEW COMPARISON:  03/20/2021. FINDINGS: Clear lungs. Normal heart size and mediastinal contours. No pleural effusion or pneumothorax. Visualized bones and upper abdomen are unremarkable. IMPRESSION: No evidence of acute cardiopulmonary disease. Electronically Signed   By: Emmit Alexanders M.D.   On: 06/28/2022 16:43     Final Assessment and Plan:   This is a 47 year old female presenting to ED with multiple complaints, most notably concern for uncontrolled asthma, chest and back pain. She reports recently requiring increased usage of home inhaler which she has used until completing inhaler at home and she is unable to get new prescription as she is currently awaiting appointment to establish primary care. She notes she has developed multiple other symptoms due to prolonged cough, most notably being pain in her left chest and upper back that is worse with coughing. She notes her primary concern today being her heart due to early family history of CAD in the 70s as well as her asthma since she is not out of her home inhaler. On exam, she is hypertensive but afebrile and with normal oxygen saturations on room air and in no acute distress. Heart exam was unremarkable, lungs clear to auscultation. Pt neurologically intact. ENT exam benign.  Abdomen soft and nontender. Pt has tenderness over L chest wall and L trapezius that reproduces these complaints. She states she most notices a soreness in these areas with coughing and believes she strained  a muscle. She showed no signs of respiratory distress and with LCTA no indication for further treatment of her asthma at this time but inhaler was ordered for her to take home while awaiting follow up with PCP in a couple week. EKG NSR without acute ST-T changes. Dose of Decadron given to treat any underlying bronchitis with lingering cough. Overall, workup reassuring. She did have a hypokalemia as above. Given dose of oral replacement. She did not desire IV replacement during visit today. She has been on prescription K in the past but is not currently taking it so new Rx provided while awaiting PCP follow up. CXR negative, troponin negative, viral and strep swabs negative. Otherwise essentially unremarkable workup. Discussed all findings with pt. She remains hypertensive but does report adherence with anti-hypertensives and has upcoming appointment to discuss better control of her chronic conditions. Apart from the tenderness over chest wall and back, she is having no other chest pain, shortness of breath, lightheadedness, dizziness during today's ED visit so I believe she is safe to follow up with PCP and continue her home medications given reassuring cardiac workup. Pt aware of need for close follow up with PCP. Strict ED return precautions given, all questions answered, and stable for discharge.    Clinical Impression:  1. Chest wall pain   2. Muscle strain of upper back   3. Hypokalemia   4. Bronchitis      Discharge           Final Clinical Impression(s) / ED Diagnoses Final diagnoses:  Chest wall pain  Muscle strain of upper back  Hypokalemia  Bronchitis    Rx / DC Orders ED Discharge Orders          Ordered    potassium chloride SA (KLOR-CON M) 20 MEQ tablet  Daily        06/28/22 1841    potassium chloride SA (KLOR-CON M) 20 MEQ tablet  Daily        06/28/22 1841    cyclobenzaprine (FLEXERIL) 10 MG tablet  3 times daily PRN        06/28/22 1841               Suzzette Righter, PA-C 06/29/22 1509    Malvin Johns, MD 07/08/22 [phone]

## 2022-06-30 DIAGNOSIS — M2140 Flat foot [pes planus] (acquired), unspecified foot: Secondary | ICD-10-CM | POA: Insufficient documentation

## 2022-08-03 DIAGNOSIS — Z7409 Other reduced mobility: Secondary | ICD-10-CM | POA: Insufficient documentation

## 2022-10-25 ENCOUNTER — Ambulatory Visit (INDEPENDENT_AMBULATORY_CARE_PROVIDER_SITE_OTHER): Payer: Medicaid Other | Admitting: Internal Medicine

## 2022-10-25 ENCOUNTER — Encounter: Payer: Self-pay | Admitting: Internal Medicine

## 2022-10-25 ENCOUNTER — Other Ambulatory Visit: Payer: Self-pay

## 2022-10-25 VITALS — BP 128/76 | HR 65 | Temp 98.0°F | Resp 20 | Ht 70.0 in

## 2022-10-25 DIAGNOSIS — L308 Other specified dermatitis: Secondary | ICD-10-CM

## 2022-10-25 DIAGNOSIS — J4530 Mild persistent asthma, uncomplicated: Secondary | ICD-10-CM

## 2022-10-25 DIAGNOSIS — K219 Gastro-esophageal reflux disease without esophagitis: Secondary | ICD-10-CM

## 2022-10-25 DIAGNOSIS — J3089 Other allergic rhinitis: Secondary | ICD-10-CM

## 2022-10-25 DIAGNOSIS — L508 Other urticaria: Secondary | ICD-10-CM | POA: Diagnosis not present

## 2022-10-25 DIAGNOSIS — L309 Dermatitis, unspecified: Secondary | ICD-10-CM

## 2022-10-25 MED ORDER — MONTELUKAST SODIUM 10 MG PO TABS: 10.0000 mg | ORAL_TABLET | Freq: Every day | ORAL | 3 refills | Status: DC

## 2022-10-25 MED ORDER — TACROLIMUS 0.1 % EX OINT: TOPICAL_OINTMENT | CUTANEOUS | 3 refills | Status: AC

## 2022-10-25 MED ORDER — LEVOCETIRIZINE DIHYDROCHLORIDE 5 MG PO TABS
ORAL_TABLET | ORAL | 5 refills | Status: DC
Start: 2022-10-25 — End: 2022-12-06

## 2022-10-25 MED ORDER — TRIAMCINOLONE ACETONIDE 0.1 % EX CREA: 1.0000 | TOPICAL_CREAM | Freq: Two times a day (BID) | CUTANEOUS | 3 refills | Status: DC | PRN

## 2022-10-25 MED ORDER — FLUTICASONE PROPIONATE 50 MCG/ACT NA SUSP: 2.0000 | Freq: Every day | NASAL | 4 refills | Status: DC

## 2022-10-25 MED ORDER — HYDROCORTISONE 2.5 % EX OINT: TOPICAL_OINTMENT | CUTANEOUS | 0 refills | Status: AC

## 2022-10-25 MED ORDER — AZELASTINE HCL 0.1 % NA SOLN: 1.0000 | Freq: Two times a day (BID) | NASAL | 5 refills | Status: DC | PRN

## 2022-10-25 MED ORDER — ALBUTEROL SULFATE HFA 108 (90 BASE) MCG/ACT IN AERS: 2.0000 | INHALATION_SPRAY | RESPIRATORY_TRACT | 1 refills | Status: DC | PRN

## 2022-10-25 NOTE — Patient Instructions (Addendum)
Mild persistent asthma-controlled and managed by pulmonary Continue Symbicort as prescribed by Pulmonary May use albuterol 2 puffs every 4 hours as needed for cough, wheeze, tightness in chest, or shortness of breath albuterol 2 puffs 5 to 15 minutes prior to exercise  Reflux Continue to follow-up with GI  Perennial allergic rhinitis with a possible nonallergic component Continue avoidance measures Continue fluticasone nasal spray using 2 sprays each nostril once a day as needed for stuffiness Continue azelastine nasal spray using 1 to 2 sprays each nostril 1-2 times a day as needed for runny nose/drainage down throat Continue montelukast 10 mg once a day.  This will help both her allergies and her asthma. Continue Xyzal as below May use saline nasal spray or saline nasal rinse as needed for nasal symptoms.  Use this prior to any medicated nasal sprays  Atopic Dermatitis:  Daily Care For Maintenance (daily and continue even once eczema controlled) - Use hypoallergenic hydrating ointment at least twice daily.  This must be done daily for control of flares. (Great options include Vaseline, CeraVe, Aquaphor, Aveeno, Cetaphil, VaniCream, etc) - Avoid detergents, soaps or lotions with fragrances/dyes - Limit showers/baths to 5 minutes and use luke warm water instead of hot, pat dry following baths, and apply moisturizer - can use steroid/non-steroid therapy creams as detailed below up to twice weekly for prevention of flares.  For Flares:(add this to maintenance therapy if needed for flares) First apply steroid/non-steroid treatment creams. Wait 5 minutes then apply moisturizer.  - Triamcinolone 0.1% to body for moderate flares-apply topically twice daily to red, raised areas of skin, followed by moisturizer. Do NOT use on face, groin or armpits. - Hydrocortisone 2.5% to face/body-apply topically twice daily to red, raised areas of skin, followed by moisturizer - Non-steroid treatment options:    tacrolimus  apply topically twice daily as needed (can use in place of steroid creams if desires)  Chronic Idiopathic Urticaria with Angioedema - this is defined as hives lasting more than 6 weeks without an identifiable trigger - hives can be from a number of different sources including infections, allergies, vibration, temperature, pressure among many others other possible causes - often an identifiable cause is not determined - some potential triggers include: stress, illness, NSAIDs, aspirin, hormonal changes - you do not have any red flag symptoms to make [place] concerned about secondary causes of hives, but we will screen for these for reassurance with:  tryptase, thryoid labs, and CU index-often elevated in autoimmune hives". - approximately 50% of patients with chronic hives can have some associated swelling of the face/lips/eyelids (this is not a cause for alarm and does not typically progress onto systemic allergic reactions)  Therapy Plan:  -  increase Xyzal (levocetirizine) to 10mg  twice daily - if hives or swelling remain uncontrolled, increase dose of xyzal (levocetirizine) to max dose of 20mg  (2 pills) twice daily- this is maximum dose - can increase or decrease dosing depending on symptom control to a maximum dose of 4 tablets of antihistamine daily. Wait until hives free for at least one month prior to decreasing dose.   - if hives are still uncontrolled with the above regimen, please arrange an appointment for discussion of Xolair (omalizumab)- an injectable medication for hives  Can use one of the following in place of zyrtec if desires: Claritin (loratadine) 10 mg, Zyrtec (cetirizine)10 mg or Allegra (fexofenadine) 180 mg daily as needed  Follow up : 6-8 weeks, sooner if needed It was a pleasure meeting you in clinic  today! Thank you for allowing me to participate in your care.  Tonny Bollman, MD [facility]       Your blood pressure is elevated while  at today's office visit.  Please schedule a follow-up appointment with your primary care physician to discuss.  Please let us know if this treatment plan is not working well for you. Schedule follow-up appointment in 5 months or sooner if needed

## 2022-10-25 NOTE — Progress Notes (Signed)
FOLLOW UP Date of Service/Encounter:  10/25/22   Subjective:  Colleen Baker (DOB: [birth year]) is a 47 y.o. female who [facility] in re-evaluation of the following: dermatitis, perennial allergic rhinitis with a a possible nonallergic component, mild persistent asthma, and gastroesophageal reflux disease  History obtained from: chart review and patient.  For Review, LV was on 11/23/20  with Nehemiah Settle, FNP seen for routine follow-up. See below for summary of history and diagnostics.  Therapeutic plans/changes recommended: Spirometry normal. Controlled on Flonase, azelastine, and montelukast nasal sprays.  On Dupixent through dermatology.  Using famotidine 20 mg daily and Flovent 110 as block therapy ----------------------------------------------------- Pertinent History/Diagnostics:  Asthma: Cough and wheeze main symptoms. She is now seeing a pulmonologist and is on symbicort as of 10/25/22. Allergic Rhinitis: nonallergic component; with reflux - SPT environmental panel (2017): Positive to molds, cockroach antigen, and dust mite antigen.  Eczema: Previously followed by dermatology.and on Dupixent. - negative food allergen testing in 2017. Chronic urticaria with angioedema. --------------------------------------------------- Today presents for follow-up.  She has new concerns regarding allergies because she has been been having intermittent swelling episodes that lasts usually 24 hours.  This has included the whole side of her face, her lip, etc.  The worst episode lasted 30 hours. She does have itching , but unsure from swelling.  She is also having hives but not necessarily with the swelling.  Angioedema started about 4 months ago. She has not found any consistent triggers. Hives started prior to swelling. She was not sick prior to this starting. She does have life stressors.  She is in therapy for anxiety and depression. She is in  perimenopause currently.   She is no longer seeing dermatology and is no longer on dupixent. She did not have insurance for a while.  She has had more episodes of eczema patches. Inner thighs, upper and lower extremities.  She is out of her topical steroids. Her sleep is affected due to itching.  Asthma - she now follows with pulmonologist who started on symbicort daily. Using albuterol 4-5 times per month during this season. Spring is her worse season.  Rhinitis: she does use nasal sprays.  She does take levocetirizine daily (she believes). She needs refills of these medications and feels controlled upon taking.  Allergies as of 10/25/2022       Reactions   Aciphex [rabeprazole Sodium] Hives   Lisinopril Swelling   Facial    Iodinated Contrast Media Hives, Itching   Contrast dye        Medication List        Accurate as of October 25, 2022 12:44 PM. If you have any questions, ask your nurse or doctor.          STOP taking these medications    alclomethasone 0.05 % cream Commonly known as: ACLOVATE Stopped by: Verlee Monte, MD   benzonatate 100 MG capsule Commonly known as: TESSALON Stopped by: Verlee Monte, MD   celecoxib 100 MG capsule Commonly known as: CELEBREX Stopped by: Verlee Monte, MD   clobetasol cream 0.05 % Commonly known as: TEMOVATE Stopped by: Verlee Monte, MD   clonazePAM 0.25 MG disintegrating tablet Commonly known as: KLONOPIN Stopped by: Verlee Monte, MD   dupilumab 200 MG/1. prefilled syringe Commonly known as: DUPIXENT Stopped by: Verlee Monte, MD   ondansetron 4 MG tablet Commonly known as: ZOFRAN Stopped by: Verlee Monte, MD   potassium chloride  SA 20 MEQ tablet Commonly known as: KLOR-CON M Stopped by: Verlee Monte, MD   rosuvastatin 10 MG tablet Commonly known as: CRESTOR Stopped by: Verlee Monte, MD   SSD 1 % cream Generic drug: silver sulfADIAZINE Stopped by: Verlee Monte, MD       TAKE these medications     albuterol 108 (90 Base) MCG/ACT inhaler Commonly known as: ProAir HFA Inhale 2 puffs into the lungs every 4 (four) hours as needed for wheezing or shortness of breath.   amLODipine 10 MG tablet Commonly known as: NORVASC TAKE ONE-HALF TABLET BY MOUTH DAILY FOR 3 DAYS. INCREASE TO 1 TABLET BY MOUTH DAILY IF TOLERANT   azelastine 0.1 % nasal spray Commonly known as: ASTELIN Place 1-2 sprays into both nostrils 2 (two) times daily as needed.   DULoxetine 60 MG capsule Commonly known as: CYMBALTA TAKE ONE CAPSULE BY MOUTH DAILY   escitalopram 10 MG tablet Commonly known as: LEXAPRO Take 1/2 tablet daily for one week then take 1 tablet daily   Eszopiclone 3 MG Tabs Take 3 mg by mouth at bedtime.   eszopiclone 2 MG Tabs tablet Commonly known as: LUNESTA Take 2 tablets  immediately before bedtime   famotidine 20 MG tablet Commonly known as: PEPCID Take 1 tablet (20 mg total) by mouth daily.   Flovent HFA 110 MCG/ACT inhaler Generic drug: fluticasone Inhale 2 puffs into the lungs 2 (two) times daily as needed. Use during asthma flares.   flunisolide 25 MCG/ACT (0.025%) Soln Commonly known as: NASALIDE 2 sprays each nostril once a day as needed for stuffy nose.   hydrochlorothiazide 25 MG tablet Commonly known as: HYDRODIURIL TAKE ONE TABLET BY MOUTH DAILY   hydrocortisone 2.5 % ointment Apply topically twice daily as need to red sandpapery rash. Started by: Verlee Monte, MD   levocetirizine 5 MG tablet Commonly known as: XYZAL Take up to 2 tablets twice daily as needed for hives and swelling What changed:  how much to take how to take this when to take this additional instructions Changed by: Verlee Monte, MD   mesalamine 1.2 g EC tablet Commonly known as: LIALDA Take 2.4 g by mouth at bedtime.   metoprolol succinate 100 MG 24 hr tablet Commonly known as: TOPROL-XL Take 100 mg by mouth at bedtime. Take with or immediately following a meal.   montelukast  10 MG tablet Commonly known as: SINGULAIR Take 1 tablet (10 mg total) by mouth at bedtime.   potassium chloride 10 MEQ tablet Commonly known as: KLOR-CON Take 10 mEq by mouth daily.   tacrolimus 0.1 % ointment Commonly known as: PROTOPIC Use topically twice daily as needed for red, sandpapery rash What changed: See the new instructions. Changed by: Verlee Monte, MD   triamcinolone cream 0.1 % Commonly known as: KENALOG Apply 1 Application topically 2 (two) times daily as needed (itching). Do not use on face, groin or arm pits. What changed:  how much to take how to take this when to take this reasons to take this additional instructions Changed by: Verlee Monte, MD       Past Medical History:  Diagnosis Date   Anemia    allergy induced   Anxiety    Arthritis    Asthma with allergic rhinitis    Chronic back pain    Crohn's disease in remission (HCC)    Depression    Eczema    GERD (gastroesophageal reflux disease)    Headache  Heart murmur    asymptomatic    History of abnormal cervical Pap smear    History of kidney stones    Hypertension    Irritable bowel    Pinched nerve in neck    Uterine fibroid    Past Surgical History:  Procedure Laterality Date   ABDOMINAL HYSTERECTOMY     BUNIONECTOMY Left    CESAREAN SECTION  08/31/2011   Procedure: CESAREAN SECTION;  Surgeon: Freddrick March. Tenny Craw, MD;  Location: [facility];  Service: Gynecology;  Laterality: N/A;   COLONOSCOPY  last one 2015   CYSTOSCOPY  11/06/2018   Procedure: CYSTOSCOPY;  Surgeon: Carrington Clamp, MD;  Location: [facility];  Service: Gynecology;;   DILATATION & CURETTAGE/HYSTEROSCOPY WITH MYOSURE N/A 01/13/2015   Procedure: DILATATION & CURETTAGE/HYSTEROSCOPY WITH  MYOSURE;  Surgeon: Waynard Reeds, MD;  Location: [facility];  Service: Gynecology;  Laterality: N/A;   ganglion cyst removed Right    hiatal hernia repair  as child   LEEP  1990's   MENISCUS REPAIR  Right 6 yrs ago   MYOMECTOMY ABDOMINAL APPROACH  02/15/2005   NORPLANT REMOVAL Left 11/06/2018   Procedure: REMOVAL OF NEXPLANON;  Surgeon: Carrington Clamp, MD;  Location: [facility];  Service: Gynecology;  Laterality: Left;   ROBOT ASSISTED MYOMECTOMY  06/2010   ROBOTIC ASSISTED LAPAROSCOPIC HYSTERECTOMY AND SALPINGECTOMY Bilateral 11/06/2018   Procedure: XI ROBOTIC ASSISTED LAPAROSCOPIC HYSTERECTOMY WITH BILATERAL SALPINGECTOMY;  Surgeon: Carrington Clamp, MD;  Location: [facility];  Service: Gynecology;  Laterality: Bilateral;   SHOULDER SURGERY     Otherwise, there have been no changes to her past medical history, surgical history, family history, or social history.  ROS: All others negative except as noted per HPI.   Objective:  BP 128/76 (BP Location: Left Arm, Patient Position: Sitting, Cuff Size: Normal)   Pulse 65   Temp 98 F (36.7 C) (Temporal)   Resp 20   Ht 5\' 10"  (1.778 m)   LMP 10/31/2018   SpO2 98%   BMI 28.55 kg/m  Body mass index is 28.55 kg/m. Physical Exam: General Appearance:  Alert, cooperative, no distress, appears stated age  Head:  Normocephalic, without obvious abnormality, atraumatic  Eyes:  Conjunctiva clear, EOM's intact  Nose: Nares normal, hypertrophic turbinates, normal mucosa, and no visible anterior polyps  Throat: Lips, tongue normal; teeth and gums normal, normal posterior oropharynx  Neck: Supple, symmetrical  Lungs:   clear to auscultation bilaterally, Respirations unlabored, no coughing  Heart:  regular rate and rhythm and no murmur, Appears well perfused  Extremities: No edema  Skin: Skin color, texture, turgor normal and no rashes or lesions on visualized portions of skin  Neurologic: No gross deficits   Spirometry:  Tracings reviewed. Her effort: Good reproducible efforts. FVC: 2.26L FEV1: 1.88L, 64% predicted FEV1/FVC ratio: 102% Interpretation: Nonobstructive ratio, low FEV1, possible  restriction.  Please see scanned spirometry results for details.  Labs: See below for chronic urticaria  Assessment/Plan   History concerning for chronic urticaria with angioedema, possibly secondary to hormonal changes from menopause versus autoimmune as she also has underlying Crohn's disease.  Discussed treatment options as below. We did discuss that Dupixent has shown some efficacy for hives, but anecdotally have seen better results with Xolair.  We will try restarting her medications to see if she is controlled and if not may consider Dupixent versus Xolair since she also has atopic dermatitis.   Mild persistent asthma-controlled and managed by pulmonary Continue  Symbicort as prescribed by Pulmonary May use albuterol 2 puffs every 4 hours as needed for cough, wheeze, tightness in chest, or shortness of breath albuterol 2 puffs 5 to 15 minutes prior to exercise  Reflux-stable Continue to follow-up with GI  Perennial allergic rhinitis with a possible nonallergic component-controlled Continue avoidance measures Continue fluticasone nasal spray using 2 sprays each nostril once a day as needed for stuffiness Continue azelastine nasal spray using 1 to 2 sprays each nostril 1-2 times a day as needed for runny nose/drainage down throat Continue montelukast 10 mg once a day.  This will help both her allergies and her asthma. Continue Xyzal as below May use saline nasal spray or saline nasal rinse as needed for nasal symptoms.  Use this prior to any medicated nasal sprays  Atopic Dermatitis:  Daily Care For Maintenance (daily and continue even once eczema controlled) - Use hypoallergenic hydrating ointment at least twice daily.  This must be done daily for control of flares. (Great options include Vaseline, CeraVe, Aquaphor, Aveeno, Cetaphil, VaniCream, etc) - Avoid detergents, soaps or lotions with fragrances/dyes - Limit showers/baths to 5 minutes and use luke warm water instead of hot,  pat dry following baths, and apply moisturizer - can use steroid/non-steroid therapy creams as detailed below up to twice weekly for prevention of flares.  For Flares:(add this to maintenance therapy if needed for flares) First apply steroid/non-steroid treatment creams. Wait 5 minutes then apply moisturizer.  - Triamcinolone 0.1% to body for moderate flares-apply topically twice daily to red, raised areas of skin, followed by moisturizer. Do NOT use on face, groin or armpits. - Hydrocortisone 2.5% to face/body-apply topically twice daily to red, raised areas of skin, followed by moisturizer - Non-steroid treatment options:  tacrolimus apply topically twice daily as needed (can use in place of steroid creams if desires)  Chronic Idiopathic Urticaria with Angioedema - this is defined as hives lasting more than 6 weeks without an identifiable trigger - hives can be from a number of different sources including infections, allergies, vibration, temperature, pressure among many others other possible causes - often an identifiable cause is not determined - some potential triggers include: stress, illness, NSAIDs, aspirin, hormonal changes - you do not have any red flag symptoms to make [place] concerned about secondary causes of hives, but we will screen for these for reassurance with: tryptase, thryoid labs, and CU index-often elevated in autoimmune hives". - approximately 50% of patients with chronic hives can have some associated swelling of the face/lips/eyelids (this is not a cause for alarm and does not typically progress onto systemic allergic reactions)  Therapy Plan:  -  increase Xyzal (levocetirizine) to 10mg  twice daily - if hives or swelling remain uncontrolled, increase dose of xyzal (levocetirizine) to max dose of 20mg  (2 pills) twice daily- this is maximum dose - can increase or decrease dosing depending on symptom control to a maximum dose of 4 tablets of antihistamine daily. Wait until  hives free for at least one month prior to decreasing dose.   - if hives are still uncontrolled with the above regimen, please arrange an appointment for discussion of Xolair (omalizumab)- an injectable medication for hives  Can use one of the following in place of zyrtec if desires: Claritin (loratadine) 10 mg, Zyrtec (cetirizine)10 mg or Allegra (fexofenadine) 180 mg daily as needed  Follow up : 6-8 weeks, sooner if needed It was a pleasure meeting you in clinic today! Thank you for allowing me to participate in your  care.   Tonny Bollman, MD  [facility]

## 2022-10-27 ENCOUNTER — Other Ambulatory Visit: Payer: Self-pay | Admitting: Internal Medicine

## 2022-10-30 ENCOUNTER — Telehealth: Payer: Self-pay

## 2022-10-30 NOTE — Telephone Encounter (Signed)
-----   Message from [facility] sent at 10/26/2022 10:11 AM EDT ----- Result sent to pcp, pt informed of this and referral to endo please advise to endo referral thank you

## 2022-10-30 NOTE — Telephone Encounter (Signed)
Referral has been faxed to the following as the patient lives in [place].   [facility] [address] [place], [place] [address] [phone])  Unfortunately they wouldn't allow me to schedule the patient as they review the referral and contact the patient.    Patient has been updated via MyChart.

## 2022-11-03 LAB — TSH+FREE T4
Free T4: 1.1 ng/dL (ref 0.82–1.77)
TSH: 6.62 u[IU]/mL — ABNORMAL HIGH (ref 0.450–4.500)

## 2022-11-03 LAB — TRYPTASE: Tryptase: 5.8 ug/L (ref 2.2–13.2)

## 2022-11-03 LAB — CHRONIC URTICARIA: cu index: 4 (ref <10)

## 2022-11-03 NOTE — Progress Notes (Signed)
Please let her know that ll of her labs have returned.   She does not have the marker for autoimmune labs, but her thyroid studies were negative as previously mentioned. Continue plan as discussed in clinic.  [facility]-has her endo referral been made?

## 2022-12-06 ENCOUNTER — Other Ambulatory Visit: Payer: Self-pay

## 2022-12-06 ENCOUNTER — Other Ambulatory Visit (HOSPITAL_COMMUNITY): Payer: Self-pay

## 2022-12-06 ENCOUNTER — Encounter: Payer: Self-pay | Admitting: Internal Medicine

## 2022-12-06 ENCOUNTER — Telehealth: Payer: Self-pay | Admitting: *Deleted

## 2022-12-06 ENCOUNTER — Ambulatory Visit (INDEPENDENT_AMBULATORY_CARE_PROVIDER_SITE_OTHER): Payer: Medicaid Other | Admitting: Internal Medicine

## 2022-12-06 VITALS — BP 156/92 | HR 79 | Temp 98.7°F | Resp 20 | Wt 203.6 lb

## 2022-12-06 DIAGNOSIS — L308 Other specified dermatitis: Secondary | ICD-10-CM | POA: Diagnosis not present

## 2022-12-06 DIAGNOSIS — J4530 Mild persistent asthma, uncomplicated: Secondary | ICD-10-CM

## 2022-12-06 DIAGNOSIS — K219 Gastro-esophageal reflux disease without esophagitis: Secondary | ICD-10-CM

## 2022-12-06 DIAGNOSIS — J3089 Other allergic rhinitis: Secondary | ICD-10-CM

## 2022-12-06 DIAGNOSIS — L309 Dermatitis, unspecified: Secondary | ICD-10-CM

## 2022-12-06 DIAGNOSIS — L508 Other urticaria: Secondary | ICD-10-CM | POA: Diagnosis not present

## 2022-12-06 MED ORDER — LEVOCETIRIZINE DIHYDROCHLORIDE 5 MG PO TABS
ORAL_TABLET | ORAL | 5 refills | Status: DC
Start: 2022-12-06 — End: 2023-04-27

## 2022-12-06 MED ORDER — DUPIXENT 300 MG/2ML ~~LOC~~ SOSY
600.0000 mg | PREFILLED_SYRINGE | Freq: Once | SUBCUTANEOUS | 11 refills | Status: DC
  Filled 2022-12-06: qty 4, 1d supply, fill #0
  Filled 2022-12-12: qty 4, 28d supply, fill #0
  Filled 2023-01-04: qty 4, 28d supply, fill #1
  Filled 2023-02-14: qty 4, 28d supply, fill #2
  Filled 2023-03-21: qty 4, 28d supply, fill #3
  Filled 2023-04-19: qty 4, 28d supply, fill #4
  Filled 2023-05-24: qty 4, 28d supply, fill #5
  Filled 2023-06-14 – 2023-08-01 (×2): qty 4, 28d supply, fill #6
  Filled 2023-08-23: qty 4, 28d supply, fill #7
  Filled 2023-10-04: qty 4, 28d supply, fill #8
  Filled 2023-10-30: qty 4, 28d supply, fill #9
  Filled 2023-11-29: qty 4, 28d supply, fill #10

## 2022-12-06 MED ORDER — MONTELUKAST SODIUM 10 MG PO TABS: 10.0000 mg | ORAL_TABLET | Freq: Every day | ORAL | 1 refills | Status: DC

## 2022-12-06 NOTE — Patient Instructions (Addendum)
Chronic Idiopathic Urticaria with Angioedema - this is defined as hives lasting more than 6 weeks without an identifiable trigger - hives can be from a number of different sources including infections, allergies, vibration, temperature, pressure among many others other possible causes - often an identifiable cause is not determined - some potential triggers include: stress, illness, NSAIDs, aspirin, hormonal changes - approximately 50% of patients with chronic hives can have some associated swelling of the face/lips/eyelids (this is not a cause for alarm and does not typically progress onto systemic allergic reactions)  Therapy Plan:  -  increase Xyzal (levocetirizine) to 10mg  twice daily - if hives or swelling remain uncontrolled, increase dose of xyzal (levocetirizine) to max dose of 20mg  (2 pills) twice daily- this is maximum dose - can increase or decrease dosing depending on symptom control to a maximum dose of 4 tablets of antihistamine daily. Wait until hives free for at least one month prior to decreasing dose.   - if hives are still uncontrolled with the above regimen, please arrange an appointment for discussion of Xolair (omalizumab)- an injectable medication for hives  Can use one of the following in place of zyrtec if desires: Claritin (loratadine) 10 mg, Zyrtec (cetirizine)10 mg or Allegra (fexofenadine) 180 mg daily as needed  Perennial allergic rhinitis with a possible nonallergic component Continue avoidance measures Continue fluticasone nasal spray using 2 sprays each nostril once a day as needed for stuffiness Continue azelastine nasal spray using 1 to 2 sprays each nostril 1-2 times a day as needed for runny nose/drainage down throat Continue montelukast 10 mg once a day.  This will help both her allergies and her asthma. Continue Xyzal as below May use saline nasal spray or saline nasal rinse as needed for nasal symptoms.  Use this prior to any medicated nasal  sprays  Atopic Dermatitis:  Daily Care For Maintenance (daily and continue even once eczema controlled) - Use hypoallergenic hydrating ointment at least twice daily.  This must be done daily for control of flares. (Great options include Vaseline, CeraVe, Aquaphor, Aveeno, Cetaphil, VaniCream, etc) - Avoid detergents, soaps or lotions with fragrances/dyes - Limit showers/baths to 5 minutes and use luke warm water instead of hot, pat dry following baths, and apply moisturizer - can use steroid/non-steroid therapy creams as detailed below up to twice weekly for prevention of flares.  For Flares:(add this to maintenance therapy if needed for flares) First apply steroid/non-steroid treatment creams. Wait 5 minutes then apply moisturizer.  - Triamcinolone 0.1% to body for moderate flares-apply topically twice daily to red, raised areas of skin, followed by moisturizer. Do NOT use on face, groin or armpits. - Hydrocortisone 2.5% to face/body-apply topically twice daily to red, raised areas of skin, followed by moisturizer - Non-steroid treatment options:   tacrolimus  apply topically twice daily as needed (can use in place of steroid creams if desires)  Will submit for dupixent, may also help hives.  We will submit paperwork for Dupixent. You will hear from our biologics coordinator Tammy VonCannon. Please answer her phone calls to ensure a seamless approval process.   Elevated TSH: thyroid levels - follow-up with endocrinology as planned. 03/28/23. [facility] [address] [place], [place] [address] [phone]  Mild persistent asthma-controlled and managed by pulmonary Continue Symbicort as prescribed by Pulmonary May use albuterol 2 puffs every 4 hours as needed for cough, wheeze, tightness in chest, or shortness of breath albuterol 2 puffs 5 to  15 minutes prior to exercise  Reflux Continue to follow-up with GI   Follow up : 12  weeks, sooner if needed It was a pleasure meeting you in clinic today! Thank you for allowing me to participate in your care.  Tonny Bollman, MD [facility]       Your blood pressure is elevated while at today's office visit.  Please schedule a follow-up appointment with your primary care physician to discuss.  Please let us know if this treatment plan is not working well for you. Schedule follow-up appointment in 5 months or sooner if needed

## 2022-12-06 NOTE — Telephone Encounter (Signed)
Called patient and advised approval and submit to [place] to restart Dupixent in home. She has previously been on therapy

## 2022-12-06 NOTE — Progress Notes (Signed)
FOLLOW UP Date of Service/Encounter:  12/06/22  Subjective:  Colleen Baker (DOB: 09-08-1975) is a 47 y.o. female who [facility] in re-evaluation of the following: Chronic urticaria with angioedema, atopic dermatitis, allergic rhinitis and conjunctivitis with irritant rhinitis component, asthma and reflux History obtained from: chart review and patient.  For Review, LV was on 10/25/22  with Dr.Kelin Borum seen for routine follow-up. See below for summary of history and diagnostics.   Therapeutic plans/changes recommended: FEV1 64% We obtained labs for chronic urticaria and angioedema.  Recommended high-dose antihistamines.  Discussed Dupixent for eczema and possible help with hives versus Xolair for hives. ----------------------------------------------------- Pertinent History/Diagnostics:  Asthma: Cough and wheeze main symptoms. She is now seeing a pulmonologist and is on symbicort as of 10/25/22. Spring worse season. Allergic Rhinitis: nonallergic component; with reflux - SPT environmental panel (2017): Positive to molds, cockroach antigen, and dust mite antigen.  Eczema: Previously followed by dermatology.and on Dupixent. No longer due to lapse in insurance. Eczema flared after stopping. - negative food allergen testing in 2017. Chronic urticaria with angioedema. Perimenopausal, + life stressors. Has Crohn's disease. - 10/25/22: tryptase 5.8, CU 4.0, TSH 6.620 (elevated), fT4 1.10-referred to endocrinology --------------------------------------------------- Today presents for follow-up. She woke up this morning with lip swelling.  This is occurring with a regular frequency-typically 3-4 times per month. May happen a few days in a row. Even when she takes her medications. Usually resolves within 12 hours.  Usually will feel itching prior to swelling. She is taking levocetirizine 5 mg daily and singulair daily.  She has not tried taking  levocetirizine multiple times per day.  She does continue to have hives.  She continues to have eczema flares on her scalp, arms, and lateral sides and anterior trunk. She did see dermatologist since last visit, and nothing changed.  She was previously on Dupixent and felt more controlled.  She is interested in restarting.  She has been sneezing a lot recently.  She is having a lot of headaches.  She notices this a lot when the heat has been on her car.  She is using the nasal sprays but not as often as she thinks she could.   Her asthma has been pretty controlled. She did use her rescue inhaler more than usual last week but related to the weather. Her reflux is not controlled.  She continues following with GI and pulmonary.   She was found to have an elevated TSH at her last visit.  She has been scheduled for follow-up with endocrinology, but her first visit is not until December.  All medications reviewed by clinical staff and updated in chart. No new pertinent medical or surgical history except as noted in HPI.  ROS: All others negative except as noted per HPI.   Objective:  BP (!) 156/92 (BP Location: Left Arm, Patient Position: Sitting, Cuff Size: Normal)   Pulse 79   Temp 98.7 F (37.1 C) (Temporal)   Resp 20   Wt 203 lb 9.6 oz (92.4 kg)   LMP 10/31/2018   SpO2 99%   BMI 29.21 kg/m  Body mass index is 29.21 kg/m. Physical Exam: General Appearance:  Alert, cooperative, no distress, appears stated age  Head:  Normocephalic, without obvious abnormality, atraumatic  Eyes:  Conjunctiva clear, EOM's intact  Ears EACs normal bilaterally and normal TMs bilaterally  Nose: Nares normal, hypertrophic turbinates, normal mucosa, and no visible anterior polyps  Throat: Lips with angioedema of top lip,  tongue normal; teeth and gums normal, normal posterior oropharynx  Neck: Supple, symmetrical  Lungs:   clear to auscultation bilaterally, Respirations unlabored, no coughing  Heart:   regular rate and rhythm and no murmur, Appears well perfused  Extremities: No edema  Skin: No visible hives, few eczematous patches underneath breast  Neurologic: No gross deficits   Labs:  Lab Orders  No laboratory test(s) ordered today    Assessment/Plan   Chronic Idiopathic Urticaria with Angioedema-not at goal - this is defined as hives lasting more than 6 weeks without an identifiable trigger - hives can be from a number of different sources including infections, allergies, vibration, temperature, pressure among many others other possible causes - often an identifiable cause is not determined - some potential triggers include: stress, illness, NSAIDs, aspirin, hormonal changes - approximately 50% of patients with chronic hives can have some associated swelling of the face/lips/eyelids (this is not a cause for alarm and does not typically progress onto systemic allergic reactions)  Therapy Plan:  -  increase Xyzal (levocetirizine) to 10mg  twice daily - if hives or swelling remain uncontrolled, increase dose of xyzal (levocetirizine) to max dose of 20mg  (2 pills) twice daily- this is maximum dose - can increase or decrease dosing depending on symptom control to a maximum dose of 4 tablets of antihistamine daily. Wait until hives free for at least one month prior to decreasing dose.   - if hives are still uncontrolled with the above regimen, please arrange an appointment for discussion of Xolair (omalizumab)- an injectable medication for hives  Can use one of the following in place of zyrtec if desires: Claritin (loratadine) 10 mg, Zyrtec (cetirizine)10 mg or Allegra (fexofenadine) 180 mg daily as needed  Perennial allergic rhinitis with a possible nonallergic (irritant) component-at goal Continue avoidance measures Continue fluticasone nasal spray using 2 sprays each nostril once a day as needed for stuffiness Continue azelastine nasal spray using 1 to 2 sprays each nostril 1-2 times  a day as needed for runny nose/drainage down throat Continue montelukast 10 mg once a day.  This will help both her allergies and her asthma. Continue Xyzal as below May use saline nasal spray or saline nasal rinse as needed for nasal symptoms.  Use this prior to any medicated nasal sprays  Atopic Dermatitis: not at goal Daily Care For Maintenance (daily and continue even once eczema controlled) - Use hypoallergenic hydrating ointment at least twice daily.  This must be done daily for control of flares. (Great options include Vaseline, CeraVe, Aquaphor, Aveeno, Cetaphil, VaniCream, etc) - Avoid detergents, soaps or lotions with fragrances/dyes - Limit showers/baths to 5 minutes and use luke warm water instead of hot, pat dry following baths, and apply moisturizer - can use steroid/non-steroid therapy creams as detailed below up to twice weekly for prevention of flares.  For Flares:(add this to maintenance therapy if needed for flares) First apply steroid/non-steroid treatment creams. Wait 5 minutes then apply moisturizer.  - Triamcinolone 0.1% to body for moderate flares-apply topically twice daily to red, raised areas of skin, followed by moisturizer. Do NOT use on face, groin or armpits. - Hydrocortisone 2.5% to face/body-apply topically twice daily to red, raised areas of skin, followed by moisturizer - Non-steroid treatment options:  tacrolimus apply topically twice daily as needed (can use in place of steroid creams if desires)  Will submit for dupixent, may also help hives.  We will submit paperwork for Dupixent. You will hear from our biologics coordinator Tammy VonCannon. Please  answer her phone calls to ensure a seamless approval process.   Elevated TSH: thyroid levels - follow-up with endocrinology as planned. 03/28/23. [facility] [address] [place], [place] [address] [phone]  Mild persistent asthma-controlled and  managed by pulmonary-at goal Continue Symbicort as prescribed by Pulmonary May use albuterol 2 puffs every 4 hours as needed for cough, wheeze, tightness in chest, or shortness of breath albuterol 2 puffs 5 to 15 minutes prior to exercise  Reflux-not at goall Continue to follow-up with GI   Follow up : 12 weeks, sooner if needed It was a pleasure meeting you in clinic today! Thank you for allowing me to participate in your care.  Other: none  Tonny Bollman, MD  [facility]

## 2022-12-06 NOTE — Telephone Encounter (Signed)
-----   Message from Verlee Monte sent at 12/06/2022 12:21 PM EDT ----- Can we see about getting her on Dupixent for eczema?  Was previously on it through dermatology, but had a lapse in insurance.  She also has hives so I'm hoping this will help as well.

## 2022-12-07 ENCOUNTER — Ambulatory Visit (INDEPENDENT_AMBULATORY_CARE_PROVIDER_SITE_OTHER): Payer: Medicaid Other

## 2022-12-07 ENCOUNTER — Ambulatory Visit: Payer: Self-pay

## 2022-12-07 ENCOUNTER — Other Ambulatory Visit: Payer: Self-pay

## 2022-12-07 DIAGNOSIS — L209 Atopic dermatitis, unspecified: Secondary | ICD-10-CM

## 2022-12-07 MED ORDER — DUPILUMAB 300 MG/2ML ~~LOC~~ SOSY
600.0000 mg | PREFILLED_SYRINGE | Freq: Once | SUBCUTANEOUS | Status: AC
Start: 2022-12-07 — End: 2022-12-07
  Administered 2022-12-07: 600 mg via SUBCUTANEOUS

## 2022-12-07 NOTE — Progress Notes (Signed)
Immunotherapy   Patient Details  Name: Colleen Baker MRN: [id number] Date of Birth: 20-Feb-1976  12/07/2022  [name] started Dupixent 300 mg Q 2 weeks for Atopic Dermatitis. Pt receiving shot at home.  Epi-Pen: No Consent signed and patient instructions given.   Modesto Charon 12/07/2022, 1:17 PM

## 2022-12-11 ENCOUNTER — Other Ambulatory Visit: Payer: Self-pay

## 2022-12-12 ENCOUNTER — Other Ambulatory Visit: Payer: Self-pay

## 2022-12-25 ENCOUNTER — Other Ambulatory Visit: Payer: Self-pay | Admitting: Internal Medicine

## 2023-01-04 ENCOUNTER — Other Ambulatory Visit (HOSPITAL_COMMUNITY): Payer: Self-pay

## 2023-01-16 ENCOUNTER — Other Ambulatory Visit: Payer: Self-pay

## 2023-01-16 ENCOUNTER — Ambulatory Visit (INDEPENDENT_AMBULATORY_CARE_PROVIDER_SITE_OTHER): Payer: Medicaid Other | Admitting: Family Medicine

## 2023-01-16 ENCOUNTER — Encounter: Payer: Self-pay | Admitting: Family Medicine

## 2023-01-16 VITALS — BP 122/60 | HR 75 | Temp 97.9°F | Resp 18 | Ht 70.0 in | Wt 206.3 lb

## 2023-01-16 DIAGNOSIS — J302 Other seasonal allergic rhinitis: Secondary | ICD-10-CM

## 2023-01-16 DIAGNOSIS — H1013 Acute atopic conjunctivitis, bilateral: Secondary | ICD-10-CM | POA: Diagnosis not present

## 2023-01-16 DIAGNOSIS — K219 Gastro-esophageal reflux disease without esophagitis: Secondary | ICD-10-CM

## 2023-01-16 DIAGNOSIS — J4540 Moderate persistent asthma, uncomplicated: Secondary | ICD-10-CM | POA: Diagnosis not present

## 2023-01-16 DIAGNOSIS — H1010 Acute atopic conjunctivitis, unspecified eye: Secondary | ICD-10-CM | POA: Insufficient documentation

## 2023-01-16 DIAGNOSIS — J3089 Other allergic rhinitis: Secondary | ICD-10-CM | POA: Diagnosis not present

## 2023-01-16 DIAGNOSIS — L508 Other urticaria: Secondary | ICD-10-CM

## 2023-01-16 DIAGNOSIS — L209 Atopic dermatitis, unspecified: Secondary | ICD-10-CM

## 2023-01-16 MED ORDER — FLUTICASONE PROPIONATE 50 MCG/ACT NA SUSP: 2.0000 | Freq: Every day | NASAL | 4 refills | Status: DC | PRN

## 2023-01-16 MED ORDER — BUDESONIDE-FORMOTEROL FUMARATE 160-4.5 MCG/ACT IN AERO: 2.0000 | INHALATION_SPRAY | Freq: Two times a day (BID) | RESPIRATORY_TRACT | 5 refills | Status: DC

## 2023-01-16 NOTE — Patient Instructions (Addendum)
Asthma Continue with montelukast 10 mg once a day to prevent cough or wheeze Continue with Symbicort 160-2 puffs twice a day with a spacer to prevent cough or wheeze Continue albuterol 2 puffs once every 4 hours if needed for cough or wheeze You may use albuterol 2 puffs 5 to 15 minutes before activity to decrease cough or wheeze Continue Dupixent 300 mg once every 2 weeks for control of asthma symptoms Continue to follow-up with your pulmonology group at [facility] as recommended For the next 1-2 weeks, use albuterol about 5 minutes before you use Symbicort 160-2 puffs twice a day with a spacer. Then, when your breathing returns to baseline, stop using albuterol as a pretreatment and just use albuterol as needed   Allergic rhinitis Continue allergen avoidance measures directed toward indoor and outdoor molds as well as dust mites as listed below Begin Flonase 2 sprays in each nostril once a day as needed for stuffy nose.  In the right nostril, point the applicator out toward the right ear. In the left nostril, point the applicator out toward the left ear Continue montelukast as listed above for allergy symptom control Continue Xyzal 5 mg once a day as needed for runny nose or itch Continue azelastine 2 sprays in each nostril up to twice a day as needed for runny nose Consider saline nasal rinses as needed for nasal symptoms. Use this before any medicated nasal sprays for best result Consider allergen immunotherapy if your symptoms are not well-controlled with the treatment plan as listed above  Allergic conjunctivitis Some over the counter eye drops include Pataday one drop in each eye once a day as needed for red, itchy eyes OR Zaditor one drop in each eye twice a day as needed for red itchy eyes. Avoid eye drops that say red eye relief as they may contain medications that dry out your eyes.   Atopic Dermatitis:  Daily Care For Maintenance (daily and continue  even once eczema controlled) - Use hypoallergenic hydrating ointment at least twice daily.  This must be done daily for control of flares. (Great options include Vaseline, CeraVe, Aquaphor, Aveeno, Cetaphil, VaniCream, etc) - Avoid detergents, soaps or lotions with fragrances/dyes - Limit showers/baths to 5 minutes and use luke warm water instead of hot, pat dry following baths, and apply moisturizer - can use steroid/non-steroid therapy creams as detailed below up to twice weekly for prevention of flares.   For Flares:(add this to maintenance therapy if needed for flares) First apply steroid/non-steroid treatment creams. Wait 5 minutes then apply moisturizer.  - Triamcinolone 0.1% to body for moderate flares-apply topically twice daily to red, raised areas of skin, followed by moisturizer. Do NOT use on face, groin or armpits. - Hydrocortisone 2.5% to face/body-apply topically twice daily to red, raised areas of skin, followed by moisturizer - Non-steroid treatment options:   tacrolimus  apply topically twice daily as needed (can use in place of steroid creams if desires) - Continue Dupixent injections for control of atopic dermatitis  Reflux Continue dietary and lifestyle modifications Continue a PPI twice a day and restart famotidine twice a day as recommended by your GI specialist Follow-up with your GI specialist as recommended  Elevated TSH Follow-up with endocrinology as planned on 03/28/23. [facility] - [address] [place], [place] [address] [phone]  Call the clinic if this treatment plan is not working well for you.  Follow up in 2 months  or sooner if needed.  Control of Mold Allergen Mold and fungi can grow on a variety of surfaces provided certain temperature and moisture conditions exist.  Outdoor molds grow on plants, decaying vegetation and soil.  The major outdoor mold, Alternaria and Cladosporium, are found in  very high numbers during hot and dry conditions.  Generally, a late Summer - Fall peak is seen for common outdoor fungal spores.  Rain will temporarily lower outdoor mold spore count, but counts rise rapidly when the rainy period ends.  The most important indoor molds are Aspergillus and Penicillium.  Dark, humid and poorly ventilated basements are ideal sites for mold growth.  The next most common sites of mold growth are the bathroom and the kitchen.  Outdoor [employer] Use air conditioning and keep windows closed Avoid exposure to decaying vegetation. Avoid leaf raking. Avoid grain handling. Consider wearing a face mask if working in moldy areas.  Indoor Mold Control Maintain humidity below 50%. Clean washable surfaces with 5% bleach solution. Remove sources e.g. Contaminated carpets.   Control of Dust Mite Allergen Dust mites play a major role in allergic asthma and rhinitis. They occur in environments with high humidity wherever human skin is found. Dust mites absorb humidity from the atmosphere (ie, they do not drink) and feed on organic matter (including shed human and animal skin). Dust mites are a microscopic type of insect that you cannot see with the naked eye. High levels of dust mites have been detected from mattresses, pillows, carpets, upholstered furniture, bed covers, clothes, soft toys and any woven material. The principal allergen of the dust mite is found in its feces. A gram of dust may contain 1,000 mites and 250,000 fecal particles. Mite antigen is easily measured in the air during house cleaning activities. Dust mites do not bite and do not cause harm to humans, other than by triggering allergies/asthma.  Ways to decrease your exposure to dust mites in your home:  1. Encase mattresses, box springs and pillows with a mite-impermeable barrier or cover  2. Wash sheets, blankets and drapes weekly in hot water (130 F) with detergent and dry them in a dryer on the hot  setting.  3. Have the room cleaned frequently with a vacuum cleaner and a damp dust-mop. For carpeting or rugs, vacuuming with a vacuum cleaner equipped with a high-efficiency particulate air (HEPA) filter. The dust mite allergic individual should not be in a room which is being cleaned and should wait 1 hour after cleaning before going into the room.  4. Do not sleep on upholstered furniture (eg, couches).  5. If possible removing carpeting, upholstered furniture and drapery from the home is ideal. Horizontal blinds should be eliminated in the rooms where the person spends the most time (bedroom, study, television room). Washable vinyl, roller-type shades are optimal.  6. Remove all non-washable stuffed toys from the bedroom. Wash stuffed toys weekly like sheets and blankets above.  7. Reduce indoor humidity to less than 50%. Inexpensive humidity monitors can be purchased at most hardware stores. Do not use a humidifier as can make the problem worse and are not recommended.

## 2023-01-16 NOTE — Progress Notes (Signed)
[address] [place] [address] Dept: [phone]  FOLLOW UP NOTE  Patient ID: Colleen Baker, female    DOB: 09-Feb-1976  Age: 47 y.o. MRN: [id number] Date of Office Visit: 01/16/2023  Assessment  Chief Complaint: Follow-up (Asthma issues ) and Breathing Problem (C/o ha's, increased inhaler use for the last 2 weeks she states it's difficult to catch her breath she hopes this is the end of her worst season for asthma)  HPI Colleen Baker is a 47 year old female who presents to the clinic for follow-up visit.  She was last seen in this clinic on 12/06/2022 by Dr. Maurine Minister for evaluation of asthma, allergic rhinitis, allergic conjunctivitis, chronic urticaria, atopic dermatitis, allergic contact dermatitis, reflux, and abnormal TSH.  Her medical problem list includes Crohn's disease.    At today's visit, she reports her asthma has not been well controlled with symptoms including some shortness of breath with activity at rest and dry cough occurring in the daytime and nighttime.  She continues montelukast 10 mg once a day and reports that over the last week she has increased to Symbicort 160 from 2 puffs once once a day to 2 puffs twice a day.  She reports that she has been using her albuterol 3-4 times a day over the last week.  Previous to 1 to 2 weeks ago she reports her asthma has been well-controlled with albuterol use about once a week as well as Symbicort 160-2 puffs once a day with relief of symptoms. She denies fever, sweats, chills, or sick contacts.  She does report this fall season and weather aggravates her asthma.  She has been using Dupixent for atopic dermatitis control, however, reports no improvement in her asthma symptoms at this time.  She has received her third injection.  She continues to follow-up with her pulmonology specialist, Dr Su Monks, as recommended.  Allergic rhinitis is reported as moderately well-controlled with nasal congestion occurring mainly in the morning and sneezing  as the main symptoms.  She continues azelastine and is not currently using Flonase or nasal saline rinses.  She continues Xyzal once or twice a day as needed with relief of symptoms.  Her last environmental allergy skin testing was on 03/19/2019 was positive to mold 3, mold 4, and dust mite.  Atopic dermatitis is reported as moderately well-controlled with occasional red and itchy areas occurring in the bilateral antecubital fossa as well as extensor surfaces of both elbows.  She continues a daily moisturizing routine and infrequently needs to use triamcinolone or hydrocortisone.  She continues Dupixent injections 300 mg once every 14 days with no large or local reactions.  She reports a significant decrease in her symptoms of atopic dermatitis while continuing Dupixent injections.  Chronic urticaria is reported as moderately well-controlled with 1 week break out since her last visit to this clinic occurring mainly on her arms.  She denies cardiopulmonary or gastrointestinal symptoms with this breakout.  She continues Xyzal 5 mg once or twice a day depending on hives status.  Reflux is reported as moderately well-controlled with heartburn occurring infrequently.  She continues pantoprazole twice a day and reports that she has recently lost her famotidine.  She reports that she generally takes famotidine twice a day.  She continues to follow-up with her gastroenterology specialist at [facility] for management of Crohn's and reflux.  Her current medications are listed in the chart.  Drug Allergies:  Allergies  Allergen Reactions   Aciphex [Rabeprazole Sodium]  Hives   Lisinopril Swelling    Facial    Iodinated Contrast Media Hives and Itching    Contrast dye    Physical Exam: BP 122/60 (BP Location: Left Arm, Patient Position: Sitting, Cuff Size: Normal)   Pulse 75   Temp 97.9 F (36.6 C) (Temporal)   Resp 18   Ht 5\' 10"  (1.778 m)   Wt  206 lb 4.8 oz (93.6 kg)   LMP 10/31/2018   SpO2 99%   BMI 29.60 kg/m    Physical Exam Vitals reviewed.  Constitutional:      Appearance: Normal appearance.  HENT:     Head: Normocephalic and atraumatic.     Right Ear: Tympanic membrane normal.     Left Ear: Tympanic membrane normal.     Nose:     Comments: Bilateral nares slightly erythematous with thin clear nasal drainage noted.  Pharynx normal.  Ears normal.  Eyes normal.    Mouth/Throat:     Pharynx: Oropharynx is clear.  Eyes:     Conjunctiva/sclera: Conjunctivae normal.  Cardiovascular:     Rate and Rhythm: Normal rate and regular rhythm.     Heart sounds: Normal heart sounds. No murmur heard. Pulmonary:     Effort: Pulmonary effort is normal.     Breath sounds: Normal breath sounds.     Comments: Lungs clear to auscultation Musculoskeletal:        General: Normal range of motion.     Cervical back: Normal range of motion and neck supple.  Skin:    General: Skin is warm and dry.  Neurological:     Mental Status: She is alert and oriented to person, place, and time.  Psychiatric:        Mood and Affect: Mood normal.        Behavior: Behavior normal.        Thought Content: Thought content normal.        Judgment: Judgment normal.     Diagnostics: FVC 2.59 which is 70% predicted value, FEV1 2.19 which is 74% predicted value.  Spirometry indicates possible restriction.  Spirometry improved from last visit.  Assessment and Plan: 1. Not well controlled moderate persistent asthma   2. Seasonal and perennial allergic rhinitis   3. Seasonal allergic conjunctivitis   4. Atopic dermatitis, unspecified type   5. Chronic urticaria   6. Gastroesophageal reflux disease, unspecified whether esophagitis present     Meds ordered this encounter  Medications   budesonide-formoterol (SYMBICORT) 160-4.5 MCG/ACT inhaler    Sig: Inhale 2 puffs into the lungs 2 (two) times daily.    Dispense:  1 each    Refill:  5    fluticasone (FLONASE) 50 MCG/ACT nasal spray    Sig: Place 2 sprays into both nostrils daily as needed for allergies or rhinitis.    Dispense:  16 g    Refill:  4    Patient Instructions  Asthma Continue with montelukast 10 mg once a day to prevent cough or wheeze Continue with Symbicort 160-2 puffs twice a day with a spacer to prevent cough or wheeze Continue albuterol 2 puffs once every 4 hours if needed for cough or wheeze You may use albuterol 2 puffs 5 to 15 minutes before activity to decrease cough or wheeze Continue Dupixent 300 mg once every 2 weeks for control of asthma symptoms Continue to follow-up with your pulmonology group at [facility] as recommended For the next 1-2 weeks, use albuterol about  5 minutes before you use Symbicort 160-2 puffs twice a day with a spacer. Then, when your breathing returns to baseline, stop using albuterol as a pretreatment and just use albuterol as needed   Allergic rhinitis Continue allergen avoidance measures directed toward indoor and outdoor molds as well as dust mites as listed below Begin Flonase 2 sprays in each nostril once a day as needed for stuffy nose.  In the right nostril, point the applicator out toward the right ear. In the left nostril, point the applicator out toward the left ear Continue montelukast as listed above for allergy symptom control Continue Xyzal 5 mg once a day as needed for runny nose or itch Continue azelastine 2 sprays in each nostril up to twice a day as needed for runny nose Consider saline nasal rinses as needed for nasal symptoms. Use this before any medicated nasal sprays for best result Consider allergen immunotherapy if your symptoms are not well-controlled with the treatment plan as listed above  Allergic conjunctivitis Some over the counter eye drops include Pataday one drop in each eye once a day as needed for red, itchy eyes OR Zaditor one drop in each eye twice a day as  needed for red itchy eyes. Avoid eye drops that say red eye relief as they may contain medications that dry out your eyes.   Atopic Dermatitis:  Daily Care For Maintenance (daily and continue even once eczema controlled) - Use hypoallergenic hydrating ointment at least twice daily.  This must be done daily for control of flares. (Great options include Vaseline, CeraVe, Aquaphor, Aveeno, Cetaphil, VaniCream, etc) - Avoid detergents, soaps or lotions with fragrances/dyes - Limit showers/baths to 5 minutes and use luke warm water instead of hot, pat dry following baths, and apply moisturizer - can use steroid/non-steroid therapy creams as detailed below up to twice weekly for prevention of flares.   For Flares:(add this to maintenance therapy if needed for flares) First apply steroid/non-steroid treatment creams. Wait 5 minutes then apply moisturizer.  - Triamcinolone 0.1% to body for moderate flares-apply topically twice daily to red, raised areas of skin, followed by moisturizer. Do NOT use on face, groin or armpits. - Hydrocortisone 2.5% to face/body-apply topically twice daily to red, raised areas of skin, followed by moisturizer - Non-steroid treatment options:   tacrolimus  apply topically twice daily as needed (can use in place of steroid creams if desires) - Continue Dupixent injections for control of atopic dermatitis  Reflux Continue dietary and lifestyle modifications Continue a PPI twice a day and restart famotidine twice a day as recommended by your GI specialist Follow-up with your GI specialist as recommended  Elevated TSH Follow-up with endocrinology as planned on 03/28/23. [facility] [address] [place], [place] [address] [phone]  Call the clinic if this treatment plan is not working well for you.  Follow up in 2 months or sooner if needed.   Return in about 2 months (around 03/18/2023), or if symptoms  worsen or fail to improve.    Thank you for the opportunity to care for this patient.  Please do not hesitate to contact me with questions.  Thermon Leyland, FNP [facility]

## 2023-01-17 ENCOUNTER — Other Ambulatory Visit: Payer: Self-pay

## 2023-02-07 ENCOUNTER — Other Ambulatory Visit: Payer: Self-pay

## 2023-02-14 ENCOUNTER — Other Ambulatory Visit: Payer: Self-pay

## 2023-02-14 NOTE — Progress Notes (Signed)
Specialty Pharmacy Refill Coordination Note  Colleen Baker is a 47 y.o. female contacted today regarding refills of specialty medication(s) Dupilumab   Patient requested Delivery   Delivery date: 03/01/23   Verified address: [address], [place], [address]   Medication will be filled on 02/28/23.

## 2023-02-28 ENCOUNTER — Other Ambulatory Visit: Payer: Self-pay

## 2023-03-19 NOTE — Patient Instructions (Incomplete)
Asthma Continue with montelukast 10 mg once a day to prevent cough or wheeze Continue with Symbicort 160-2 puffs twice a day with a spacer to prevent cough or wheeze Continue albuterol 2 puffs once every 4 hours if needed for cough or wheeze You may use albuterol 2 puffs 5 to 15 minutes before activity to decrease cough or wheeze Continue Dupixent 300 mg once every 2 weeks for control of asthma symptoms Continue to follow-up with your pulmonology group at [facility] as recommended For the next 1-2 weeks, use albuterol about 5 minutes before you use Symbicort 160-2 puffs twice a day with a spacer. Then, when your breathing returns to baseline, stop using albuterol as a pretreatment and just use albuterol as needed   Allergic rhinitis Continue allergen avoidance measures directed toward indoor and outdoor molds as well as dust mites as listed below Begin Flonase 2 sprays in each nostril once a day as needed for stuffy nose.  In the right nostril, point the applicator out toward the right ear. In the left nostril, point the applicator out toward the left ear Continue montelukast as listed above for allergy symptom control Continue Xyzal 5 mg once a day as needed for runny nose or itch Continue azelastine 2 sprays in each nostril up to twice a day as needed for runny nose Consider saline nasal rinses as needed for nasal symptoms. Use this before any medicated nasal sprays for best result Consider allergen immunotherapy if your symptoms are not well-controlled with the treatment plan as listed above  Allergic conjunctivitis Some over the counter eye drops include Pataday one drop in each eye once a day as needed for red, itchy eyes OR Zaditor one drop in each eye twice a day as needed for red itchy eyes. Avoid eye drops that say red eye relief as they may contain medications that dry out your eyes.   Atopic Dermatitis:  Daily Care For Maintenance (daily and continue  even once eczema controlled) - Use hypoallergenic hydrating ointment at least twice daily.  This must be done daily for control of flares. (Great options include Vaseline, CeraVe, Aquaphor, Aveeno, Cetaphil, VaniCream, etc) - Avoid detergents, soaps or lotions with fragrances/dyes - Limit showers/baths to 5 minutes and use luke warm water instead of hot, pat dry following baths, and apply moisturizer - can use steroid/non-steroid therapy creams as detailed below up to twice weekly for prevention of flares.   For Flares:(add this to maintenance therapy if needed for flares) First apply steroid/non-steroid treatment creams. Wait 5 minutes then apply moisturizer.  - Triamcinolone 0.1% to body for moderate flares-apply topically twice daily to red, raised areas of skin, followed by moisturizer. Do NOT use on face, groin or armpits. - Hydrocortisone 2.5% to face/body-apply topically twice daily to red, raised areas of skin, followed by moisturizer - Non-steroid treatment options:   tacrolimus  apply topically twice daily as needed (can use in place of steroid creams if desires) - Continue Dupixent injections for control of atopic dermatitis  Reflux Continue dietary and lifestyle modifications Continue a PPI twice a day and restart famotidine twice a day as recommended by your GI specialist Follow-up with your GI specialist as recommended  Elevated TSH Follow-up with endocrinology as planned on 03/28/23. [facility] [address] [place], [place] [address] [phone]  Call the clinic if this treatment plan is not working well for you.  Follow up in 2 months  or sooner if needed.  Control of Mold Allergen Mold and fungi can grow on a variety of surfaces provided certain temperature and moisture conditions exist.  Outdoor molds grow on plants, decaying vegetation and soil.  The major outdoor mold, Alternaria and Cladosporium, are found in  very high numbers during hot and dry conditions.  Generally, a late Summer - Fall peak is seen for common outdoor fungal spores.  Rain will temporarily lower outdoor mold spore count, but counts rise rapidly when the rainy period ends.  The most important indoor molds are Aspergillus and Penicillium.  Dark, humid and poorly ventilated basements are ideal sites for mold growth.  The next most common sites of mold growth are the bathroom and the kitchen.  Outdoor [employer] Use air conditioning and keep windows closed Avoid exposure to decaying vegetation. Avoid leaf raking. Avoid grain handling. Consider wearing a face mask if working in moldy areas.  Indoor Mold Control Maintain humidity below 50%. Clean washable surfaces with 5% bleach solution. Remove sources e.g. Contaminated carpets.   Control of Dust Mite Allergen Dust mites play a major role in allergic asthma and rhinitis. They occur in environments with high humidity wherever human skin is found. Dust mites absorb humidity from the atmosphere (ie, they do not drink) and feed on organic matter (including shed human and animal skin). Dust mites are a microscopic type of insect that you cannot see with the naked eye. High levels of dust mites have been detected from mattresses, pillows, carpets, upholstered furniture, bed covers, clothes, soft toys and any woven material. The principal allergen of the dust mite is found in its feces. A gram of dust may contain 1,000 mites and 250,000 fecal particles. Mite antigen is easily measured in the air during house cleaning activities. Dust mites do not bite and do not cause harm to humans, other than by triggering allergies/asthma.  Ways to decrease your exposure to dust mites in your home:  1. Encase mattresses, box springs and pillows with a mite-impermeable barrier or cover  2. Wash sheets, blankets and drapes weekly in hot water (130 F) with detergent and dry them in a dryer on the hot  setting.  3. Have the room cleaned frequently with a vacuum cleaner and a damp dust-mop. For carpeting or rugs, vacuuming with a vacuum cleaner equipped with a high-efficiency particulate air (HEPA) filter. The dust mite allergic individual should not be in a room which is being cleaned and should wait 1 hour after cleaning before going into the room.  4. Do not sleep on upholstered furniture (eg, couches).  5. If possible removing carpeting, upholstered furniture and drapery from the home is ideal. Horizontal blinds should be eliminated in the rooms where the person spends the most time (bedroom, study, television room). Washable vinyl, roller-type shades are optimal.  6. Remove all non-washable stuffed toys from the bedroom. Wash stuffed toys weekly like sheets and blankets above.  7. Reduce indoor humidity to less than 50%. Inexpensive humidity monitors can be purchased at most hardware stores. Do not use a humidifier as can make the problem worse and are not recommended.

## 2023-03-19 NOTE — Progress Notes (Unsigned)
   [address] [place] [address] Dept: [phone]  FOLLOW UP NOTE  Patient ID: Colleen Baker, female    DOB: 1976-03-17  Age: 47 y.o. MRN: [id number] Date of Office Visit: 03/20/2023  Assessment  Chief Complaint: No chief complaint on file.  HPI Colleen Baker is a 47 year old female who presents to the clinic for follow-up visit.  She was last seen in this clinic on 01/16/2023 by Thermon Leyland, FNP, for evaluation of asthma, allergic rhinitis, allergic conjunctivitis, atopic dermatitis, reflux, and abnormal TSH via lab. Her last environmental allergy skin testing was on 03/19/2019 was positive to mold 3, mold 4, and dust mite.   Discussed the use of AI scribe software for clinical note transcription with the patient, who gave verbal consent to proceed.  History of Present Illness             Drug Allergies:  Allergies  Allergen Reactions   Aciphex [Rabeprazole Sodium] Hives   Lisinopril Swelling    Facial    Iodinated Contrast Media Hives and Itching    Contrast dye    Physical Exam: LMP 10/31/2018    Physical Exam  Diagnostics:    Assessment and Plan: No diagnosis found.  No orders of the defined types were placed in this encounter.   There are no Patient Instructions on file for this visit.  No follow-ups on file.    Thank you for the opportunity to care for this patient.  Please do not hesitate to contact me with questions.  Thermon Leyland, FNP [facility]

## 2023-03-20 ENCOUNTER — Ambulatory Visit: Payer: Medicaid Other | Admitting: Family Medicine

## 2023-03-20 ENCOUNTER — Encounter: Payer: Self-pay | Admitting: Family Medicine

## 2023-03-20 VITALS — BP 160/108 | HR 82 | Temp 97.5°F | Resp 16

## 2023-03-20 DIAGNOSIS — H1010 Acute atopic conjunctivitis, unspecified eye: Secondary | ICD-10-CM

## 2023-03-20 DIAGNOSIS — J4540 Moderate persistent asthma, uncomplicated: Secondary | ICD-10-CM

## 2023-03-20 DIAGNOSIS — J302 Other seasonal allergic rhinitis: Secondary | ICD-10-CM

## 2023-03-20 DIAGNOSIS — H1013 Acute atopic conjunctivitis, bilateral: Secondary | ICD-10-CM

## 2023-03-20 DIAGNOSIS — J3089 Other allergic rhinitis: Secondary | ICD-10-CM | POA: Diagnosis not present

## 2023-03-20 DIAGNOSIS — K219 Gastro-esophageal reflux disease without esophagitis: Secondary | ICD-10-CM

## 2023-03-20 DIAGNOSIS — L209 Atopic dermatitis, unspecified: Secondary | ICD-10-CM

## 2023-03-20 MED ORDER — BUDESONIDE-FORMOTEROL FUMARATE 160-4.5 MCG/ACT IN AERO: INHALATION_SPRAY | RESPIRATORY_TRACT | 5 refills | Status: DC

## 2023-03-20 MED ORDER — MONTELUKAST SODIUM 10 MG PO TABS: 10.0000 mg | ORAL_TABLET | Freq: Every day | ORAL | 1 refills | Status: DC

## 2023-03-21 ENCOUNTER — Other Ambulatory Visit (HOSPITAL_COMMUNITY): Payer: Self-pay

## 2023-03-21 ENCOUNTER — Other Ambulatory Visit (HOSPITAL_COMMUNITY): Payer: Self-pay | Admitting: Pharmacy Technician

## 2023-03-21 NOTE — Progress Notes (Signed)
Specialty Pharmacy Refill Coordination Note  Colleen Baker is a 47 y.o. female contacted today regarding refills of specialty medication(s) Dupilumab   Patient requested Delivery   Delivery date: 03/28/23   Verified address: [address] [place]   Medication will be filled on 03/27/23.

## 2023-03-23 DIAGNOSIS — M5030 Other cervical disc degeneration, unspecified cervical region: Secondary | ICD-10-CM | POA: Insufficient documentation

## 2023-03-27 ENCOUNTER — Other Ambulatory Visit: Payer: Self-pay

## 2023-04-12 ENCOUNTER — Other Ambulatory Visit: Payer: Self-pay | Admitting: Sports Medicine

## 2023-04-12 DIAGNOSIS — M5030 Other cervical disc degeneration, unspecified cervical region: Secondary | ICD-10-CM

## 2023-04-12 DIAGNOSIS — M7552 Bursitis of left shoulder: Secondary | ICD-10-CM

## 2023-04-19 ENCOUNTER — Other Ambulatory Visit: Payer: Self-pay

## 2023-04-19 NOTE — Progress Notes (Signed)
 Specialty Pharmacy Refill Coordination Note  Colleen Baker is a 48 y.o. female contacted today regarding refills of specialty medication(s) Dupilumab  (Dupixent )   Patient requested Delivery   Delivery date: 05/01/23   Verified address: [address]   [place] [address]   Medication will be filled on 04/30/23.

## 2023-04-20 ENCOUNTER — Inpatient Hospital Stay (HOSPITAL_BASED_OUTPATIENT_CLINIC_OR_DEPARTMENT_OTHER)
Admission: EM | Admit: 2023-04-20 | Discharge: 2023-04-23 | DRG: 065 | Disposition: A | Discharge status: Home / Self Care | Payer: Medicaid Other | Attending: Internal Medicine | Admitting: Internal Medicine

## 2023-04-20 ENCOUNTER — Emergency Department (HOSPITAL_BASED_OUTPATIENT_CLINIC_OR_DEPARTMENT_OTHER): Payer: Medicaid Other

## 2023-04-20 ENCOUNTER — Encounter (HOSPITAL_BASED_OUTPATIENT_CLINIC_OR_DEPARTMENT_OTHER): Payer: Self-pay | Admitting: Emergency Medicine

## 2023-04-20 ENCOUNTER — Other Ambulatory Visit: Payer: Self-pay

## 2023-04-20 DIAGNOSIS — E038 Other specified hypothyroidism: Secondary | ICD-10-CM | POA: Diagnosis present

## 2023-04-20 DIAGNOSIS — K5090 Crohn's disease, unspecified, without complications: Secondary | ICD-10-CM | POA: Diagnosis present

## 2023-04-20 DIAGNOSIS — N289 Disorder of kidney and ureter, unspecified: Secondary | ICD-10-CM | POA: Diagnosis present

## 2023-04-20 DIAGNOSIS — Z888 Allergy status to other drugs, medicaments and biological substances status: Secondary | ICD-10-CM

## 2023-04-20 DIAGNOSIS — G8929 Other chronic pain: Secondary | ICD-10-CM | POA: Diagnosis present

## 2023-04-20 DIAGNOSIS — R059 Cough, unspecified: Secondary | ICD-10-CM | POA: Diagnosis present

## 2023-04-20 DIAGNOSIS — Z8249 Family history of ischemic heart disease and other diseases of the circulatory system: Secondary | ICD-10-CM

## 2023-04-20 DIAGNOSIS — M1990 Unspecified osteoarthritis, unspecified site: Secondary | ICD-10-CM | POA: Diagnosis present

## 2023-04-20 DIAGNOSIS — I10 Essential (primary) hypertension: Secondary | ICD-10-CM | POA: Diagnosis present

## 2023-04-20 DIAGNOSIS — Z91041 Radiographic dye allergy status: Secondary | ICD-10-CM

## 2023-04-20 DIAGNOSIS — R270 Ataxia, unspecified: Secondary | ICD-10-CM | POA: Diagnosis present

## 2023-04-20 DIAGNOSIS — Z841 Family history of disorders of kidney and ureter: Secondary | ICD-10-CM

## 2023-04-20 DIAGNOSIS — I161 Hypertensive emergency: Secondary | ICD-10-CM | POA: Diagnosis present

## 2023-04-20 DIAGNOSIS — K219 Gastro-esophageal reflux disease without esophagitis: Secondary | ICD-10-CM | POA: Diagnosis present

## 2023-04-20 DIAGNOSIS — R531 Weakness: Secondary | ICD-10-CM

## 2023-04-20 DIAGNOSIS — G20A1 Parkinson's disease without dyskinesia, without mention of fluctuations: Secondary | ICD-10-CM | POA: Diagnosis present

## 2023-04-20 DIAGNOSIS — R29701 NIHSS score 1: Secondary | ICD-10-CM | POA: Diagnosis present

## 2023-04-20 DIAGNOSIS — Z7951 Long term (current) use of inhaled steroids: Secondary | ICD-10-CM

## 2023-04-20 DIAGNOSIS — B974 Respiratory syncytial virus as the cause of diseases classified elsewhere: Secondary | ICD-10-CM | POA: Diagnosis present

## 2023-04-20 DIAGNOSIS — I639 Cerebral infarction, unspecified: Secondary | ICD-10-CM | POA: Diagnosis present

## 2023-04-20 DIAGNOSIS — Z825 Family history of asthma and other chronic lower respiratory diseases: Secondary | ICD-10-CM

## 2023-04-20 DIAGNOSIS — Z90710 Acquired absence of both cervix and uterus: Secondary | ICD-10-CM

## 2023-04-20 DIAGNOSIS — N390 Urinary tract infection, site not specified: Secondary | ICD-10-CM | POA: Diagnosis present

## 2023-04-20 DIAGNOSIS — Z79899 Other long term (current) drug therapy: Secondary | ICD-10-CM

## 2023-04-20 DIAGNOSIS — F32A Depression, unspecified: Secondary | ICD-10-CM | POA: Diagnosis present

## 2023-04-20 DIAGNOSIS — E876 Hypokalemia: Principal | ICD-10-CM | POA: Diagnosis present

## 2023-04-20 DIAGNOSIS — M549 Dorsalgia, unspecified: Secondary | ICD-10-CM | POA: Diagnosis present

## 2023-04-20 DIAGNOSIS — E663 Overweight: Secondary | ICD-10-CM | POA: Diagnosis present

## 2023-04-20 DIAGNOSIS — G8191 Hemiplegia, unspecified affecting right dominant side: Secondary | ICD-10-CM | POA: Diagnosis present

## 2023-04-20 DIAGNOSIS — E785 Hyperlipidemia, unspecified: Secondary | ICD-10-CM | POA: Diagnosis present

## 2023-04-20 DIAGNOSIS — N939 Abnormal uterine and vaginal bleeding, unspecified: Secondary | ICD-10-CM | POA: Diagnosis present

## 2023-04-20 DIAGNOSIS — J45909 Unspecified asthma, uncomplicated: Secondary | ICD-10-CM | POA: Diagnosis present

## 2023-04-20 DIAGNOSIS — L309 Dermatitis, unspecified: Secondary | ICD-10-CM | POA: Diagnosis present

## 2023-04-20 DIAGNOSIS — F17290 Nicotine dependence, other tobacco product, uncomplicated: Secondary | ICD-10-CM | POA: Diagnosis present

## 2023-04-20 DIAGNOSIS — I6381 Other cerebral infarction due to occlusion or stenosis of small artery: Principal | ICD-10-CM | POA: Diagnosis present

## 2023-04-20 DIAGNOSIS — E871 Hypo-osmolality and hyponatremia: Secondary | ICD-10-CM | POA: Diagnosis present

## 2023-04-20 DIAGNOSIS — D649 Anemia, unspecified: Secondary | ICD-10-CM | POA: Diagnosis present

## 2023-04-20 DIAGNOSIS — Z6829 Body mass index (BMI) 29.0-29.9, adult: Secondary | ICD-10-CM

## 2023-04-20 LAB — CBC
HCT: 35.7 % — ABNORMAL LOW (ref 36.0–46.0)
Hemoglobin: 11.6 g/dL — ABNORMAL LOW (ref 12.0–15.0)
MCH: 26.5 pg (ref 26.0–34.0)
MCHC: 32.5 g/dL (ref 30.0–36.0)
MCV: 81.5 fL (ref 80.0–100.0)
Platelets: 222 10*3/uL (ref 150–400)
RBC: 4.38 MIL/uL (ref 3.87–5.11)
RDW: 13.9 % (ref 11.5–15.5)
WBC: 9.3 10*3/uL (ref 4.0–10.5)
nRBC: 0 % (ref 0.0–0.2)

## 2023-04-20 LAB — URINALYSIS, ROUTINE W REFLEX MICROSCOPIC
Bilirubin Urine: NEGATIVE
Glucose, UA: NEGATIVE mg/dL
Ketones, ur: NEGATIVE mg/dL
Nitrite: NEGATIVE
Protein, ur: 30 mg/dL — AB
Specific Gravity, Urine: 1.02 (ref 1.005–1.030)
pH: 7 (ref 5.0–8.0)

## 2023-04-20 LAB — BASIC METABOLIC PANEL
Anion gap: 8 (ref 5–15)
BUN: 12 mg/dL (ref 6–20)
CO2: 27 mmol/L (ref 22–32)
Calcium: 8.4 mg/dL — ABNORMAL LOW (ref 8.9–10.3)
Chloride: 99 mmol/L (ref 98–111)
Creatinine, Ser: 1.14 mg/dL — ABNORMAL HIGH (ref 0.44–1.00)
GFR, Estimated: 60 mL/min — ABNORMAL LOW (ref >60)
Glucose, Bld: 125 mg/dL — ABNORMAL HIGH (ref 70–99)
Potassium: 2.7 mmol/L — CL (ref 3.5–5.1)
Sodium: 134 mmol/L — ABNORMAL LOW (ref 135–145)

## 2023-04-20 LAB — URINALYSIS, MICROSCOPIC (REFLEX)

## 2023-04-20 LAB — MAGNESIUM: Magnesium: 1.4 mg/dL — ABNORMAL LOW (ref 1.7–2.4)

## 2023-04-20 MED ORDER — MAGNESIUM SULFATE 2 GM/50ML IV SOLN
2.0000 g | Freq: Once | INTRAVENOUS | Status: AC
  Administered 2023-04-20: 2 g via INTRAVENOUS
  Filled 2023-04-20: qty 50

## 2023-04-20 MED ORDER — POTASSIUM CHLORIDE CRYS ER 20 MEQ PO TBCR
60.0000 meq | EXTENDED_RELEASE_TABLET | Freq: Once | ORAL | Status: AC
  Administered 2023-04-20: 60 meq via ORAL
  Filled 2023-04-20: qty 3

## 2023-04-20 MED ORDER — POTASSIUM CHLORIDE 10 MEQ/100ML IV SOLN
10.0000 meq | INTRAVENOUS | Status: DC
  Filled 2023-04-20: qty 100

## 2023-04-20 NOTE — ED Triage Notes (Signed)
 Reports woke up yesterday morning with right side weakness , right arm and right leg .  No face asymmetry , alert and oriented x 4 . No vision change , mild headache .Marland Kitchen Hx HTN

## 2023-04-20 NOTE — ED Triage Notes (Signed)
 Pt sent over from [facility] via POV for MRI. Pt c/o headache and still feeling mildly the same as this morning. Pt stating that she is claustrophobic and needs medication for MRI.

## 2023-04-20 NOTE — Consult Note (Signed)
 NEUROLOGY CONSULT NOTE       Date of service: April 21, 2023  Patient Name: Colleen Baker MRN:  [id number] DOB:  August 06, 1975 Chief Complaint: Right sided weakness Requesting Provider: Trine Raynell Moder, *  History of Present Illness  Colleen Baker is a 48 y.o. female  has a past medical history of Anemia, Anxiety, Arthritis, Asthma with allergic rhinitis, Chronic back pain, Crohn's disease in remission (HCC), Depression, Eczema, GERD (gastroesophageal reflux disease), Headache, Heart murmur, History of abnormal cervical Pap smear, History of kidney stones, Hypertension, Irritable bowel, Pinched nerve in neck, and Uterine fibroid.     She reports she was in her usual state of health when she went to bed Wednesday evening (early Thursday morning).  She noted when she went to use the bathroom in the middle of the night that she was having some trouble walking but was too sleepy to think much of it.  When she woke she realized she was stably weak on the right side which has not changed since onset.  MRI brain revealed acute stroke and neurology was consulted for further recommendations  On review of systems she additionally notes some slight vaginal spotting (2 episodes over the past 1 week) which she notes is unusual given her history of hysterectomy.  She also notes 1 year of palpitations and 2-3 pillow orthopnea that has been stable.  This year she was also developed some nausea in the mornings unless she takes her medications right after eating.  Denies any history of recent bleeding secondary to Crohn's disease.  Nor any history of significant bleeding requiring hospitalization in the past  She does smoke approximately 1 cigar/month, denies other substance use  She reports her contrast allergy is quite mild which is some hives but she has not had any contrasted scans since her dye allergy was discovered  She reports she is tolerating her home rosuvastatin  10 mg daily well   LKW: 1 AM on  04/18/2022 Modified rankin score: 2-Slight disability-UNABLE to perform all activities but does not need assistance (out of work due to a left arm injury) IV Thrombolysis: No, out of the window  EVT: No, exam not c/w LVO   NIHSS components Score: Comment  1a Level of Conscious 0[x]  1[]  2[]  3[]      1b LOC Questions 0[x]  1[]  2[]       1c LOC Commands 0[x]  1[]  2[]       2 Best Gaze 0[x]  1[]  2[]       3 Visual 0[x]  1[]  2[]  3[]      4 Facial Palsy 0[x]  1[]  2[]  3[]      5a Motor Arm - left 0[x]  1[]  2[]  3[]  4[]  UN[]    5b Motor Arm - Right 0[]  1[x]  2[]  3[]  4[]  UN[]    6a Motor Leg - Left 0[x]  1[]  2[]  3[]  4[]  UN[]    6b Motor Leg - Right 0[x]  1[]  2[]  3[]  4[]  UN[]    7 Limb Ataxia 0[]  1[]  2[x]  3[]  UN[]   Right arm and leg, out of proportion to weakness  8 Sensory 0[x]  1[]  2[]  UN[]      9 Best Language 0[x]  1[]  2[]  3[]      10 Dysarthria 0[x]  1[]  2[]  UN[]      11 Extinct. and Inattention 0[x]  1[]  2[]       TOTAL:       ROS  Comprehensive ROS performed and pertinent positives documented in HPI    Past History   Past Medical History:  Diagnosis Date   Anemia    allergy induced  Anxiety    Arthritis    Asthma with allergic rhinitis    Chronic back pain    Crohn's disease in remission (HCC)    Depression    Eczema    GERD (gastroesophageal reflux disease)    Headache    Heart murmur    asymptomatic    History of abnormal cervical Pap smear    History of kidney stones    Hypertension    Irritable bowel    Pinched nerve in neck    Uterine fibroid     Past Surgical History:  Procedure Laterality Date   ABDOMINAL HYSTERECTOMY     BUNIONECTOMY Left    CESAREAN SECTION  08/31/2011   Procedure: CESAREAN SECTION;  Surgeon: Marjorie DEL. Okey, MD;  Location: [facility];  Service: Gynecology;  Laterality: N/A;   COLONOSCOPY  last one 2015   CYSTOSCOPY  11/06/2018   Procedure: CYSTOSCOPY;  Surgeon: Sarrah Browning, MD;  Location: [facility];  Service: Gynecology;;   DILATATION &  CURETTAGE/HYSTEROSCOPY WITH MYOSURE N/A 01/13/2015   Procedure: DILATATION & CURETTAGE/HYSTEROSCOPY WITH  MYOSURE;  Surgeon: Marjorie Okey, MD;  Location: [facility];  Service: Gynecology;  Laterality: N/A;   ganglion cyst removed Right    hiatal hernia repair  as child   LEEP  1990's   MENISCUS REPAIR Right 6 yrs ago   MYOMECTOMY ABDOMINAL APPROACH  02/15/2005   NORPLANT  REMOVAL Left 11/06/2018   Procedure: REMOVAL OF NEXPLANON;  Surgeon: Sarrah Browning, MD;  Location: [facility];  Service: Gynecology;  Laterality: Left;   ROBOT ASSISTED MYOMECTOMY  06/2010   ROBOTIC ASSISTED LAPAROSCOPIC HYSTERECTOMY AND SALPINGECTOMY Bilateral 11/06/2018   Procedure: XI ROBOTIC ASSISTED LAPAROSCOPIC HYSTERECTOMY WITH BILATERAL SALPINGECTOMY;  Surgeon: Sarrah Browning, MD;  Location: [facility];  Service: Gynecology;  Laterality: Bilateral;   SHOULDER SURGERY      Family History: Family History  Problem Relation Age of Onset   Hypertension Mother    Asthma Mother    Bronchitis Mother    Kidney disease Father    Asthma Father    Eczema Sister    Sinusitis Sister    Asthma Maternal Grandmother    Eczema Maternal Grandmother    Bronchitis Maternal Grandmother    Allergic rhinitis Son    Eczema Son    Urticaria Neg Hx    Immunodeficiency Neg Hx    Angioedema Neg Hx     Social History  reports that she has never smoked. She has been exposed to tobacco smoke. She has never used smokeless tobacco. She reports current alcohol use. She reports that she does not use drugs.  Allergies  Allergen Reactions   Aciphex [Rabeprazole Sodium] Hives   Lisinopril Swelling    Facial    Iodinated Contrast Media Hives and Itching    Contrast dye    Medications   Current Facility-Administered Medications:    diazepam  (VALIUM ) injection 2.5 mg, 2.5 mg, Intravenous, Once, Cardama, Raynell Moder, MD   diazepam  (VALIUM ) injection 2.5 mg, 2.5 mg, Intravenous,  Once, Groce, Christopher F, PA-C  Current Outpatient Medications:    albuterol  (VENTOLIN  HFA) 108 (90 Base) MCG/ACT inhaler, INHALE 2 PUFFS INTO THE LUNGS EVERY 4 HOURS AS NEEDED FOR WHEEING OR SHORTNESS OF BREATH, Disp: 6.7 g, Rfl: 1   amLODipine  (NORVASC ) 10 MG tablet, TAKE ONE-HALF TABLET BY MOUTH DAILY FOR 3 DAYS. INCREASE TO 1 TABLET BY MOUTH DAILY IF TOLERANT, Disp: , Rfl:    azelastine  (ASTELIN ) 0.1 %  nasal spray, Place 1-2 sprays into both nostrils 2 (two) times daily as needed., Disp: 30 mL, Rfl: 5   budesonide -formoterol  (SYMBICORT ) 160-4.5 MCG/ACT inhaler, Take 2 puffs 3-7 days a week for control of asthma symptoms, Disp: 1 each, Rfl: 5   DULoxetine  (CYMBALTA ) 60 MG capsule, TAKE ONE CAPSULE BY MOUTH DAILY, Disp: , Rfl:    dupilumab  (DUPIXENT ) 300 MG/2ML prefilled syringe, Inject 600 mg into the skin once for 1 dose. Then 300mg  every 14 days, Disp: 4 mL, Rfl: 11   escitalopram  (LEXAPRO ) 10 MG tablet, Take 1/2 tablet daily for one week then take 1 tablet daily (Patient not taking: Reported on 03/20/2023), Disp: , Rfl:    eszopiclone (LUNESTA) 2 MG TABS tablet, Take 2 tablets  immediately before bedtime (Patient not taking: Reported on 03/20/2023), Disp: , Rfl:    Eszopiclone 3 MG TABS, Take 3 mg by mouth at bedtime. (Patient not taking: Reported on 10/25/2022), Disp: , Rfl:    famotidine  (PEPCID ) 20 MG tablet, Take 1 tablet (20 mg total) by mouth daily., Disp: 30 tablet, Rfl: 3   flunisolide  (NASALIDE ) 25 MCG/ACT (0.025%) SOLN, 2 sprays each nostril once a day as needed for stuffy nose. (Patient not taking: Reported on 03/20/2023), Disp: 25 mL, Rfl: 3   fluticasone  (FLONASE ) 50 MCG/ACT nasal spray, Place 2 sprays into both nostrils daily as needed for allergies or rhinitis., Disp: 16 g, Rfl: 4   hydrochlorothiazide (HYDRODIURIL) 25 MG tablet, TAKE ONE TABLET BY MOUTH DAILY, Disp: , Rfl:    hydrocortisone  2.5 % ointment, Apply topically twice daily as need to red sandpapery rash., Disp: 30 g,  Rfl: 0   levocetirizine (XYZAL ) 5 MG tablet, Take up to 2 tablets twice daily as needed for hives and swelling, Disp: 120 tablet, Rfl: 5   mesalamine  (LIALDA ) 1.2 g EC tablet, Take 2.4 g by mouth at bedtime.  (Patient not taking: Reported on 03/20/2023), Disp: , Rfl:    metoprolol  succinate (TOPROL -XL) 100 MG 24 hr tablet, Take 100 mg by mouth at bedtime. Take with or immediately following a meal., Disp: , Rfl:    montelukast  (SINGULAIR ) 10 MG tablet, Take 1 tablet (10 mg total) by mouth at bedtime., Disp: 90 tablet, Rfl: 1   potassium chloride  (K-DUR) 10 MEQ tablet, Take 10 mEq by mouth daily., Disp: , Rfl:    tacrolimus  (PROTOPIC ) 0.1 % ointment, Use topically twice daily as needed for red, sandpapery rash, Disp: 100 g, Rfl: 3   triamcinolone  cream (KENALOG ) 0.1 %, Apply 1 Application topically 2 (two) times daily as needed (itching). Do not use on face, groin or arm pits., Disp: 453 g, Rfl: 3  Vitals   Vitals:   04/21/23 0100 04/21/23 0115 04/21/23 0130 04/21/23 0145  BP: (!) 178/116 (!) 185/112 (!) 181/110 (!) 180/106  Pulse: 88 83 85 88  Resp: 17 14 18  (!) 22  Temp:      TempSrc:      SpO2: 100% 100% 100% 100%  Weight:      Height:        Body mass index is 29.41 kg/m.  Physical Exam   Constitutional: Appears well-developed and well-nourished.  Psych: Affect appropriate to situation.  Eyes: No scleral injection.  HENT: No OP obstruction.  Head: Normocephalic.  Cardiovascular: Normal rate and regular rhythm.  Respiratory: Effort normal, non-labored breathing.  GI: Soft.  No distension. There is no tenderness.    Neurologic Examination   Physical Exam  Constitutional: Appears well-developed and well-nourished.  Psych: Affect appropriate to situation Eyes: No scleral injection HENT: No OP obstrucion MSK: no joint deformities.  Cardiovascular: Normal rate and regular rhythm.  Respiratory: Effort normal, non-labored breathing GI: Soft.  No distension. There is no  tenderness.  Skin: WDI  Neuro: Mental Status: Patient is awake, alert, oriented to person, place, month, year, and situation. Patient is able to give a clear and coherent history. No signs of aphasia or neglect Cranial Nerves: II: Visual Fields are full. Pupils are equal, round, and reactive to light.   III,IV, VI: EOMI without ptosis or diploplia.  V: Facial sensation is symmetric to temperature VII: Facial movement is symmetric.  VIII: hearing is intact to voice X: Uvula elevates symmetrically XI: Shoulder shrug is symmetric. XII: tongue is midline without atrophy or fasciculations.  Motor: Tone is normal. Bulk is normal. 5/5 strength was present in all four extremities except for 4/5 LUE and 4/5 hip flexion on the left Sensory: Sensation is symmetric to light touch and temperature in the arms and legs. Deep Tendon Reflexes: 1+ and symmetric in the bachioradilais (with distraction only) and 2+ patellae.  Plantars: Toes are downgoing bilaterally.  Cerebellar: FNF and HKS are intact bilaterally    Labs/Imaging/Neurodiagnostic studies   CBC:  Recent Labs  Lab 29-Apr-2023 1448  WBC 9.3  HGB 11.6*  HCT 35.7*  MCV 81.5  PLT 222   Basic Metabolic Panel:  Lab Results  Component Value Date   NA 134 (L) Apr 29, 2023   K 2.7 (LL) 2023-04-29   CO2 27 29-Apr-2023   GLUCOSE 125 (H) Apr 29, 2023   BUN 12 29-Apr-2023   CREATININE 1.14 (H) Apr 29, 2023   CALCIUM  8.4 (L) 04/29/2023   GFRNONAA 60 (L) 04-29-23   GFRAA >60 11/07/2018   Lipid Panel: No results found for: LDLCALC HgbA1c: No results found for: HGBA1C Urine Drug Screen: No results found for: LABOPIA, COCAINSCRNUR, LABBENZ, AMPHETMU, THCU, LABBARB  Alcohol Level No results found for: ETH INR No results found for: INR APTT No results found for: APTT AED levels: No results found for: PHENYTOIN, ZONISAMIDE , LAMOTRIGINE, LEVETIRACETA  CT Head without contrast(Personally reviewed): 1. No  evidence of an acute cortically based infarct or intracranial hemorrhage. 2. Mild cerebral white matter disease, abnormal for age and nonspecific though could reflect chronic small vessel ischemia or demyelinating disease. A recent lacunar infarct is not excluded in the left corona radiata. Consider MRI for further evaluation.  CT angio Head and Neck with contrast: Pending, after premedication  MRI Brain(Personally reviewed): 12 mm lesion in the left corona radiata with restricted diffusion, favor acute infarct over demyelinating plaque. Chronic white matter disease is mild with nonspecific pattern.  MRI C-spine Normal MRI of the cervical cord.   ASSESSMENT   Sholanda Gangi is a 48 y.o. right-handed woman presenting with weakness and ataxia secondary to left corona radiata stroke  RECOMMENDATIONS  # Left corona radiata stroke - Stroke labs TSH, ESR, CRP, HIV, RPR, HgbA1c, fasting lipid panel, hypercoag panel (except for ATIII, should be sent if homocysteine results high and Factor V Lieden, unlikely in this demographic), ANA w/ reflex  - CTA with premedication preferred by patient to MRA (she notes only a mild contrast allergy with no effect on her breathing)  - Frequent neuro checks - Echocardiogram - TCD - Prophylactic therapy-Antiplatelet meds ASA 81 mg daily and Plavix  75 mg daily (avoiding loading dose due to Chron's); DAPT course TBD pending vessel imaging - Risk factor modification - Telemetry monitoring; 30 day event monitor on  discharge if no arrythmias captured  - Blood pressure goal   - Normotension, to be achieved gradually (out of the permissive hypertension window) - PT consult, OT consult, Speech consult - Stroke team to follow  #Eczema -Case reports of Dupixent  medication potentially increasing risk of stroke, patient to discuss with dermatology on outpatient follow-up  #Crohn's disease -Well-controlled per patient  #Abnormal uterine breathing -Workup per  primary team, if concern for potential underlying malignancy would expedite workup as hypercoagulability of malignancy should then be considered as a mechanism of stroke ______________________________________________________________________   Lola Jernigan MD-PhD [facility] [phone] Available 7 PM to 7 AM, outside of these hours please call Neurologist on call as listed on Amion.

## 2023-04-20 NOTE — ED Provider Notes (Signed)
 [facility] Provider Note   CSN: [id number] Arrival date & time: 04/20/23  1426     History Chief Complaint  Patient presents with   Weakness    Colleen Baker is a 48 y.o. female.  Patient with past history significant for asthma, GERD presents the emergency department today with concerns of weakness.  Reports that she woke up yesterday morning with right sided upper extremity and lower extremity weakness.  Felt that the area felt like noodles.  She denied any alteration to sensation.  Does not recall experiencing any sort of facial droop, slurred speech, or transient alteration in cognitive state.  Patient has no history of any neurological disorders as far she is aware.  She is not on any blood thinners or aspirin .   Weakness      Home Medications Prior to Admission medications   Medication Sig Start Date End Date Taking? Authorizing Provider  albuterol  (VENTOLIN  HFA) 108 (90 Base) MCG/ACT inhaler INHALE 2 PUFFS INTO THE LUNGS EVERY 4 HOURS AS NEEDED FOR [facility] OR SHORTNESS OF BREATH 12/25/22   Marinda Rocky SAILOR, MD  amLODipine  (NORVASC ) 10 MG tablet TAKE ONE-HALF TABLET BY MOUTH DAILY FOR 3 DAYS. INCREASE TO 1 TABLET BY MOUTH DAILY IF TOLERANT 12/06/18   [provider]  azelastine  (ASTELIN ) 0.1 % nasal spray Place 1-2 sprays into both nostrils 2 (two) times daily as needed. 10/25/22   Marinda Rocky SAILOR, MD  budesonide -formoterol  (SYMBICORT ) 160-4.5 MCG/ACT inhaler Take 2 puffs 3-7 days a week for control of asthma symptoms 03/20/23   Ambs, Arlean HERO, FNP  DULoxetine  (CYMBALTA ) 60 MG capsule TAKE ONE CAPSULE BY MOUTH DAILY 03/17/19   [provider]  dupilumab  (DUPIXENT ) 300 MG/2ML prefilled syringe Inject 600 mg into the skin once for 1 dose. Then 300mg  every 14 days 12/06/22 05/23/23  Marinda Rocky SAILOR, MD  escitalopram  (LEXAPRO ) 10 MG tablet Take 1/2 tablet daily for one week then take 1 tablet daily Patient not taking: Reported on  03/20/2023 02/14/19   [provider]  eszopiclone (LUNESTA) 2 MG TABS tablet Take 2 tablets  immediately before bedtime Patient not taking: Reported on 03/20/2023 11/27/19   [provider]  Eszopiclone 3 MG TABS Take 3 mg by mouth at bedtime. Patient not taking: Reported on 10/25/2022 10/05/19   [provider]  famotidine  (PEPCID ) 20 MG tablet Take 1 tablet (20 mg total) by mouth daily. 01/31/21   Cheryl Reusing, FNP  flunisolide  (NASALIDE ) 25 MCG/ACT (0.025%) SOLN 2 sprays each nostril once a day as needed for stuffy nose. Patient not taking: Reported on 03/20/2023 09/22/20   Cheryl Reusing, FNP  fluticasone  (FLONASE ) 50 MCG/ACT nasal spray Place 2 sprays into both nostrils daily as needed for allergies or rhinitis. 01/16/23   Ambs, Arlean HERO, FNP  hydrochlorothiazide (HYDRODIURIL) 25 MG tablet TAKE ONE TABLET BY MOUTH DAILY 12/06/18   [provider]  hydrocortisone  2.5 % ointment Apply topically twice daily as need to red sandpapery rash. 10/25/22   Marinda Rocky SAILOR, MD  levocetirizine (XYZAL ) 5 MG tablet Take up to 2 tablets twice daily as needed for hives and swelling 12/06/22   Marinda Rocky SAILOR, MD  mesalamine  (LIALDA ) 1.2 g EC tablet Take 2.4 g by mouth at bedtime.  Patient not taking: Reported on 03/20/2023    [provider]  metoprolol  succinate (TOPROL -XL) 100 MG 24 hr tablet Take 100 mg by mouth at bedtime. Take with or immediately following a meal.  [provider]  montelukast  (SINGULAIR ) 10 MG tablet Take 1 tablet (10 mg total) by mouth at bedtime. 03/20/23   Cari Arlean HERO, FNP  potassium chloride  (K-DUR) 10 MEQ tablet Take 10 mEq by mouth daily.    [provider]  tacrolimus  (PROTOPIC ) 0.1 % ointment Use topically twice daily as needed for red, sandpapery rash 10/25/22   Marinda Rocky SAILOR, MD  triamcinolone  cream (KENALOG ) 0.1 % Apply 1 Application topically 2 (two) times daily as needed (itching). Do not use on face, groin or arm pits.  10/25/22   Marinda Rocky SAILOR, MD      Allergies    Aciphex [rabeprazole sodium], Lisinopril, and Iodinated contrast media    Review of Systems   Review of Systems  Neurological:  Positive for weakness.  All other systems reviewed and are negative.   Physical Exam Updated Vital Signs BP (!) 200/127   Pulse 86   Temp 99.1 F (37.3 C)   Resp 13   Ht 5' 10 (1.778 m)   Wt 93 kg   LMP 10/31/2018   SpO2 100%   BMI 29.41 kg/m  Physical Exam Vitals and nursing note reviewed.  Constitutional:      General: She is not in acute distress.    Appearance: Normal appearance. She is well-developed. She is not ill-appearing.  HENT:     Head: Normocephalic and atraumatic.  Eyes:     Conjunctiva/sclera: Conjunctivae normal.  Cardiovascular:     Rate and Rhythm: Normal rate and regular rhythm.     Heart sounds: No murmur heard. Pulmonary:     Effort: Pulmonary effort is normal. No respiratory distress.     Breath sounds: Normal breath sounds.  Abdominal:     Palpations: Abdomen is soft.     Tenderness: There is no abdominal tenderness.  Musculoskeletal:        General: No swelling.     Cervical back: Neck supple.  Skin:    General: Skin is warm and dry.     Capillary Refill: Capillary refill takes less than 2 seconds.  Neurological:     Mental Status: She is alert and oriented to person, place, and time.     Sensory: No sensory deficit.     Motor: Weakness present.     Comments: Strength: RUE 4/5, LUE 5/5 RLE 4/5, LLE 5/5  No facial droop, asymmetry, or slurred speech. Sensation reportedly at baseline.  Psychiatric:        Mood and Affect: Mood normal.     ED Results / Procedures / Treatments   Labs (all labs ordered are listed, but only abnormal results are displayed) Labs Reviewed  BASIC METABOLIC PANEL - Abnormal; Notable for the following components:      Result Value   Sodium 134 (*)    Potassium 2.7 (*)    Glucose, Bld 125 (*)    Creatinine, Ser 1.14 (*)     Calcium  8.4 (*)    GFR, Estimated 60 (*)    All other components within normal limits  CBC - Abnormal; Notable for the following components:   Hemoglobin 11.6 (*)    HCT 35.7 (*)    All other components within normal limits  URINALYSIS, ROUTINE W REFLEX MICROSCOPIC - Abnormal; Notable for the following components:   APPearance HAZY (*)    Hgb urine dipstick TRACE (*)    Protein, ur 30 (*)    Leukocytes,Ua MODERATE (*)    All other components within normal limits  MAGNESIUM  - Abnormal; Notable for the following components:   Magnesium  1.4 (*)    All other components within normal limits  URINALYSIS, MICROSCOPIC (REFLEX) - Abnormal; Notable for the following components:   Bacteria, UA FEW (*)    All other components within normal limits    EKG EKG Interpretation Date/Time:  Friday April 20 2023 14:39:27 EST Ventricular Rate:  91 PR Interval:  138 QRS Duration:  82 QT Interval:  373 QTC Calculation: 459 R Axis:   -34  Text Interpretation: Sinus rhythm Left axis deviation Abnormal R-wave progression, late transition Abnormal T, consider ischemia, diffuse leads No significant change since last tracing Confirmed by Francesca Fallow ([id number]) on 04/20/2023 5:45:48 PM  Radiology CT Head Wo Contrast Result Date: 04/20/2023 CLINICAL DATA:  Neuro deficit, acute, stroke suspected. Right arm and leg weakness. EXAM: CT HEAD WITHOUT CONTRAST TECHNIQUE: Contiguous axial images were obtained from the base of the skull through the vertex without intravenous contrast. RADIATION DOSE REDUCTION: This exam was performed according to the departmental dose-optimization program which includes automated exposure control, adjustment of the mA and/or kV according to patient size and/or use of iterative reconstruction technique. COMPARISON:  None Available. FINDINGS: Brain: There is no evidence of an acute cortically based infarct, intracranial hemorrhage, mass, midline shift, or extra-axial fluid collection.  Cerebral volume is normal with normal size of the ventricles. Scattered hypodensities in the cerebral white matter bilaterally are mild in severity but abnormal for age. There is a small focus of slightly more discrete hypodensity in the left corona radiata raising the possibility of a recent lacunar infarct. Vascular: No hyperdense vessel. Skull: No acute fracture or suspicious osseous lesion. Sinuses/Orbits: Mild mucosal thickening in the paranasal sinuses. Clear mastoid air cells. Unremarkable orbits. Other: None. IMPRESSION: 1. No evidence of an acute cortically based infarct or intracranial hemorrhage. 2. Mild cerebral white matter disease, abnormal for age and nonspecific though could reflect chronic small vessel ischemia or demyelinating disease. A recent lacunar infarct is not excluded in the left corona radiata. Consider MRI for further evaluation. Electronically Signed   By: Dasie Hamburg M.D.   On: 04/20/2023 16:39    Procedures Procedures   Medications Ordered in ED Medications  magnesium  sulfate IVPB 2 g 50 mL (2 g Intravenous New Bag/Given 04/20/23 2033)  potassium chloride  SA (KLOR-CON  M) CR tablet 60 mEq (60 mEq Oral Given 04/20/23 2041)    ED Course/ Medical Decision Making/ A&P                                 Medical Decision Making Amount and/or Complexity of Data Reviewed Labs: ordered. Radiology: ordered.  Risk Prescription drug management.   This patient presents to the ED for concern of weakness.  Differential diagnosis includes stroke, MS, dehydration, electrolyte abnormalities   Lab Tests:  I Ordered, and personally interpreted labs.  The pertinent results include: CBC with hemoglobin at 11.6, BMP with concerns for dehydration as sodium is 134, potassium of 2.7, creatinine elevated at 1.14 and GFR at 60, magnesium  slightly decreased at 1.4, UA with possible concerns for UTI with bacteria, leukocytes trace hemoglobin seen   Imaging Studies ordered:  I ordered  imaging studies including CT head I independently visualized and interpreted imaging which showed No evidence of an acute cortically based infarct or intracranial hemorrhage. 2. Mild cerebral white matter disease, abnormal for age and nonspecific though could reflect chronic small vessel ischemia or  demyelinating disease. A recent lacunar infarct is not excluded in the left corona radiata. Consider MRI for further evaluation. I agree with the radiologist interpretation   Medicines ordered and prescription drug management:  I ordered medication including potassium, magnesium  for hypokalemia, hypomagnesemia Reevaluation of the patient after these medicines showed that the patient improved I have reviewed the patients home medicines and have made adjustments as needed   Problem List / ED Course:  Patient presents the emergency department concerns of weakness.  She woke up yesterday morning with notable right-sided weakness and felt some difficulty with coordinated movement of the right side.  Was able to ambulate into the bathroom use the bathroom without difficulty although she felt like she had some altered sensation at that time.  She denies any slurred speech or facial droop that she had noted.  No prior history of stroke.  Not on any blood thinners.  Does report that she has a history of hypertension and has not taken her medications today. Given concerning findings on CT head which shows mild cerebral white matter disease which is abnormal for age which could be a nonspecific finding of chronic small vessel ischemia or demyelinating disease.  Unable to exclude a lacunar infarct in the left corona radiata.  This finding necessitates neurology consultation for further management.  Anticipate patient will likely require admission for stroke workup given that MRI imaging is unavailable here at this ED location. Spoke with Dr. Jerrie, neurology, who advised patient does require MRI imaging for  assessment of the CT head findings. Given delays in transport via [facility], will transfer ED to ED POV for faster access to MRI. Patient in agreement with this plan Dr. Yolande, ED attending, is accepting physician. IV secured prior to transport. Patient otherwise stable for POV transport at this time for completion of workup.  Final Clinical Impression(s) / ED Diagnoses Final diagnoses:  Hypokalemia  Hypomagnesemia  Right sided weakness    Rx / DC Orders ED Discharge Orders     None         Colleen Baker 04/20/23 2103    Francesca Elsie CROME, MD 04/21/23 [phone]

## 2023-04-20 NOTE — Discharge Instructions (Addendum)
 Resume your home blood pressure medications. Monitor your BP at home to gradually normalize the BP in 2-3 days. Please notify your PCP for any blood pressure medication adjustments that may be needed  =======================================================================  Take aspirin  81 mg daily and clopidogrel  75 mg daily for 3 weeks and then ASA alone afterwards

## 2023-04-20 NOTE — ED Notes (Signed)
 ED Provider at bedside.

## 2023-04-21 ENCOUNTER — Inpatient Hospital Stay (HOSPITAL_COMMUNITY): Payer: Medicaid Other

## 2023-04-21 ENCOUNTER — Emergency Department (HOSPITAL_COMMUNITY): Payer: Medicaid Other

## 2023-04-21 ENCOUNTER — Encounter (HOSPITAL_COMMUNITY): Payer: Self-pay | Admitting: Internal Medicine

## 2023-04-21 DIAGNOSIS — E871 Hypo-osmolality and hyponatremia: Secondary | ICD-10-CM | POA: Diagnosis present

## 2023-04-21 DIAGNOSIS — I10 Essential (primary) hypertension: Secondary | ICD-10-CM | POA: Diagnosis present

## 2023-04-21 DIAGNOSIS — D649 Anemia, unspecified: Secondary | ICD-10-CM | POA: Diagnosis present

## 2023-04-21 DIAGNOSIS — K5090 Crohn's disease, unspecified, without complications: Secondary | ICD-10-CM | POA: Diagnosis present

## 2023-04-21 DIAGNOSIS — E663 Overweight: Secondary | ICD-10-CM | POA: Diagnosis present

## 2023-04-21 DIAGNOSIS — I6389 Other cerebral infarction: Secondary | ICD-10-CM

## 2023-04-21 DIAGNOSIS — J45909 Unspecified asthma, uncomplicated: Secondary | ICD-10-CM | POA: Diagnosis present

## 2023-04-21 DIAGNOSIS — N939 Abnormal uterine and vaginal bleeding, unspecified: Secondary | ICD-10-CM | POA: Diagnosis not present

## 2023-04-21 DIAGNOSIS — K219 Gastro-esophageal reflux disease without esophagitis: Secondary | ICD-10-CM | POA: Diagnosis present

## 2023-04-21 DIAGNOSIS — E876 Hypokalemia: Principal | ICD-10-CM | POA: Diagnosis present

## 2023-04-21 DIAGNOSIS — K50919 Crohn's disease, unspecified, with unspecified complications: Secondary | ICD-10-CM

## 2023-04-21 DIAGNOSIS — F17290 Nicotine dependence, other tobacco product, uncomplicated: Secondary | ICD-10-CM | POA: Diagnosis present

## 2023-04-21 DIAGNOSIS — I639 Cerebral infarction, unspecified: Secondary | ICD-10-CM | POA: Diagnosis present

## 2023-04-21 DIAGNOSIS — N390 Urinary tract infection, site not specified: Secondary | ICD-10-CM | POA: Diagnosis present

## 2023-04-21 DIAGNOSIS — R059 Cough, unspecified: Secondary | ICD-10-CM | POA: Diagnosis present

## 2023-04-21 DIAGNOSIS — G8191 Hemiplegia, unspecified affecting right dominant side: Secondary | ICD-10-CM | POA: Diagnosis present

## 2023-04-21 DIAGNOSIS — F32A Depression, unspecified: Secondary | ICD-10-CM | POA: Diagnosis present

## 2023-04-21 DIAGNOSIS — E785 Hyperlipidemia, unspecified: Secondary | ICD-10-CM | POA: Diagnosis present

## 2023-04-21 DIAGNOSIS — G20A1 Parkinson's disease without dyskinesia, without mention of fluctuations: Secondary | ICD-10-CM | POA: Diagnosis present

## 2023-04-21 DIAGNOSIS — L309 Dermatitis, unspecified: Secondary | ICD-10-CM | POA: Diagnosis present

## 2023-04-21 DIAGNOSIS — N289 Disorder of kidney and ureter, unspecified: Secondary | ICD-10-CM

## 2023-04-21 DIAGNOSIS — N3001 Acute cystitis with hematuria: Secondary | ICD-10-CM | POA: Diagnosis not present

## 2023-04-21 DIAGNOSIS — R29701 NIHSS score 1: Secondary | ICD-10-CM | POA: Diagnosis present

## 2023-04-21 DIAGNOSIS — I161 Hypertensive emergency: Secondary | ICD-10-CM | POA: Diagnosis present

## 2023-04-21 DIAGNOSIS — Z6829 Body mass index (BMI) 29.0-29.9, adult: Secondary | ICD-10-CM | POA: Diagnosis not present

## 2023-04-21 DIAGNOSIS — E038 Other specified hypothyroidism: Secondary | ICD-10-CM | POA: Diagnosis present

## 2023-04-21 DIAGNOSIS — I6381 Other cerebral infarction due to occlusion or stenosis of small artery: Secondary | ICD-10-CM | POA: Diagnosis present

## 2023-04-21 DIAGNOSIS — G8929 Other chronic pain: Secondary | ICD-10-CM | POA: Diagnosis present

## 2023-04-21 DIAGNOSIS — M1990 Unspecified osteoarthritis, unspecified site: Secondary | ICD-10-CM | POA: Diagnosis present

## 2023-04-21 DIAGNOSIS — M549 Dorsalgia, unspecified: Secondary | ICD-10-CM | POA: Diagnosis present

## 2023-04-21 DIAGNOSIS — B974 Respiratory syncytial virus as the cause of diseases classified elsewhere: Secondary | ICD-10-CM | POA: Diagnosis present

## 2023-04-21 DIAGNOSIS — R051 Acute cough: Secondary | ICD-10-CM

## 2023-04-21 LAB — RAPID URINE DRUG SCREEN, HOSP PERFORMED
Amphetamines: NOT DETECTED
Barbiturates: NOT DETECTED
Benzodiazepines: POSITIVE — AB
Cocaine: NOT DETECTED
Opiates: NOT DETECTED
Tetrahydrocannabinol: NOT DETECTED

## 2023-04-21 LAB — BASIC METABOLIC PANEL
Anion gap: 11 (ref 5–15)
BUN: 6 mg/dL (ref 6–20)
CO2: 24 mmol/L (ref 22–32)
Calcium: 8.2 mg/dL — ABNORMAL LOW (ref 8.9–10.3)
Chloride: 99 mmol/L (ref 98–111)
Creatinine, Ser: 1.04 mg/dL — ABNORMAL HIGH (ref 0.44–1.00)
GFR, Estimated: >60 mL/min (ref >60)
Glucose, Bld: 114 mg/dL — ABNORMAL HIGH (ref 70–99)
Potassium: 3.1 mmol/L — ABNORMAL LOW (ref 3.5–5.1)
Sodium: 134 mmol/L — ABNORMAL LOW (ref 135–145)

## 2023-04-21 LAB — URINALYSIS, ROUTINE W REFLEX MICROSCOPIC
Bacteria, UA: NONE SEEN
Bilirubin Urine: NEGATIVE
Glucose, UA: NEGATIVE mg/dL
Hgb urine dipstick: NEGATIVE
Ketones, ur: NEGATIVE mg/dL
Leukocytes,Ua: NEGATIVE
Nitrite: NEGATIVE
Protein, ur: 30 mg/dL — AB
Specific Gravity, Urine: >1.046 — ABNORMAL HIGH (ref 1.005–1.030)
pH: 6 (ref 5.0–8.0)

## 2023-04-21 LAB — RESP PANEL BY RT-PCR (RSV, FLU A&B, COVID)  RVPGX2
Influenza A by PCR: NEGATIVE
Influenza B by PCR: NEGATIVE
Resp Syncytial Virus by PCR: POSITIVE — AB
SARS Coronavirus 2 by RT PCR: NEGATIVE

## 2023-04-21 LAB — ECHOCARDIOGRAM COMPLETE
AR max vel: 2.51 cm2
AV Peak grad: 9.9 mm[Hg]
Ao pk vel: 1.57 m/s
Area-P 1/2: 3.91 cm2
Est EF: >75
Height: 70 in
S' Lateral: 2.6 cm
Weight: 3279.99 [oz_av]

## 2023-04-21 LAB — HIV ANTIBODY (ROUTINE TESTING W REFLEX): HIV Screen 4th Generation wRfx: NONREACTIVE

## 2023-04-21 LAB — SEDIMENTATION RATE: Sed Rate: 34 mm/h — ABNORMAL HIGH (ref 0–22)

## 2023-04-21 LAB — T4, FREE: Free T4: 1.05 ng/dL (ref 0.61–1.12)

## 2023-04-21 LAB — LIPID PANEL
Cholesterol: 208 mg/dL — ABNORMAL HIGH (ref 0–200)
HDL: 55 mg/dL (ref >40)
LDL Cholesterol: 130 mg/dL — ABNORMAL HIGH (ref 0–99)
Total CHOL/HDL Ratio: 3.8 {ratio}
Triglycerides: 117 mg/dL (ref <150)
VLDL: 23 mg/dL (ref 0–40)

## 2023-04-21 LAB — SYPHILIS: RPR W/REFLEX TO RPR TITER AND TREPONEMAL ANTIBODIES, TRADITIONAL SCREENING AND DIAGNOSIS ALGORITHM: RPR Ser Ql: NONREACTIVE

## 2023-04-21 LAB — PROCALCITONIN: Procalcitonin: <0.1 ng/mL

## 2023-04-21 LAB — TSH: TSH: 7.148 u[IU]/mL — ABNORMAL HIGH (ref 0.350–4.500)

## 2023-04-21 LAB — C-REACTIVE PROTEIN: CRP: 1.8 mg/dL — ABNORMAL HIGH (ref <1.0)

## 2023-04-21 MED ORDER — SODIUM CHLORIDE 0.9% FLUSH
3.0000 mL | Freq: Two times a day (BID) | INTRAVENOUS | Status: DC
  Administered 2023-04-21 – 2023-04-23 (×4): 3 mL via INTRAVENOUS

## 2023-04-21 MED ORDER — ACETAMINOPHEN 325 MG PO TABS
650.0000 mg | ORAL_TABLET | Freq: Four times a day (QID) | ORAL | Status: DC | PRN
  Administered 2023-04-21: 650 mg via ORAL
  Filled 2023-04-21: qty 2

## 2023-04-21 MED ORDER — PROCHLORPERAZINE EDISYLATE 10 MG/2ML IJ SOLN
5.0000 mg | INTRAMUSCULAR | Status: AC
  Administered 2023-04-21: 5 mg via INTRAVENOUS
  Filled 2023-04-21: qty 2

## 2023-04-21 MED ORDER — CALCIUM GLUCONATE-NACL 2-0.675 GM/100ML-% IV SOLN
2.0000 g | Freq: Once | INTRAVENOUS | Status: AC
  Administered 2023-04-21: 2000 mg via INTRAVENOUS
  Filled 2023-04-21: qty 100

## 2023-04-21 MED ORDER — DIPHENHYDRAMINE HCL 50 MG/ML IJ SOLN: 50.0000 mg | Freq: Once | INTRAMUSCULAR | Status: AC

## 2023-04-21 MED ORDER — SODIUM CHLORIDE 0.9 % IV SOLN: INTRAVENOUS | Status: AC

## 2023-04-21 MED ORDER — GADOBUTROL 1 MMOL/ML IV SOLN
9.5000 mL | Freq: Once | INTRAVENOUS | Status: AC | PRN
  Administered 2023-04-21: 9.5 mL via INTRAVENOUS

## 2023-04-21 MED ORDER — ONDANSETRON HCL 4 MG PO TABS: 4.0000 mg | ORAL_TABLET | Freq: Four times a day (QID) | ORAL | Status: DC | PRN

## 2023-04-21 MED ORDER — ENOXAPARIN SODIUM 40 MG/0.4ML IJ SOSY
40.0000 mg | PREFILLED_SYRINGE | INTRAMUSCULAR | Status: DC
  Administered 2023-04-21 – 2023-04-23 (×3): 40 mg via SUBCUTANEOUS
  Filled 2023-04-21 (×3): qty 0.4

## 2023-04-21 MED ORDER — SODIUM CHLORIDE 0.9 % IV SOLN
1.0000 g | INTRAVENOUS | Status: DC
  Administered 2023-04-21 – 2023-04-22 (×2): 1 g via INTRAVENOUS
  Filled 2023-04-21 (×2): qty 10

## 2023-04-21 MED ORDER — IOHEXOL 350 MG/ML SOLN
75.0000 mL | Freq: Once | INTRAVENOUS | Status: AC | PRN
  Administered 2023-04-21: 75 mL via INTRAVENOUS

## 2023-04-21 MED ORDER — ALBUTEROL SULFATE (2.5 MG/3ML) 0.083% IN NEBU: 2.5000 mg | INHALATION_SOLUTION | Freq: Four times a day (QID) | RESPIRATORY_TRACT | Status: DC | PRN

## 2023-04-21 MED ORDER — CLOPIDOGREL BISULFATE 75 MG PO TABS
75.0000 mg | ORAL_TABLET | Freq: Every day | ORAL | Status: DC
Start: 2023-04-21 — End: 2023-04-23
  Administered 2023-04-21 – 2023-04-23 (×3): 75 mg via ORAL
  Filled 2023-04-21 (×3): qty 1

## 2023-04-21 MED ORDER — DIAZEPAM 5 MG/ML IJ SOLN
2.5000 mg | Freq: Once | INTRAMUSCULAR | Status: DC
  Filled 2023-04-21: qty 2

## 2023-04-21 MED ORDER — DIPHENHYDRAMINE HCL 25 MG PO CAPS
50.0000 mg | ORAL_CAPSULE | Freq: Once | ORAL | Status: AC
  Administered 2023-04-21: 50 mg via ORAL
  Filled 2023-04-21: qty 2

## 2023-04-21 MED ORDER — ATORVASTATIN CALCIUM 40 MG PO TABS
40.0000 mg | ORAL_TABLET | Freq: Every day | ORAL | Status: DC
Start: 2023-04-21 — End: 2023-04-23
  Administered 2023-04-21 – 2023-04-23 (×3): 40 mg via ORAL
  Filled 2023-04-21 (×3): qty 1

## 2023-04-21 MED ORDER — HYDRALAZINE HCL 20 MG/ML IJ SOLN
10.0000 mg | INTRAMUSCULAR | Status: DC | PRN
  Administered 2023-04-22 (×2): 10 mg via INTRAVENOUS
  Filled 2023-04-21 (×2): qty 1

## 2023-04-21 MED ORDER — ONDANSETRON HCL 4 MG/2ML IJ SOLN: 4.0000 mg | Freq: Four times a day (QID) | INTRAMUSCULAR | Status: DC | PRN

## 2023-04-21 MED ORDER — GUAIFENESIN ER 600 MG PO TB12
600.0000 mg | ORAL_TABLET | Freq: Two times a day (BID) | ORAL | Status: DC
  Administered 2023-04-21 – 2023-04-23 (×5): 600 mg via ORAL
  Filled 2023-04-21 (×5): qty 1

## 2023-04-21 MED ORDER — ASPIRIN 81 MG PO TBEC
81.0000 mg | DELAYED_RELEASE_TABLET | Freq: Every day | ORAL | Status: DC
  Administered 2023-04-21 – 2023-04-23 (×3): 81 mg via ORAL
  Filled 2023-04-21 (×3): qty 1

## 2023-04-21 MED ORDER — STROKE: EARLY STAGES OF RECOVERY BOOK
Freq: Once | Status: AC
  Filled 2023-04-21: qty 1

## 2023-04-21 MED ORDER — DIAZEPAM 5 MG/ML IJ SOLN
2.5000 mg | Freq: Once | INTRAMUSCULAR | Status: AC
Start: 2023-04-21 — End: 2023-04-21
  Administered 2023-04-21: 2.5 mg via INTRAVENOUS
  Filled 2023-04-21: qty 2

## 2023-04-21 MED ORDER — ACETAMINOPHEN 650 MG RE SUPP
650.0000 mg | Freq: Four times a day (QID) | RECTAL | Status: DC | PRN
Start: 2023-04-21 — End: 2023-04-23

## 2023-04-21 MED ORDER — PREDNISONE 5 MG PO TABS
50.0000 mg | ORAL_TABLET | Freq: Four times a day (QID) | ORAL | Status: AC
  Administered 2023-04-21 (×3): 50 mg via ORAL
  Filled 2023-04-21 (×3): qty 2

## 2023-04-21 NOTE — Progress Notes (Addendum)
 Patient positive for RSV.  Droplet precautions ordered.  Continue symptomatic treatment

## 2023-04-21 NOTE — Evaluation (Signed)
 Physical Therapy Evaluation Patient Details Name: Colleen Baker MRN: [id number] DOB: [birth year] Today's Date: 04/21/2023  History of Present Illness  Pt is a 48 y.o. female who presented 04/20/23 with R-sided weakness and ataxia. 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 PMH: eczema, Chron's disease, anemia, arthritis, GERD, heart murmur, HTN   Clinical Impression  Pt presents with condition above and deficits mentioned below, see PT Problem List. PTA, she was independent without DME, living with her mother and 43 y.o. son in a 1-level house with 4 STE. Pt has been out of work for x3 years due to L upper and lower extremity injuries. Currently, pt is demonstrating deficits in R lower extremity strength, coordination, and sensation (sensation worse distally). She is also displaying deficits in balance. Despite these deficits, the pt was able to perform bed mobility and transfer to stand independently and ambulate slowly without UE support, without LOB, with only supervision for safety. She compensates for her decreased R foot clearance by circumducting her R leg when ambulating. She would benefit from further acute PT services and follow-up with neuro OPPT to address her deficits mentioned above and maximize her return to baseline. The distance pt ambulated was limited by her HTN this date, see General Comments below.      If plan is discharge home, recommend the following: Assistance with cooking/housework;Assist for transportation;Help with stairs or ramp for entrance   Can travel by private vehicle        Equipment Recommendations None recommended by PT  Recommendations for Other Services       Functional Status Assessment Patient has had a recent decline in their functional status and demonstrates the ability to make significant improvements in function in a reasonable and predictable amount of time.     Precautions / Restrictions Precautions Precautions: Fall Precaution Comments: watch BP  (HTN) Restrictions Weight Bearing Restrictions Per Provider Order: No      Mobility  Bed Mobility Overal bed mobility: Modified Independent             General bed mobility comments: HOB elevated, no assistance needed    Transfers Overall transfer level: Independent Equipment used: None               General transfer comment: No assistance needed, no LOB    Ambulation/Gait Ambulation/Gait assistance: Supervision Gait Distance (Feet): 80 Feet Assistive device: None Gait Pattern/deviations: Step-through pattern, Decreased stride length, Decreased dorsiflexion - right, Wide base of support Gait velocity: reduced Gait velocity interpretation: 1.31 - 2.62 ft/sec, indicative of limited community ambulator   General Gait Details: Pt ambulates with a reduced speed with a step-through gait pattern. Pt appears to compensate for decreased R foot clearance during swing phase by curcumducting R leg, resulting in a wide BOS. No LOB, even when cued to navigate around obstacles and turn head L <> R, supervision for safety. Distance limited due to HTN noted.  Stairs            Wheelchair Mobility     Tilt Bed    Modified Rankin (Stroke Patients Only) Modified Rankin (Stroke Patients Only) Pre-Morbid Rankin Score: Slight disability (out of work due to L UE and leg injuries) Modified Rankin: Moderate disability     Balance Overall balance assessment: Mild deficits observed, not formally tested  Pertinent Vitals/Pain Pain Assessment Pain Assessment: Faces Faces Pain Scale: Hurts a little bit Pain Location: generalized Pain Descriptors / Indicators: Discomfort Pain Intervention(s): Monitored during session, Limited activity within patient's tolerance, Repositioned    Home Living Family/patient expects to be discharged to:: Private residence Living Arrangements: Parent;Other relatives (mother and 35 y.o.  son) Available Help at Discharge: Family Type of Home: House Home Access: Stairs to enter Entrance Stairs-Rails: Can reach both Entrance Stairs-Number of Steps: 4   Home Layout: One level Home Equipment: None      Prior Function Prior Level of Function : Independent/Modified Independent             Mobility Comments: ind. 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     Extremity/Trunk Assessment   Upper Extremity Assessment Upper Extremity Assessment: Defer to OT evaluation RUE Deficits / Details: mild heavy feeling, mild diminished light touch to ulnar side, but overall good strength and coordination RUE Coordination: WNL    Lower Extremity Assessment Lower Extremity Assessment: RLE deficits/detail (pt reports hx of L leg issues from injuries, noted mild hip weakness) RLE Deficits / Details: MMT scores of the following - hip flexion 4 bil, knee extension 4 R (5 L), ankle dorsiflexion 4+ R (5 L); decreased sensation throughout leg, worse distally than proximally; dysmetria noted RLE Sensation: decreased light touch RLE Coordination: decreased gross motor;decreased fine motor    Cervical / Trunk Assessment Cervical / Trunk Assessment: Normal  Communication   Communication Communication: No apparent difficulties  Cognition Arousal: Alert Behavior During Therapy: WFL for tasks assessed/performed Overall Cognitive Status: Within Functional Limits for tasks assessed                                          General Comments General comments (skin integrity, edema, etc.): SBP 170s start of session, 190s after ambulating, 180s supine end of session; educated pt and family on BE FAST and her risk for falls and to be cautious with stepping over obstacles    Exercises     Assessment/Plan    PT Assessment  Patient needs continued PT services  PT Problem List Decreased strength;Decreased activity tolerance;Decreased balance;Decreased mobility;Decreased coordination;Impaired sensation       PT Treatment Interventions DME instruction;Gait training;Stair training;Functional mobility training;Therapeutic activities;Therapeutic exercise;Balance training;Neuromuscular re-education;Patient/family education    PT Goals (Current goals can be found in the Care Plan section)  Acute Rehab PT Goals Patient Stated Goal: to improve and not have another CVA PT Goal Formulation: With patient/family Time For Goal Achievement: 05/05/23 Potential to Achieve Goals: Good    Frequency Min 1X/week     Co-evaluation               AM-PAC PT 6 Clicks Mobility  Outcome Measure Help needed turning from your back to your side while in a flat bed without using bedrails?: None Help needed moving from lying on your back to sitting on the side of a flat bed without using bedrails?: None Help needed moving to and from a bed to a chair (including a wheelchair)?: None Help needed standing up from a chair using your arms (e.g., wheelchair or bedside chair)?: None Help needed to walk in hospital room?: A Little Help needed  climbing 3-5 steps with a railing? 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

## 2023-04-21 NOTE — ED Provider Notes (Signed)
  Physical Exam  BP (!) 171/111   Pulse 80   Temp 98.3 F (36.8 C) (Tympanic)   Resp 18   Ht 5' 10 (1.778 m)   Wt 93 kg   LMP 10/31/2018   SpO2 97%   BMI 29.41 kg/m   Physical Exam Vitals and nursing note reviewed.  Constitutional:      General: She is not in acute distress.    Appearance: She is well-developed.  HENT:     Head: Normocephalic and atraumatic.  Eyes:     Conjunctiva/sclera: Conjunctivae normal.  Cardiovascular:     Rate and Rhythm: Normal rate and regular rhythm.     Heart sounds: No murmur heard. Pulmonary:     Effort: Pulmonary effort is normal. No respiratory distress.     Breath sounds: Normal breath sounds.  Abdominal:     Palpations: Abdomen is soft.     Tenderness: There is no abdominal tenderness.  Musculoskeletal:        General: No swelling.     Cervical back: Neck supple.  Skin:    General: Skin is warm and dry.     Capillary Refill: Capillary refill takes less than 2 seconds.  Neurological:     General: No focal deficit present.     Mental Status: She is alert.     GCS: GCS eye subscore is 4. GCS verbal subscore is 5. GCS motor subscore is 6.     Cranial Nerves: Cranial nerves 2-12 are intact. No cranial nerve deficit.     Sensory: Sensation is intact. No sensory deficit.     Motor: Weakness present.     Comments: 4/5 strength RLE, 4/5 strength RUE. 5/5 strength LLE, 5/5 strength LUE  Psychiatric:        Mood and Affect: Mood normal.     Procedures  Procedures  ED Course / MDM    Medical Decision Making Amount and/or Complexity of Data Reviewed Labs: ordered. Radiology: ordered.  Risk Prescription drug management. Decision regarding hospitalization.   48 year old female transferred from outside ED for MRI.  Please see HPI for the previous provider for further details.  In short, 48 year old female who woke up yesterday morning with right-sided weakness.  Was sent here for MRI.  Patient required 7.5 mg Valium , 5 mg  Compazine  for sedation to properly get study.  MRI shows 12 mm lesion in the left corona radiata with restricted diffusion which is favoring acute infarct.  Have reached back out to Dr. Jerrie, neurology, to discuss.  Dr. Jerrie recommends admitting to medicine for stroke workup.  Have placed page to [facility] hospitalist service.  Spoke with Dr. Sundil who states that they will be by to see the patient for admission.  Patient stable at this time.     Ruthell Lonni FALCON, PA-C 04/21/23 [id number]    Trine Raynell Moder, MD 04/21/23 [phone]

## 2023-04-21 NOTE — H&P (Addendum)
 History and Physical    Patient: Colleen Baker FMW:[id number] DOB: Jun 14, 1975 DOA: 04/20/2023 DOS: the patient was seen and examined on 04/21/2023 PCP: Pcp, No  Patient coming from: [facility]  Chief Complaint:  Chief Complaint  Patient presents with   Weakness   HPI: Colleen Baker is a 48 y.o. female with medical history significant of hypertension, heart murmur, asthma, Crohn's disease in remission, chronic back pain, subclinical hypothyroidism, anxiety, depression, GERD, and tobacco use presents with right-sided weakness. The symptoms began 2 days ago, upon waking up to use the bathroom before sunrise.  At night she went to sleep in her normal state of health.  After getting up the patient noticed difficulty walking and manipulating objects with her right hand. There were no associated changes in speech, vision, or facial drooping. The patient denied any significant headache at the time but reported a history of frontal headaches, similar to prior episodes. The patient also reported a slight increase in heart rate, which was attributed to anxiety.  In addition to the weakness, the patient reported a recent onset of a cough that started about 5 days ago, with no colored expectoration. The patient also reported intermittent vaginal spotting over the past couple weeks, despite having had a hysterectomy many years ago. The patient denied any urinary symptoms such as burning or frequency.  She denies having any recent fever chest pain, shortness of breath, nausea, vomiting, or diarrhea symptoms.  The patient's family history is significant for heart disease in the mother, who had stents placed, and kidney issues in the father, who also had Parkinson's disease. The patient denied any family history of early stroke or clotting disorder to her knowledge.  Due to the persistence of her symptoms she had been initially evaluated at [facility] yesterday and travel by private vehicle to the  hospital.  Upon admission into the emergency department patient was noted to be afebrile with mild tachypnea, blood pressures elevated up to 208/114, and all other vital signs relatively within normal limits.  Labs significant for hemoglobin 11.6, sodium 134, potassium 2.7, BUN 12, creatinine 1.14, calcium  8.4, and magnesium  1.4. CT scan of the head noted no acute stroke, but did note mild cerebral white matter disease that was abnormal for patient's age.  Patient had been seen by neurology and not a candidate for thrombolytics due to being out of the window.  Urinalysis noted trace hemoglobin, moderate leukocytes, few bacteria, and 21-50 WBCs. Subsequent MRI of the brain was obtained which noted 12 mm lesion in the left corona radiata with restricted diffusion favoring acute infarct over demyelinating plaque.  Patient had been given potassium chloride  60 meq p.o., 2 g of magnesium  sulfate IV, aspirin , and Plavix .  Neurology recommended completion of stroke workup including hypercoagulable panel.    Review of Systems: As mentioned in the history of present illness. All other systems reviewed and are negative. Past Medical History:  Diagnosis Date   Anemia    allergy induced   Anxiety    Arthritis    Asthma with allergic rhinitis    Chronic back pain    Crohn's disease in remission (HCC)    Depression    Eczema    GERD (gastroesophageal reflux disease)    Headache    Heart murmur    asymptomatic    History of abnormal cervical Pap smear    History of kidney stones    Hypertension    Irritable bowel    Pinched nerve in  neck    Uterine fibroid    Past Surgical History:  Procedure Laterality Date   ABDOMINAL HYSTERECTOMY     BUNIONECTOMY Left    CESAREAN SECTION  08/31/2011   Procedure: CESAREAN SECTION;  Surgeon: Marjorie DEL. Okey, MD;  Location: [facility];  Service: Gynecology;  Laterality: N/A;   COLONOSCOPY  last one 2015   CYSTOSCOPY  11/06/2018   Procedure: CYSTOSCOPY;  Surgeon:  Sarrah Browning, MD;  Location: [facility];  Service: Gynecology;;   DILATATION & CURETTAGE/HYSTEROSCOPY WITH MYOSURE N/A 01/13/2015   Procedure: DILATATION & CURETTAGE/HYSTEROSCOPY WITH  MYOSURE;  Surgeon: Marjorie Okey, MD;  Location: [facility];  Service: Gynecology;  Laterality: N/A;   ganglion cyst removed Right    hiatal hernia repair  as child   LEEP  1990's   MENISCUS REPAIR Right 6 yrs ago   MYOMECTOMY ABDOMINAL APPROACH  02/15/2005   NORPLANT  REMOVAL Left 11/06/2018   Procedure: REMOVAL OF NEXPLANON;  Surgeon: Sarrah Browning, MD;  Location: [facility] [place];  Service: Gynecology;  Laterality: Left;   ROBOT ASSISTED MYOMECTOMY  06/2010   ROBOTIC ASSISTED LAPAROSCOPIC HYSTERECTOMY AND SALPINGECTOMY Bilateral 11/06/2018   Procedure: XI ROBOTIC ASSISTED LAPAROSCOPIC HYSTERECTOMY WITH BILATERAL SALPINGECTOMY;  Surgeon: Sarrah Browning, MD;  Location: [facility];  Service: Gynecology;  Laterality: Bilateral;   SHOULDER SURGERY     Social History:  reports that she has never smoked. She has been exposed to tobacco smoke. She has never used smokeless tobacco. She reports current alcohol use. She reports that she does not use drugs.  Allergies  Allergen Reactions   Aciphex [Rabeprazole Sodium] Hives   Lisinopril Swelling    Facial    Iodinated Contrast Media Hives and Itching    Contrast dye    Family History  Problem Relation Age of Onset   Hypertension Mother    Asthma Mother    Bronchitis Mother    Kidney disease Father    Asthma Father    Eczema Sister    Sinusitis Sister    Asthma Maternal Grandmother    Eczema Maternal Grandmother    Bronchitis Maternal Grandmother    Allergic rhinitis Son    Eczema Son    Urticaria Neg Hx    Immunodeficiency Neg Hx    Angioedema Neg Hx     Prior to Admission medications   Medication Sig Start Date End Date Taking? Authorizing Provider  albuterol  (VENTOLIN  HFA) 108  (90 Base) MCG/ACT inhaler INHALE 2 PUFFS INTO THE LUNGS EVERY 4 HOURS AS NEEDED FOR [facility] OR SHORTNESS OF BREATH 12/25/22   Marinda Rocky SAILOR, MD  amLODipine  (NORVASC ) 10 MG tablet TAKE ONE-HALF TABLET BY MOUTH DAILY FOR 3 DAYS. INCREASE TO 1 TABLET BY MOUTH DAILY IF TOLERANT 12/06/18   [provider]  azelastine  (ASTELIN ) 0.1 % nasal spray Place 1-2 sprays into both nostrils 2 (two) times daily as needed. 10/25/22   Marinda Rocky SAILOR, MD  budesonide -formoterol  (SYMBICORT ) 160-4.5 MCG/ACT inhaler Take 2 puffs 3-7 days a week for control of asthma symptoms 03/20/23   Ambs, Arlean HERO, FNP  DULoxetine  (CYMBALTA ) 60 MG capsule TAKE ONE CAPSULE BY MOUTH DAILY 03/17/19   [provider]  dupilumab  (DUPIXENT ) 300 MG/2ML prefilled syringe Inject 600 mg into the skin once for 1 dose. Then 300mg  every 14 days 12/06/22 05/23/23  Marinda Rocky SAILOR, MD  escitalopram  (LEXAPRO ) 10 MG tablet Take 1/2 tablet daily for one week then take 1 tablet daily Patient not taking:  Reported on 03/20/2023 02/14/19   [provider]  eszopiclone (LUNESTA) 2 MG TABS tablet Take 2 tablets  immediately before bedtime Patient not taking: Reported on 03/20/2023 11/27/19   [provider]  Eszopiclone 3 MG TABS Take 3 mg by mouth at bedtime. Patient not taking: Reported on 10/25/2022 10/05/19   [provider]  famotidine  (PEPCID ) 20 MG tablet Take 1 tablet (20 mg total) by mouth daily. 01/31/21   Cheryl Reusing, FNP  flunisolide  (NASALIDE ) 25 MCG/ACT (0.025%) SOLN 2 sprays each nostril once a day as needed for stuffy nose. Patient not taking: Reported on 03/20/2023 09/22/20   Cheryl Reusing, FNP  fluticasone  (FLONASE ) 50 MCG/ACT nasal spray Place 2 sprays into both nostrils daily as needed for allergies or rhinitis. 01/16/23   Ambs, Arlean HERO, FNP  hydrochlorothiazide (HYDRODIURIL) 25 MG tablet TAKE ONE TABLET BY MOUTH DAILY 12/06/18   [provider]  hydrocortisone  2.5 % ointment Apply topically twice  daily as need to red sandpapery rash. 10/25/22   Marinda Rocky SAILOR, MD  levocetirizine (XYZAL ) 5 MG tablet Take up to 2 tablets twice daily as needed for hives and swelling 12/06/22   Marinda Rocky SAILOR, MD  mesalamine  (LIALDA ) 1.2 g EC tablet Take 2.4 g by mouth at bedtime.  Patient not taking: Reported on 03/20/2023    [provider]  metoprolol  succinate (TOPROL -XL) 100 MG 24 hr tablet Take 100 mg by mouth at bedtime. Take with or immediately following a meal.    [provider]  montelukast  (SINGULAIR ) 10 MG tablet Take 1 tablet (10 mg total) by mouth at bedtime. 03/20/23   Cari Arlean HERO, FNP  potassium chloride  (K-DUR) 10 MEQ tablet Take 10 mEq by mouth daily.    [provider]  tacrolimus  (PROTOPIC ) 0.1 % ointment Use topically twice daily as needed for red, sandpapery rash 10/25/22   Marinda Rocky SAILOR, MD  triamcinolone  cream (KENALOG ) 0.1 % Apply 1 Application topically 2 (two) times daily as needed (itching). Do not use on face, groin or arm pits. 10/25/22   Marinda Rocky SAILOR, MD    Physical Exam: Vitals:   04/21/23 0453 04/21/23 0500 04/21/23 0530 04/21/23 0600  BP: (!) 175/118 (!) 191/114 (!) 171/111 (!) 169/112  Pulse: 86 83 80 82  Resp: 17 18  18   Temp: 98.3 F (36.8 C)     TempSrc: Tympanic     SpO2: 98% 100% 97% 97%  Weight:      Height:       Constitutional: Middle-aged female currently no acute distress Eyes: PERRL, lids and conjunctivae normal ENMT: Mucous membranes are moist.  Normal dentition.  No facial symmetry Neck: normal, supple  Respiratory: clear to auscultation bilaterally, no wheezing, no crackles. Normal respiratory effort. No accessory muscle use.  Cardiovascular: Regular rate and rhythm, no murmurs / rubs / gallops. No extremity edema. 2+ pedal pulses. No carotid bruits.  Abdomen: no tenderness, no masses palpated. No hepatosplenomegaly. Bowel sounds positive.  Musculoskeletal: no clubbing / cyanosis. No joint deformity upper and lower  extremities. Good ROM, no contractures. Normal muscle tone.  Skin: no rashes, lesions, ulcers. No induration Neurologic: CN 2-12 grossly intact.  Strength 5/5 on the left upper and lower extremity, but 4+ on the right upper and lower extremity. Psychiatric: Normal judgment and insight. Alert and oriented x 3. Normal mood.   Data Reviewed:  EKG reveals sinus rhythm at 91 bpm with left axis deviation.  Reviewed labs, imaging, and pertinent records as documented.  Assessment and Plan:  CVA Acute.  Patient presents with right-sided weakness which started 2 days ago.  She reported palpitations and records note history of cardiac murmur..  CT scan of the head noted no acute stroke, but did note mild cerebral white matter disease that was abnormal for patient's age.  Patient had been seen by neurology and not a candidate for thrombolytics due to being out of the window.   Subsequent MRI of the brain was obtained which noted 12 mm lesion in the left corona radiata with restricted diffusion favoring acute infarct over demyelinating plaque.  Neurology had placed orders for completion of stroke workup including hypercoagulable panel.  Patient was also started on aspirin  and Plavix . -Admit to a medical telemetry bed -Stroke order set utilized -Neurochecks -Follow-up lipid panel and hemoglobin A1c -Follow-up hypercoagulable panel -Follow-up CT angiogram of the head neck -Check echocardiogram -PT/OT to evaluate and treat -Continue aspirin  and Plavix  -Follow-up telemetry -Transitions of care consulted -Appreciate neurology consultative services,  will follow-up for any further recommendations.  Urinary tract infection Vaginal Spotting  Acute. Patient reports intermittent vaginal spotting over the last couple weeks.  Notes remote history of hysterectomy.  Urinalysis noted trace hemoglobin, moderate leukocytes, few bacteria, 0-5 RBCs/hpf, and 21-50 WBCs.  Question if urinary tract infection is the cause  of vaginal spotting versus abnormality of the vagina or ovaries. -Check urine culture -Check transvaginal ultrasound -Rocephin  IV -Consider need to formally consult OB/GYN if transvaginal ultrasound noted to be abnormal  Electrolyte disturbance Hyponatremia Hypokalemia Hypomagnesemia Hypocalcemia Acute on chronic.  Initial labs noted sodium 134, potassium 2.7, calcium  8.4, and magnesium  1.4.  Patient noted to have normal electrolytes on occasion based off of lab work. -Replacing electrolytes.  Continue monitoring replace as needed -May warrant further investigation looking into further workup into the cause  Asthma, without acute exacerbation Cough Patient reports having an intermittently productive cough has been present over the last 5 days.  On physical exam lung sounds clear without significant wheezing. -Check respiratory virus panel and procalcitonin -Check chest x-ray -Albuterol  nebs as needed for shortness of breath/wheezing -Mucinex   Renal insufficiency Chronic.  Creatinine 1.14 with BUN 12 which appears around patient's baseline. -Normal saline IV fluids at 75 mL/h -Continue to monitor kidney function  Normocytic anemia Hemoglobin 11.6 which appears around patient's baseline which ranges from 10-12. -Continue to monitor  Crohn's disease, in remission Patient denies having any significant episodes of diarrhea.  Subclinical hypothyroidism Labs from 10/25/2022 noted TSH- 6.62 and free T4-1.1. -Check TSH and free T4  Overweight BMI 29.41 kg/m  DVT prophylaxis:Lovenox  Advance Care Planning:   Code Status: Full Code    Consults: Neurology  Family Communication: mother updated over the phone  Severity of Illness: The appropriate patient status for this patient is INPATIENT. Inpatient status is judged to be reasonable and necessary in order to provide the required intensity of service to ensure the patient's safety. The patient's presenting symptoms, physical exam  findings, and initial radiographic and laboratory data in the context of their chronic comorbidities is felt to place them at high risk for further clinical deterioration. Furthermore, it is not anticipated that the patient will be medically stable for discharge from the hospital within 2 midnights of admission.   * I certify that at the point of admission it is my clinical judgment that the patient will require inpatient hospital care spanning beyond 2 midnights from the point of admission due to high intensity of service, high risk for further deterioration  and high frequency of surveillance required.*  Author: Maximino DELENA Sharps, MD 04/21/2023 7:17 AM  For on call review [email or web address].

## 2023-04-21 NOTE — ED Notes (Signed)
Echo in progress at bedside.

## 2023-04-21 NOTE — Progress Notes (Signed)
 STROKE TEAM PROGRESS NOTE   SUBJECTIVE (INTERVAL HISTORY) No family is at the bedside.  Overall her condition is stable. She still has some weakness and sensory deficit on the RLE, pending PT evaluation.    OBJECTIVE Temp:  [97.7 F (36.5 C)-99.1 F (37.3 C)] 97.8 F (36.6 C) (01/04 1430) Pulse Rate:  [79-100] 90 (01/04 1430) Resp:  [13-22] 16 (01/04 1430) BP: (161-208)/(81-133) 174/119 (01/04 1430) SpO2:  [95 %-100 %] 95 % (01/04 1430) Weight:  [93 kg] 93 kg (01/03 1810)  No results for input(s): GLUCAP in the last 168 hours. Recent Labs  Lab 04/20/23 1448 04/21/23 0724  NA 134* 134*  K 2.7* 3.1*  CL 99 99  CO2 27 24  GLUCOSE 125* 114*  BUN 12 6  CREATININE 1.14* 1.04*  CALCIUM  8.4* 8.2*  MG 1.4*  --    No results for input(s): AST, ALT, ALKPHOS, BILITOT, PROT, ALBUMIN in the last 168 hours. Recent Labs  Lab 04/20/23 1448  WBC 9.3  HGB 11.6*  HCT 35.7*  MCV 81.5  PLT 222   No results for input(s): CKTOTAL, CKMB, CKMBINDEX, TROPONINI in the last 168 hours. No results for input(s): LABPROT, INR in the last 72 hours. Recent Labs    04/20/23 1842 04/21/23 1023  COLORURINE YELLOW YELLOW  LABSPEC 1.020 >1.046*  PHURINE 7.0 6.0  GLUCOSEU NEGATIVE NEGATIVE  HGBUR TRACE* NEGATIVE  BILIRUBINUR NEGATIVE NEGATIVE  KETONESUR NEGATIVE NEGATIVE  PROTEINUR 30* 30*  NITRITE NEGATIVE NEGATIVE  LEUKOCYTESUR MODERATE* NEGATIVE       Component Value Date/Time   CHOL 208 (H) 04/21/2023 0724   TRIG 117 04/21/2023 0724   HDL 55 04/21/2023 0724   CHOLHDL 3.8 04/21/2023 0724   VLDL 23 04/21/2023 0724   LDLCALC 130 (H) 04/21/2023 0724   No results found for: HGBA1C    Component Value Date/Time   LABOPIA NONE DETECTED 04/21/2023 1023   COCAINSCRNUR NONE DETECTED 04/21/2023 1023   LABBENZ POSITIVE (A) 04/21/2023 1023   AMPHETMU NONE DETECTED 04/21/2023 1023   THCU NONE DETECTED 04/21/2023 1023   LABBARB NONE DETECTED 04/21/2023 1023    No  results for input(s): ETH in the last 168 hours.  I have personally reviewed the radiological images below and agree with the radiology interpretations.  US  PELVIS TRANSVAGINAL NON-OB (TV ONLY) Result Date: 04/21/2023 CLINICAL DATA:  Vaginal spotting EXAM: ULTRASOUND PELVIS TRANSVAGINAL TECHNIQUE: Transvaginal ultrasound examination of the pelvis was performed including evaluation of the uterus, ovaries, adnexal regions, and pelvic cul-de-sac. COMPARISON:  None Available. FINDINGS: Uterus Status post hysterectomy Endometrium Not applicable Right ovary Measurements: 5.1 x 3.1 x 3.9 cm = volume: 32.0 mL. There are 2 dominant subjacent cysts versus single large septated cyst. Collectively, these measure 4.3 x 2.7 cm. The dominant cystic component measures 3.1 x 2.3 cm. Left ovary Not visualized. Other findings:  No abnormal free fluid IMPRESSION: 1. Status post hysterectomy. 2. Nonvisualization of the left ovary. 3. There are 2 dominant subjacent cysts versus single large septated cyst within the right ovary. Collectively, these measure 4.3 x 2.7 cm. The dominant cystic component measures 3.1 x 2.3 cm. Recommend follow-up ultrasound in 6-12 weeks to assess for resolution of the cysts. Electronically Signed   By: Waddell Calk M.D.   On: 04/21/2023 11:32   DG CHEST PORT 1 VIEW Result Date: 04/21/2023 CLINICAL DATA:  48 year old female with history of weakness for the past 3 days. EXAM: PORTABLE CHEST 1 VIEW COMPARISON:  Chest x-ray 06/28/2022. FINDINGS: Lung volumes are  normal. No consolidative airspace disease. No pleural effusions. No pneumothorax. No evidence of pulmonary edema. Heart size is normal. The patient is rotated to the right on today's exam, resulting in distortion of the mediastinal contours and reduced diagnostic sensitivity and specificity for mediastinal pathology. IMPRESSION: 1. No radiographic evidence of acute cardiopulmonary disease. Electronically Signed   By: Toribio Aye M.D.   On:  04/21/2023 08:17   CT ANGIO HEAD NECK W WO CM Result Date: 04/21/2023 CLINICAL DATA:  Stroke, determine embolic source. EXAM: CT ANGIOGRAPHY HEAD AND NECK WITH AND WITHOUT CONTRAST TECHNIQUE: Multidetector CT imaging of the head and neck was performed using the standard protocol during bolus administration of intravenous contrast. Multiplanar CT image reconstructions and MIPs were obtained to evaluate the vascular anatomy. Carotid stenosis measurements (when applicable) are obtained utilizing NASCET criteria, using the distal internal carotid diameter as the denominator. RADIATION DOSE REDUCTION: This exam was performed according to the departmental dose-optimization program which includes automated exposure control, adjustment of the mA and/or kV according to patient size and/or use of iterative reconstruction technique. CONTRAST:  75mL OMNIPAQUE  IOHEXOL  350 MG/ML SOLN COMPARISON:  Brain MRI from earlier today FINDINGS: CT HEAD FINDINGS Brain: Known insult at the left corona radiata by prior brain MRI. No hemorrhage or visible progression. No gray matter infarct detected. Vascular: See below Skull: Normal. Negative for fracture or focal lesion. Sinuses/Orbits: Negative Review of the MIP images confirms the above findings CTA NECK FINDINGS Aortic arch: Normal appearance of the arch. Three vessel branching with great vessels partially obscured by streak artifact. Right carotid system: Vessels are smoothly contoured and widely patent. No significant atheromatous plaque. Left carotid system: Vessels are smoothly contoured and widely patent. No significant atheromatous plaque. There is obscured proximal common carotid due to streak artifact from intravenous contrast. Vertebral arteries: The vertebral and subclavian arteries are widely patent and smoothly contoured. Dominant right vertebral artery. Skeleton: Negative. Other neck: No significant finding Upper chest: Clear apical lungs Review of the MIP images confirms  the above findings CTA HEAD FINDINGS Anterior circulation: No significant stenosis, proximal occlusion, aneurysm, or vascular malformation. Luminal vessel irregularity affecting medium size branches on thick MIPS could be technical or from atherosclerosis. Posterior circulation: The vertebral and basilar arteries are smoothly contoured and diffusely patent. No aneurysm or beading. Venous sinuses: Unremarkable for arterial timing Anatomic variants: None significant Review of the MIP images confirms the above findings IMPRESSION: 1. No emergent arterial finding. 2. Some irregularity of medium size intracranial branches on thick MIPS could be a real finding or technical artifact. Are there risk factors for premature atherosclerosis or vasculopathy such as RCVS? Electronically Signed   By: Dorn Roulette M.D.   On: 04/21/2023 07:47   MR BRAIN WO CONTRAST Result Date: 04/21/2023 CLINICAL DATA:  Right-sided upper and lower extremity weakness. Claustrophobia. Evaluate for demyelinating disease. EXAM: MRI HEAD WITHOUT CONTRAST MRI CERVICAL SPINE WITHOUT CONTRAST TECHNIQUE: Multiplanar, multiecho pulse sequences of the brain and surrounding structures, and cervical spine, to include the craniocervical junction and cervicothoracic junction, were obtained without intravenous contrast. Contrast was desired, and in fact injected but no postcontrast images due to presumed extravasation. COMPARISON:  Head CT from yesterday FINDINGS: MRI HEAD FINDINGS Brain: Ovoid restricted diffusion in the left corona radiata measuring 12 mm on coronal acquisition. There is mention of concern for demyelinating disease and acute demyelinating plaque is possible, although the shape and historical acuity favors small vessel infarct. There are other areas of remote white matter insult which  are mild in extent and nonspecific for demyelinating disease. No infratentorial signal abnormality. No hydrocephalus, hemorrhage, or masslike finding.  Vascular: Major flow voids are preserved Skull and upper cervical spine: Normal marrow signal Sinuses/Orbits: Negative MRI CERVICAL SPINE FINDINGS Alignment: Normal Vertebrae: No fracture, evidence of discitis, or bone lesion. Cord: Normal signal and morphology. Posterior Fossa, vertebral arteries, paraspinal tissues: Unremarkable Disc levels: No significant degenerative change. No neural impingement. IMPRESSION: Noncontrast studies due to presumed extravasation of the injected contrast. Brain MRI: 12 mm lesion in the left corona radiata with restricted diffusion, favor acute infarct over demyelinating plaque. Chronic white matter disease is mild with nonspecific pattern. Cervical MRI: Normal MRI of the cervical cord. Electronically Signed   By: Dorn Roulette M.D.   On: 04/21/2023 05:43   MR CERVICAL SPINE WO CONTRAST Result Date: 04/21/2023 CLINICAL DATA:  Right-sided upper and lower extremity weakness. Claustrophobia. Evaluate for demyelinating disease. EXAM: MRI HEAD WITHOUT CONTRAST MRI CERVICAL SPINE WITHOUT CONTRAST TECHNIQUE: Multiplanar, multiecho pulse sequences of the brain and surrounding structures, and cervical spine, to include the craniocervical junction and cervicothoracic junction, were obtained without intravenous contrast. Contrast was desired, and in fact injected but no postcontrast images due to presumed extravasation. COMPARISON:  Head CT from yesterday FINDINGS: MRI HEAD FINDINGS Brain: Ovoid restricted diffusion in the left corona radiata measuring 12 mm on coronal acquisition. There is mention of concern for demyelinating disease and acute demyelinating plaque is possible, although the shape and historical acuity favors small vessel infarct. There are other areas of remote white matter insult which are mild in extent and nonspecific for demyelinating disease. No infratentorial signal abnormality. No hydrocephalus, hemorrhage, or masslike finding. Vascular: Major flow voids are preserved  Skull and upper cervical spine: Normal marrow signal Sinuses/Orbits: Negative MRI CERVICAL SPINE FINDINGS Alignment: Normal Vertebrae: No fracture, evidence of discitis, or bone lesion. Cord: Normal signal and morphology. Posterior Fossa, vertebral arteries, paraspinal tissues: Unremarkable Disc levels: No significant degenerative change. No neural impingement. IMPRESSION: Noncontrast studies due to presumed extravasation of the injected contrast. Brain MRI: 12 mm lesion in the left corona radiata with restricted diffusion, favor acute infarct over demyelinating plaque. Chronic white matter disease is mild with nonspecific pattern. Cervical MRI: Normal MRI of the cervical cord. Electronically Signed   By: Dorn Roulette M.D.   On: 04/21/2023 05:43   CT Head Wo Contrast Result Date: 04/20/2023 CLINICAL DATA:  Neuro deficit, acute, stroke suspected. Right arm and leg weakness. EXAM: CT HEAD WITHOUT CONTRAST TECHNIQUE: Contiguous axial images were obtained from the base of the skull through the vertex without intravenous contrast. RADIATION DOSE REDUCTION: This exam was performed according to the departmental dose-optimization program which includes automated exposure control, adjustment of the mA and/or kV according to patient size and/or use of iterative reconstruction technique. COMPARISON:  None Available. FINDINGS: Brain: There is no evidence of an acute cortically based infarct, intracranial hemorrhage, mass, midline shift, or extra-axial fluid collection. Cerebral volume is normal with normal size of the ventricles. Scattered hypodensities in the cerebral white matter bilaterally are mild in severity but abnormal for age. There is a small focus of slightly more discrete hypodensity in the left corona radiata raising the possibility of a recent lacunar infarct. Vascular: No hyperdense vessel. Skull: No acute fracture or suspicious osseous lesion. Sinuses/Orbits: Mild mucosal thickening in the paranasal  sinuses. Clear mastoid air cells. Unremarkable orbits. Other: None. IMPRESSION: 1. No evidence of an acute cortically based infarct or intracranial hemorrhage. 2. Mild cerebral white  matter disease, abnormal for age and nonspecific though could reflect chronic small vessel ischemia or demyelinating disease. A recent lacunar infarct is not excluded in the left corona radiata. Consider MRI for further evaluation. Electronically Signed   By: Dasie Hamburg M.D.   On: 04/20/2023 16:39     PHYSICAL EXAM  Temp:  [97.7 F (36.5 C)-99.1 F (37.3 C)] 97.8 F (36.6 C) (01/04 1430) Pulse Rate:  [79-100] 90 (01/04 1430) Resp:  [13-22] 16 (01/04 1430) BP: (161-208)/(81-133) 174/119 (01/04 1430) SpO2:  [95 %-100 %] 95 % (01/04 1430) Weight:  [93 kg] 93 kg (01/03 1810)  General - Well nourished, well developed, in no apparent distress.  Ophthalmologic - fundi not visualized due to noncooperation.  Cardiovascular - Regular rhythm and rate.  Mental Status -  Level of arousal and orientation to time, place, and person were intact. Language including expression, naming, repetition, comprehension was assessed and found intact. Attention span and concentration were normal. Recent and remote memory were intact. Fund of Knowledge was assessed and was intact.  Cranial Nerves II - XII - II - Visual field intact OU. III, IV, VI - Extraocular movements intact. V - Facial sensation intact bilaterally. VII - Facial movement intact bilaterally. VIII - Hearing & vestibular intact bilaterally. X - Palate elevates symmetrically. XI - Chin turning & shoulder shrug intact bilaterally. XII - Tongue protrusion intact.  Motor Strength - The patient's strength was normal in all extremities and pronator drift was absent except RLE 4+/5 proximal.  Bulk was normal and fasciculations were absent.   Motor Tone - Muscle tone was assessed at the neck and appendages and was normal.  Reflexes - The patient's reflexes were  symmetrical in all extremities and she had no pathological reflexes.  Sensory - Light touch, temperature/pinprick were assessed and were symmetrical except mild light touch decreased on the RLE.    Coordination - The patient had normal movements in the hands with no ataxia or dysmetria.  Tremor was absent.  Gait and Station - deferred.   ASSESSMENT/PLAN [name] is a 49 y.o. female with history of HTN, LBP, Crohn's disease, fibroid s/p hysterectomy, occasional cigar smoker admitted for difficulty walking and RLE weakness. No TNK given due to outside window.    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 I favor technical artifact but she does have several uncontrolled risk factor for premature atherosclerosis MRI  L CR small infarct MRI C-spine - unremarkable 2D Echo  pending LDL 130 HgbA1c pending UDS benzo Hypercoagulable labs pending Lovenox  for VTE prophylaxis No antithrombotic prior to admission, now on aspirin  81 mg daily and clopidogrel  75 mg daily DAPT for 3 weeks and then ASA alone.  Patient counseled to be compliant with her antithrombotic medications Ongoing aggressive stroke risk factor management Therapy recommendations:  pending Disposition:  pending  Hypertension BP high on presentation Unstable now, still high  Permissive hypertension (OK if <220/120) for 24 hours post stroke and then gradually normalized within 5-7 days. Long term BP goal normotensive  Hyperlipidemia Home meds:  none  LDL 130, goal < 70 Now on lipitor 40 Continue statin at discharge  Tobacco abuse Occasional cigar smoker Smoking cessation counseling provided Pt is willing to quit  Other Stroke Risk Factors Overweight, Body mass index is 29.41 kg/m. Educated on  weight loss/maintenance  Other  Active Problems Hypokalemia K 2.7 - supplement Hx of fibroids s/p hysterectomy LBP Crohn's disease Depression   Hospital day # 0  I spent additional 30 inpatient minutes in total face-to-face time with the patient, more than 50% of which was spent in counseling and coordination of care, reviewing test results, images and medication, and discussing the diagnosis, treatment plan and potential prognosis. This patient's care requiresreview of multiple databases, neurological assessment, discussion with family, other specialists and medical decision making of high complexity.    Ary Cummins, MD PhD Stroke Neurology 04/21/2023 3:03 PM    To contact Stroke Continuity provider, please refer to [email or web address]. After hours, contact General Neurology

## 2023-04-21 NOTE — Hospital Course (Addendum)
 48 year old female history of generalized anxiety disorder, essential hypertension, asthma and GERD presented to emergency department with complaining of right-sided upper and lower extremity weakness when patient woke up yesterday 04/19/2022.   Denies any sensory change.  At presentation to ED patient found to have elevated blood pressure 190/119 which is about the same range.  Otherwise hemodynamically stable. BMP showing low potassium 2.7, elevated creatinine 1.14 low calcium  8.4.  Low magnesium  1.4. CBC unremarkable stable H&H. UA hazy appearance, dipstick positive, protein 30 and leukocyte esterase positive. EKG shows sinus rhythm heart rate 91. CT head unable to exclude and recent lacunar infarction recommended MRI.  MRI showed 12 mm lesion in the left corona radiata with restricted diffusion, favor acute infarct over demyelinating plaque. Chronic white matter disease is mild with nonspecific pattern. MRI cervical spine normal finding.  ED physician reported right-sided upper lower extremityies muscle strength 4/5 and left-sided 5/5.  ED physician consulted neurology Dr. Jerrie recommended admission for stroke workup and working on formal consult.  No recommendation yet for aspirin  and Plavix .  Please follow-up with neurology full consult note for further recommendation.  Hospitalist has been contacted for management of ischemic stroke, hypertensive emergency, hypokalemia and hypomagnesemia.

## 2023-04-21 NOTE — Progress Notes (Signed)
 Echocardiogram 2D Echocardiogram has been performed.  Colleen Baker 04/21/2023, 3:52 PM

## 2023-04-21 NOTE — Evaluation (Signed)
 Occupational Therapy Evaluation Patient Details Name: Colleen Baker MRN: [id number] DOB: December 29, 1975 Today's Date: 04/21/2023   History of Present Illness Pt is a 48 y.o. female who presented 04/20/23 with R-sided weakness and ataxia. 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 PMH: eczema, Chron's disease, anemia, arthritis, GERD, heart murmur, HTN   Clinical Impression   Pt feeling mostly better, mild pain and heaviness to R side. 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 Pt feels close to baseline, some sluggishness with RUE but able to maintain coordination WNLs when focusing on task. Pt balance is good, able to ambulate unsupported as needed, BP was noted to be high, has high BP at baseline. Pt stated she may need help for transportation but feels like she could return home and complete IADLs as needed with increased time. Pt has no further acute OT or follow up needs.        If plan is discharge home, recommend the following: Assist for transportation    Functional Status Assessment  Patient has had a recent decline in their functional status and demonstrates the ability to make significant improvements in function in a reasonable and predictable amount of time.  Equipment Recommendations  None recommended by OT    Recommendations for Other Services       Precautions / Restrictions Precautions Precautions: Fall Restrictions Weight Bearing Restrictions Per Provider Order: No      Mobility Bed Mobility Overal bed mobility: Independent                  Transfers Overall transfer level: Independent                        Balance Overall balance assessment: Independent                                         ADL either performed or assessed with clinical judgement   ADL Overall ADL's : At baseline;Independent                                       General ADL Comments: mild heavy feeling to R side but  strength and overall coordination intact.     Vision Baseline Vision/History: 1 Wears glasses Ability to See in Adequate Light: 0 Adequate Patient Visual Report: No change from baseline       Perception         Praxis         Pertinent Vitals/Pain Pain Assessment Pain Assessment: 0-10 Pain Score: 3  Pain Descriptors / Indicators: Aching, Discomfort Pain Intervention(s): Monitored during session     Extremity/Trunk Assessment Upper Extremity Assessment Upper Extremity Assessment: RUE deficits/detail RUE Deficits / Details: mild heavy feeling, mild diminished light touch to ulnar side, but overall good strength and coordination RUE Coordination: WNL   Lower Extremity Assessment Lower Extremity Assessment: Defer to PT evaluation       Communication Communication Communication: No apparent difficulties   Cognition Arousal: Alert Behavior During Therapy: WFL for tasks assessed/performed Overall Cognitive Status: Within Functional Limits for tasks assessed  General Comments  BP sitting EOB 178/109, standing 186/122.    Exercises     Shoulder Instructions      Home Living Family/patient expects to be discharged to:: Private residence Living Arrangements: Parent Available Help at Discharge: Family Type of Home: House Home Access: Stairs to enter Secretary/administrator of Steps: 4 Entrance Stairs-Rails: Can reach both Home Layout: One level     Bathroom Shower/Tub: Tub/shower unit;Walk-in shower         Home Equipment: None          Prior Functioning/Environment Prior Level of Function : Independent/Modified Independent             Mobility Comments: ind. 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 ADLs Comments: ind        OT Problem List: Decreased strength;Decreased activity tolerance;Pain;Impaired balance  (sitting and/or standing)      OT Treatment/Interventions:      OT Goals(Current goals can be found in the care plan section) Acute Rehab OT Goals Patient Stated Goal: return to PLOF OT Goal Formulation: With patient Time For Goal Achievement: 05/05/23 Potential to Achieve Goals: Good  OT Frequency:      Co-evaluation              AM-PAC OT 6 Clicks Daily Activity     Outcome Measure Help from another person eating meals?: None Help from another person taking care of personal grooming?: None Help from another person toileting, which includes using toliet, bedpan, or urinal?: None Help from another person bathing (including washing, rinsing, drying)?: None Help from another person to put on and taking off regular upper body clothing?: None Help from another person to put on and taking off regular lower body clothing?: None 6 Click Score: 24   End of Session Nurse Communication: Mobility status  Activity Tolerance: Patient tolerated treatment well Patient left: in bed;with call bell/phone within reach;with family/visitor present  OT Visit Diagnosis: Other abnormalities of gait and mobility (R26.89);Pain Pain - Right/Left: Right Pain - part of body: Arm;Leg                Time: 8674-8655 OT Time Calculation (min): 19 min Charges:  OT General Charges $OT Visit: 1 Visit OT Evaluation $OT Eval Low Complexity: 1 Low  [address], OTR/L   Colleen Baker 04/21/2023, 1:49 PM

## 2023-04-22 DIAGNOSIS — E876 Hypokalemia: Secondary | ICD-10-CM | POA: Diagnosis not present

## 2023-04-22 DIAGNOSIS — I639 Cerebral infarction, unspecified: Secondary | ICD-10-CM | POA: Diagnosis not present

## 2023-04-22 LAB — BASIC METABOLIC PANEL
Anion gap: 13 (ref 5–15)
BUN: 19 mg/dL (ref 6–20)
CO2: 23 mmol/L (ref 22–32)
Calcium: 9.5 mg/dL (ref 8.9–10.3)
Chloride: 98 mmol/L (ref 98–111)
Creatinine, Ser: 1.46 mg/dL — ABNORMAL HIGH (ref 0.44–1.00)
GFR, Estimated: 44 mL/min — ABNORMAL LOW (ref >60)
Glucose, Bld: 159 mg/dL — ABNORMAL HIGH (ref 70–99)
Potassium: 3.8 mmol/L (ref 3.5–5.1)
Sodium: 134 mmol/L — ABNORMAL LOW (ref 135–145)

## 2023-04-22 LAB — BETA-2-GLYCOPROTEIN I ABS, IGG/M/A
Beta-2 Glyco I IgG: <9 GPI IgG units (ref 0–20)
Beta-2-Glycoprotein I IgA: <9 GPI IgA units (ref 0–25)
Beta-2-Glycoprotein I IgM: <9 GPI IgM units (ref 0–32)

## 2023-04-22 LAB — PROTEIN C ACTIVITY: Protein C Activity: 127 % (ref 73–180)

## 2023-04-22 LAB — CBC
HCT: 37.3 % (ref 36.0–46.0)
Hemoglobin: 12.4 g/dL (ref 12.0–15.0)
MCH: 26.6 pg (ref 26.0–34.0)
MCHC: 33.2 g/dL (ref 30.0–36.0)
MCV: 79.9 fL — ABNORMAL LOW (ref 80.0–100.0)
Platelets: 261 10*3/uL (ref 150–400)
RBC: 4.67 MIL/uL (ref 3.87–5.11)
RDW: 13.9 % (ref 11.5–15.5)
WBC: 16.5 10*3/uL — ABNORMAL HIGH (ref 4.0–10.5)
nRBC: 0 % (ref 0.0–0.2)

## 2023-04-22 LAB — PROTEIN C, TOTAL: Protein C, Total: 98 % (ref 60–150)

## 2023-04-22 LAB — URINE CULTURE: Culture: <10000 — AB

## 2023-04-22 LAB — PROTEIN S, TOTAL: Protein S Ag, Total: 77 % (ref 60–150)

## 2023-04-22 LAB — PROTEIN S ACTIVITY: Protein S Activity: 75 % (ref 63–140)

## 2023-04-22 LAB — LUPUS ANTICOAGULANT PANEL
DRVVT: 33.6 s (ref 0.0–47.0)
PTT Lupus Anticoagulant: 34.1 s (ref 0.0–43.5)

## 2023-04-22 MED ORDER — METOPROLOL SUCCINATE ER 100 MG PO TB24
100.0000 mg | ORAL_TABLET | Freq: Every day | ORAL | Status: DC
  Administered 2023-04-22 – 2023-04-23 (×2): 100 mg via ORAL
  Filled 2023-04-22 (×2): qty 1

## 2023-04-22 MED ORDER — MONTELUKAST SODIUM 10 MG PO TABS
10.0000 mg | ORAL_TABLET | Freq: Every day | ORAL | Status: DC
  Administered 2023-04-22: 10 mg via ORAL
  Filled 2023-04-22: qty 1

## 2023-04-22 MED ORDER — ASPIRIN 81 MG PO TBEC: 81.0000 mg | DELAYED_RELEASE_TABLET | Freq: Every day | ORAL | 0 refills | Status: AC

## 2023-04-22 MED ORDER — SODIUM CHLORIDE 0.9 % IV SOLN: INTRAVENOUS | Status: AC

## 2023-04-22 MED ORDER — TRIAMCINOLONE ACETONIDE 0.1 % EX CREA: 1.0000 | TOPICAL_CREAM | Freq: Two times a day (BID) | CUTANEOUS | Status: DC | PRN

## 2023-04-22 MED ORDER — FLUTICASONE PROPIONATE 50 MCG/ACT NA SUSP: 2.0000 | Freq: Every day | NASAL | Status: DC | PRN

## 2023-04-22 MED ORDER — LOSARTAN POTASSIUM 25 MG PO TABS
25.0000 mg | ORAL_TABLET | Freq: Every day | ORAL | Status: DC
  Administered 2023-04-22 – 2023-04-23 (×2): 25 mg via ORAL
  Filled 2023-04-22 (×2): qty 1

## 2023-04-22 MED ORDER — ATORVASTATIN CALCIUM 40 MG PO TABS: 40.0000 mg | ORAL_TABLET | Freq: Every day | ORAL | 0 refills | Status: DC

## 2023-04-22 MED ORDER — GUAIFENESIN-DM 100-10 MG/5ML PO SYRP: 5.0000 mL | ORAL_SOLUTION | ORAL | Status: DC | PRN

## 2023-04-22 MED ORDER — MESALAMINE 1.2 G PO TBEC
2.4000 g | DELAYED_RELEASE_TABLET | Freq: Every day | ORAL | Status: DC
  Administered 2023-04-23: 2.4 g via ORAL
  Filled 2023-04-22 (×2): qty 2

## 2023-04-22 MED ORDER — H-E-B INCONTROL BP MONITOR KIT: 1.0000 | PACK | Freq: Three times a day (TID) | 0 refills | Status: AC

## 2023-04-22 MED ORDER — CLOPIDOGREL BISULFATE 75 MG PO TABS: 75.0000 mg | ORAL_TABLET | Freq: Every day | ORAL | 0 refills | Status: AC

## 2023-04-22 MED ORDER — PANTOPRAZOLE SODIUM 40 MG PO TBEC
40.0000 mg | DELAYED_RELEASE_TABLET | Freq: Two times a day (BID) | ORAL | Status: DC
Start: 2023-04-23 — End: 2023-04-23
  Administered 2023-04-23 (×2): 40 mg via ORAL
  Filled 2023-04-22 (×2): qty 1

## 2023-04-22 MED ORDER — AMLODIPINE BESYLATE 5 MG PO TABS
10.0000 mg | ORAL_TABLET | Freq: Every day | ORAL | Status: DC
Start: 2023-04-22 — End: 2023-04-23
  Administered 2023-04-22 – 2023-04-23 (×2): 10 mg via ORAL
  Filled 2023-04-22 (×2): qty 2

## 2023-04-22 MED ORDER — GUAIFENESIN ER 600 MG PO TB12: 600.0000 mg | ORAL_TABLET | Freq: Two times a day (BID) | ORAL | 0 refills | Status: AC

## 2023-04-22 MED ORDER — ESCITALOPRAM OXALATE 10 MG PO TABS
10.0000 mg | ORAL_TABLET | Freq: Every day | ORAL | Status: DC
  Administered 2023-04-22 – 2023-04-23 (×2): 10 mg via ORAL
  Filled 2023-04-22 (×2): qty 1

## 2023-04-22 NOTE — Evaluation (Signed)
 Speech Language Pathology Evaluation Patient Details Name: Colleen Baker MRN: [id number] DOB: 07-24-1975 Today's Date: 04/22/2023 Time: 8459-8448 SLP Time Calculation (min) (ACUTE ONLY): 11 min  Problem List:  Patient Active Problem List   Diagnosis Date Noted   Ischemic stroke (HCC) 04/21/2023   CVA (cerebral vascular accident) (HCC) 04/21/2023   Urinary tract infection 04/21/2023   Vaginal spotting 04/21/2023   Hyponatremia 04/21/2023   Hypokalemia 04/21/2023   Hypocalcemia 04/21/2023   Hypomagnesemia 04/21/2023   Renal insufficiency 04/21/2023   Cough 04/21/2023   Asthma without acute exacerbation 04/21/2023   Crohn's disease (HCC) 04/21/2023   Subclinical hypothyroidism 04/21/2023   Overweight (BMI 25.0-29.9) 04/21/2023   Seasonal allergic conjunctivitis 01/16/2023   Moderate persistent asthma without complication 01/16/2023   Chronic urticaria 10/25/2022   GERD (gastroesophageal reflux disease) 09/17/2019   Postoperative state 11/06/2018   Dermatitis 01/06/2016   Mild persistent asthma 01/06/2016   Seasonal and perennial allergic rhinitis 01/06/2016   Atopic dermatitis 01/06/2016   Past Medical History:  Past Medical History:  Diagnosis Date   Anemia    allergy induced   Anxiety    Arthritis    Asthma with allergic rhinitis    Chronic back pain    Crohn's disease in remission (HCC)    Depression    Eczema    GERD (gastroesophageal reflux disease)    Headache    Heart murmur    asymptomatic    History of abnormal cervical Pap smear    History of kidney stones    Hypertension    Irritable bowel    Pinched nerve in neck    Uterine fibroid    Past Surgical History:  Past Surgical History:  Procedure Laterality Date   ABDOMINAL HYSTERECTOMY     BUNIONECTOMY Left    CESAREAN SECTION  08/31/2011   Procedure: CESAREAN SECTION;  Surgeon: Marjorie DEL. Okey, MD;  Location: [facility];  Service: Gynecology;  Laterality: N/A;   COLONOSCOPY  last one 2015   CYSTOSCOPY   11/06/2018   Procedure: CYSTOSCOPY;  Surgeon: Sarrah Browning, MD;  Location: [facility];  Service: Gynecology;;   DILATATION & CURETTAGE/HYSTEROSCOPY WITH MYOSURE N/A 01/13/2015   Procedure: DILATATION & CURETTAGE/HYSTEROSCOPY WITH  MYOSURE;  Surgeon: Marjorie Okey, MD;  Location: [facility];  Service: Gynecology;  Laterality: N/A;   ganglion cyst removed Right    hiatal hernia repair  as child   LEEP  1990's   MENISCUS REPAIR Right 6 yrs ago   MYOMECTOMY ABDOMINAL APPROACH  02/15/2005   NORPLANT  REMOVAL Left 11/06/2018   Procedure: REMOVAL OF NEXPLANON;  Surgeon: Sarrah Browning, MD;  Location: [facility];  Service: Gynecology;  Laterality: Left;   ROBOT ASSISTED MYOMECTOMY  06/2010   ROBOTIC ASSISTED LAPAROSCOPIC HYSTERECTOMY AND SALPINGECTOMY Bilateral 11/06/2018   Procedure: XI ROBOTIC ASSISTED LAPAROSCOPIC HYSTERECTOMY WITH BILATERAL SALPINGECTOMY;  Surgeon: Sarrah Browning, MD;  Location: [facility] ;  Service: Gynecology;  Laterality: Bilateral;   SHOULDER SURGERY     HPI:  Colleen Baker is a 48 yo female presenting to ED 1/4 with R sided weakness. MRI Brain shows L corona radiata CVA. PMH includes HTN, heart murmur, asthma, Crohn's disease in remission, chronic back pain, subclinical hypothyroidism, anxiety, depression, GERD, tobacco use   Assessment / Plan / Recommendation Clinical Impression  Pt reports being fully independent at home, where she lives with her mother and son. She states she has acute concerns with word-finding. Pt scored 24/30 on the SLUMS (a score  of 27 or above is considered WFL) characterized by deficits related to memory (retrieval > storage) and problem solving. Her responses are subtly delayed at times but it appears to be more representative of a processing deficit rather than an expressive language deficit. Suspect pt's presentation is different than baseline and feel that she may benefit from  ongoing SLP intervention to target these acute deficits. Recommend she receive SLP f/u on an OP basis. Will s/o acutely.    SLP Assessment  SLP Recommendation/Assessment: All further Speech Lanaguage Pathology  needs can be addressed in the next venue of care SLP Visit Diagnosis: Cognitive communication deficit (R41.841)    Recommendations for follow up therapy are one component of a multi-disciplinary discharge planning process, led by the attending physician.  Recommendations may be updated based on patient status, additional functional criteria and insurance authorization.    Follow Up Recommendations  Outpatient SLP    Assistance Recommended at Discharge  PRN  Functional Status Assessment Patient has had a recent decline in their functional status and demonstrates the ability to make significant improvements in function in a reasonable and predictable amount of time.  Frequency and Duration           SLP Evaluation Cognition  Overall Cognitive Status: Impaired/Different from baseline Attention: Sustained Sustained Attention: Appears intact Memory: Impaired Memory Impairment: Retrieval deficit Awareness: Appears intact Problem Solving: Appears intact       Comprehension  Auditory Comprehension Overall Auditory Comprehension: Appears within functional limits for tasks assessed    Expression Expression Primary Mode of Expression: Verbal Verbal Expression Overall Verbal Expression: Appears within functional limits for tasks assessed   Oral / Motor  Oral Motor/Sensory Function Overall Oral Motor/Sensory Function: Within functional limits Motor Speech Overall Motor Speech: Appears within functional limits for tasks assessed            Damien Blumenthal, M.A., CF-SLP Speech Language Pathology, Acute Rehabilitation Services  Secure Chat preferred [phone]  04/22/2023, 4:07 PM

## 2023-04-22 NOTE — Progress Notes (Addendum)
 Progress Note   Patient: Colleen Baker FMW:[id number] DOB: 24-Oct-1975 DOA: 04/20/2023     1 DOS: the patient was seen and examined on 04/22/2023   Brief hospital course: 48 year old female history of generalized anxiety disorder, essential hypertension, asthma and GERD presented to emergency department with complaining of right-sided upper and lower extremity weakness when patient woke up yesterday 04/19/2022.   Denies any sensory change.  At presentation to ED patient found to have elevated blood pressure 190/119 which is about the same range.  Otherwise hemodynamically stable. BMP showing low potassium 2.7, elevated creatinine 1.14 low calcium  8.4.  Low magnesium  1.4. CBC unremarkable stable H&H. UA hazy appearance, dipstick positive, protein 30 and leukocyte esterase positive. EKG shows sinus rhythm heart rate 91. CT head unable to exclude and recent lacunar infarction recommended MRI.  MRI showed 12 mm lesion in the left corona radiata with restricted diffusion, favor acute infarct over demyelinating plaque. Chronic white matter disease is mild with nonspecific pattern. MRI cervical spine normal finding.  ED physician reported right-sided upper lower extremityies muscle strength 4/5 and left-sided 5/5.  ED physician consulted neurology Dr. Jerrie recommended admission for stroke workup and working on formal consult.  No recommendation yet for aspirin  and Plavix .  Please follow-up with neurology full consult note for further recommendation.  Hospitalist has been contacted for management of ischemic stroke, hypertensive emergency, hypokalemia and hypomagnesemia.   Assessment and Plan: CVA -Patient presents with right-sided weakness which started 2 days PTA   -MRI of the brain was obtained which noted 12 mm lesion in the left corona radiata with restricted diffusion favoring acute infarct over demyelinating plaque.   -Neurology had placed orders for completion of stroke workup including  hypercoagulable panel.  Patient was also started on aspirin  and Plavix . -Pt was seen by Stroke team. Recs for DAPT with Aspirin  81mg  and plavix  75mg  daily x 3 weeks and then ASA alone  Uncontrolled HTN -Discussed with Neurology. Plan to resume home bp meds -have resumed all bp meds with exception of hydrochlorothiazide given concurrent IVF per below -Plan to gradually normalize bp over the next 2-3 days, can be done at home   Urinary tract infection ruled out Vaginal Spotting  -Patient reports intermittent vaginal spotting over the last couple weeks.   -Notes remote history of hysterectomy.   -UA unremarkable and urine cx with insignificant growth. Will stop further abx -Vaginal US  reviewed. Findings of 4.3x2.7cm cyst and 3.1x2.3cm cysts in R ovary -Recommendations are for f/u US  in 6-12 weeks to assess the resolution of the cysts   Electrolyte disturbance Hyponatremia Hypokalemia Hypomagnesemia Hypocalcemia Acute on chronic.  Initial labs noted sodium 134, potassium 2.7, calcium  8.4, and magnesium  1.4.  Patient noted to have normal electrolytes on occasion based off of lab work. -electrolytes now corrected   Asthma, without acute exacerbation, RSV pos Cough Patient reports having an intermittently productive cough has been present over the last 5 days.  On physical exam lung sounds clear without significant wheezing. -resp viral panel pos for RSV -CXR reviewed, neg -Albuterol  nebs as needed for shortness of breath/wheezing -Mucinex    Acute Renal insufficiency Chronic.  Presented wiith Creatinine 1.14 with BUN 12 which appears around patient's baseline. -Cr up to 1.46 today -continue NS at 75cc/hr x 12 hrs -recheck bmet in AM   Normocytic anemia -Hgb normalized    Crohn's disease, in remission Patient denies having any significant episodes of diarrhea.   Subclinical hypothyroidism Labs from 10/25/2022 noted TSH- 6.62 and free T4-1.1. -  TSH 7.148, Free T4 within normal  limits   Overweight BMI 29.41 kg/m  Leukocytosis -WBC of 16.5 this AM -afebrile -Suspect is related to burst prednisone  that pt received earlier -recheck cbc in AM   Subjective: Feeling somewhat better today  Physical Exam: Vitals:   04/22/23 1458 04/22/23 1517 04/22/23 1623 04/22/23 1629  BP: (!) 193/122 (!) 188/98 (!) 175/105 (!) 175/104  Pulse:  98 87 82  Resp:    19  Temp:    98.3 F (36.8 C)  TempSrc:    Oral  SpO2:    100%  Weight:      Height:       General exam: Awake, laying in bed, in nad Respiratory system: Normal respiratory effort, no wheezing Cardiovascular system: regular rate, s1, s2 Gastrointestinal system: Soft, nondistended, positive BS Central nervous system: CN2-12 grossly intact, 4/5 strength in RUE and RLE Extremities: Perfused, no clubbing Skin: Normal skin turgor, no notable skin lesions seen Psychiatry: Mood normal // no visual hallucinations   Data Reviewed:  Labs reviewed: Na 134, K 3.8, Cr 1.46, WBC 16.5, hgb 12.4  Family Communication: Pt in room, family not at bedside  Disposition: Status is: Inpatient Remains inpatient appropriate because: severity of illness  Planned Discharge Destination: Home    Author: Garnette Pelt, MD 04/22/2023 5:10 PM  For on call review [email or web address].

## 2023-04-22 NOTE — Progress Notes (Addendum)
 STROKE TEAM PROGRESS NOTE   SUBJECTIVE (INTERVAL HISTORY) No family is at the bedside. Pt lying in bed, neuro stable. PT and OT recommend outpt. Her BP still high, will need to restart BP meds and normalize in 2-3 days.     OBJECTIVE Temp:  [97.5 F (36.4 C)-98.5 F (36.9 C)] 97.6 F (36.4 C) (01/05 0501) Pulse Rate:  [75-90] 90 (01/05 0501) Cardiac Rhythm: Normal sinus rhythm (01/05 0700) Resp:  [16-19] 18 (01/05 0501) BP: (171-193)/(107-122) 193/122 (01/05 1458) SpO2:  [89 %-98 %] 93 % (01/05 0501)  No results for input(s): GLUCAP in the last 168 hours. Recent Labs  Lab 04/20/23 1448 04/21/23 0724 04/22/23 0639  NA 134* 134* 134*  K 2.7* 3.1* 3.8  CL 99 99 98  CO2 27 24 23   GLUCOSE 125* 114* 159*  BUN 12 6 19   CREATININE 1.14* 1.04* 1.46*  CALCIUM  8.4* 8.2* 9.5  MG 1.4*  --   --    No results for input(s): AST, ALT, ALKPHOS, BILITOT, PROT, ALBUMIN in the last 168 hours. Recent Labs  Lab 04/20/23 1448 04/22/23 0639  WBC 9.3 16.5*  HGB 11.6* 12.4  HCT 35.7* 37.3  MCV 81.5 79.9*  PLT 222 261   No results for input(s): CKTOTAL, CKMB, CKMBINDEX, TROPONINI in the last 168 hours. No results for input(s): LABPROT, INR in the last 72 hours. Recent Labs    04/20/23 1842 04/21/23 1023  COLORURINE YELLOW YELLOW  LABSPEC 1.020 >1.046*  PHURINE 7.0 6.0  GLUCOSEU NEGATIVE NEGATIVE  HGBUR TRACE* NEGATIVE  BILIRUBINUR NEGATIVE NEGATIVE  KETONESUR NEGATIVE NEGATIVE  PROTEINUR 30* 30*  NITRITE NEGATIVE NEGATIVE  LEUKOCYTESUR MODERATE* NEGATIVE       Component Value Date/Time   CHOL 208 (H) 04/21/2023 0724   TRIG 117 04/21/2023 0724   HDL 55 04/21/2023 0724   CHOLHDL 3.8 04/21/2023 0724   VLDL 23 04/21/2023 0724   LDLCALC 130 (H) 04/21/2023 0724   No results found for: HGBA1C    Component Value Date/Time   LABOPIA NONE DETECTED 04/21/2023 1023   COCAINSCRNUR NONE DETECTED 04/21/2023 1023   LABBENZ POSITIVE (A) 04/21/2023 1023    AMPHETMU NONE DETECTED 04/21/2023 1023   THCU NONE DETECTED 04/21/2023 1023   LABBARB NONE DETECTED 04/21/2023 1023    No results for input(s): ETH in the last 168 hours.  I have personally reviewed the radiological images below and agree with the radiology interpretations.  ECHOCARDIOGRAM COMPLETE Result Date: 04/21/2023    ECHOCARDIOGRAM REPORT   Patient Name:   Colleen Baker Date of Exam: 04/21/2023 Medical Rec #:  [id number]   Height:       70.0 in Accession #:    [id number]  Weight:       205.0 lb Date of Birth:  07-01-1975   BSA:          2.109 m Patient Age:    47 years    BP:           174/119 mmHg Patient Gender: F           HR:           85 bpm. Exam Location:  Inpatient Procedure: 2D Echo, Cardiac Doppler and Color Doppler Indications:    Stroke I63.9  History:        Patient has no prior history of Echocardiogram examinations.                 Stroke; Risk Factors:Hypertension.  Sonographer:    Thea Norlander  RCS Referring Phys: LOLA CROME BHAGAT IMPRESSIONS  1. Left ventricular ejection fraction, by estimation, is >75%. The left ventricle has hyperdynamic function. The left ventricle has no regional wall motion abnormalities. There is mild left ventricular hypertrophy. Left ventricular diastolic parameters were normal.  2. Right ventricular systolic function is normal. The right ventricular size is normal.  3. The mitral valve is normal in structure. No evidence of mitral valve regurgitation. No evidence of mitral stenosis.  4. The aortic valve was not well visualized. Aortic valve regurgitation is not visualized. No aortic stenosis is present.  5. The inferior vena cava is normal in size with greater than 50% respiratory variability, suggesting right atrial pressure of 3 mmHg. Comparison(s): No prior Echocardiogram. Conclusion(s)/Recommendation(s): No intracardiac source of embolism detected on this transthoracic study. Consider a transesophageal echocardiogram to exclude cardiac source of  embolism if clinically indicated. FINDINGS  Left Ventricle: Left ventricular ejection fraction, by estimation, is >75%. The left ventricle has hyperdynamic function. The left ventricle has no regional wall motion abnormalities. The left ventricular internal cavity size was normal in size. There is mild left ventricular hypertrophy. Left ventricular diastolic parameters were normal. Right Ventricle: The right ventricular size is normal. No increase in right ventricular wall thickness. Right ventricular systolic function is normal. Left Atrium: Left atrial size was normal in size. Right Atrium: Right atrial size was normal in size. Pericardium: There is no evidence of pericardial effusion. Mitral Valve: The mitral valve is normal in structure. No evidence of mitral valve regurgitation. No evidence of mitral valve stenosis. Tricuspid Valve: The tricuspid valve is normal in structure. Tricuspid valve regurgitation is trivial. No evidence of tricuspid stenosis. Aortic Valve: The aortic valve was not well visualized. Aortic valve regurgitation is not visualized. No aortic stenosis is present. Aortic valve peak gradient measures 9.9 mmHg. Pulmonic Valve: The pulmonic valve was normal in structure. Pulmonic valve regurgitation is trivial. No evidence of pulmonic stenosis. Aorta: The aortic root and ascending aorta are structurally normal, with no evidence of dilitation. Venous: The inferior vena cava is normal in size with greater than 50% respiratory variability, suggesting right atrial pressure of 3 mmHg. IAS/Shunts: There is redundancy of the interatrial septum. The atrial septum is grossly normal.  LEFT VENTRICLE PLAX 2D LVIDd:         4.00 cm   Diastology LVIDs:         2.60 cm   LV e' medial:    5.00 cm/s LV PW:         1.20 cm   LV E/e' medial:  15.3 LV IVS:        1.10 cm   LV e' lateral:   7.07 cm/s LVOT diam:     2.10 cm   LV E/e' lateral: 10.8 LV SV:         70 LV SV Index:   33 LVOT Area:     3.46 cm  RIGHT  VENTRICLE             IVC RV S prime:     17.30 cm/s  IVC diam: 1.20 cm TAPSE (M-mode): 2.4 cm LEFT ATRIUM             Index        RIGHT ATRIUM          Index LA diam:        3.40 cm 1.61 cm/m   RA Area:     8.86 cm LA Vol (A2C):   36.0 ml  17.07 ml/m  RA Volume:   16.10 ml 7.63 ml/m LA Vol (A4C):   44.5 ml 21.10 ml/m LA Biplane Vol: 39.5 ml 18.73 ml/m  AORTIC VALVE AV Area (Vmax): 2.51 cm AV Vmax:        157.00 cm/s AV Peak Grad:   9.9 mmHg LVOT Vmax:      114.00 cm/s LVOT Vmean:     74.400 cm/s LVOT VTI:       0.203 m  AORTA Ao Root diam: 3.00 cm Ao Asc diam:  3.10 cm MITRAL VALVE MV Area (PHT): 3.91 cm     SHUNTS MV Decel Time: 194 msec     Systemic VTI:  0.20 m MV E velocity: 76.50 cm/s   Systemic Diam: 2.10 cm MV A velocity: 111.00 cm/s MV E/A ratio:  0.69 Sunit Tolia Electronically signed by Madonna Large Signature Date/Time: 04/21/2023/5:28:49 PM    Final    US  PELVIS TRANSVAGINAL NON-OB (TV ONLY) Result Date: 04/21/2023 CLINICAL DATA:  Vaginal spotting EXAM: ULTRASOUND PELVIS TRANSVAGINAL TECHNIQUE: Transvaginal ultrasound examination of the pelvis was performed including evaluation of the uterus, ovaries, adnexal regions, and pelvic cul-de-sac. COMPARISON:  None Available. FINDINGS: Uterus Status post hysterectomy Endometrium Not applicable Right ovary Measurements: 5.1 x 3.1 x 3.9 cm = volume: 32.0 mL. There are 2 dominant subjacent cysts versus single large septated cyst. Collectively, these measure 4.3 x 2.7 cm. The dominant cystic component measures 3.1 x 2.3 cm. Left ovary Not visualized. Other findings:  No abnormal free fluid IMPRESSION: 1. Status post hysterectomy. 2. Nonvisualization of the left ovary. 3. There are 2 dominant subjacent cysts versus single large septated cyst within the right ovary. Collectively, these measure 4.3 x 2.7 cm. The dominant cystic component measures 3.1 x 2.3 cm. Recommend follow-up ultrasound in 6-12 weeks to assess for resolution of the cysts. Electronically  Signed   By: Waddell Calk M.D.   On: 04/21/2023 11:32   DG CHEST PORT 1 VIEW Result Date: 04/21/2023 CLINICAL DATA:  48 year old female with history of weakness for the past 3 days. EXAM: PORTABLE CHEST 1 VIEW COMPARISON:  Chest x-ray 06/28/2022. FINDINGS: Lung volumes are normal. No consolidative airspace disease. No pleural effusions. No pneumothorax. No evidence of pulmonary edema. Heart size is normal. The patient is rotated to the right on today's exam, resulting in distortion of the mediastinal contours and reduced diagnostic sensitivity and specificity for mediastinal pathology. IMPRESSION: 1. No radiographic evidence of acute cardiopulmonary disease. Electronically Signed   By: Toribio Aye M.D.   On: 04/21/2023 08:17   CT ANGIO HEAD NECK W WO CM Result Date: 04/21/2023 CLINICAL DATA:  Stroke, determine embolic source. EXAM: CT ANGIOGRAPHY HEAD AND NECK WITH AND WITHOUT CONTRAST TECHNIQUE: Multidetector CT imaging of the head and neck was performed using the standard protocol during bolus administration of intravenous contrast. Multiplanar CT image reconstructions and MIPs were obtained to evaluate the vascular anatomy. Carotid stenosis measurements (when applicable) are obtained utilizing NASCET criteria, using the distal internal carotid diameter as the denominator. RADIATION DOSE REDUCTION: This exam was performed according to the departmental dose-optimization program which includes automated exposure control, adjustment of the mA and/or kV according to patient size and/or use of iterative reconstruction technique. CONTRAST:  75mL OMNIPAQUE  IOHEXOL  350 MG/ML SOLN COMPARISON:  Brain MRI from earlier today FINDINGS: CT HEAD FINDINGS Brain: Known insult at the left corona radiata by prior brain MRI. No hemorrhage or visible progression. No gray matter infarct detected. Vascular: See below Skull: Normal. Negative for  fracture or focal lesion. Sinuses/Orbits: Negative Review of the MIP images  confirms the above findings CTA NECK FINDINGS Aortic arch: Normal appearance of the arch. Three vessel branching with great vessels partially obscured by streak artifact. Right carotid system: Vessels are smoothly contoured and widely patent. No significant atheromatous plaque. Left carotid system: Vessels are smoothly contoured and widely patent. No significant atheromatous plaque. There is obscured proximal common carotid due to streak artifact from intravenous contrast. Vertebral arteries: The vertebral and subclavian arteries are widely patent and smoothly contoured. Dominant right vertebral artery. Skeleton: Negative. Other neck: No significant finding Upper chest: Clear apical lungs Review of the MIP images confirms the above findings CTA HEAD FINDINGS Anterior circulation: No significant stenosis, proximal occlusion, aneurysm, or vascular malformation. Luminal vessel irregularity affecting medium size branches on thick MIPS could be technical or from atherosclerosis. Posterior circulation: The vertebral and basilar arteries are smoothly contoured and diffusely patent. No aneurysm or beading. Venous sinuses: Unremarkable for arterial timing Anatomic variants: None significant Review of the MIP images confirms the above findings IMPRESSION: 1. No emergent arterial finding. 2. Some irregularity of medium size intracranial branches on thick MIPS could be a real finding or technical artifact. Are there risk factors for premature atherosclerosis or vasculopathy such as RCVS? Electronically Signed   By: Dorn Roulette M.D.   On: 04/21/2023 07:47   MR BRAIN WO CONTRAST Result Date: 04/21/2023 CLINICAL DATA:  Right-sided upper and lower extremity weakness. Claustrophobia. Evaluate for demyelinating disease. EXAM: MRI HEAD WITHOUT CONTRAST MRI CERVICAL SPINE WITHOUT CONTRAST TECHNIQUE: Multiplanar, multiecho pulse sequences of the brain and surrounding structures, and cervical spine, to include the craniocervical  junction and cervicothoracic junction, were obtained without intravenous contrast. Contrast was desired, and in fact injected but no postcontrast images due to presumed extravasation. COMPARISON:  Head CT from yesterday FINDINGS: MRI HEAD FINDINGS Brain: Ovoid restricted diffusion in the left corona radiata measuring 12 mm on coronal acquisition. There is mention of concern for demyelinating disease and acute demyelinating plaque is possible, although the shape and historical acuity favors small vessel infarct. There are other areas of remote white matter insult which are mild in extent and nonspecific for demyelinating disease. No infratentorial signal abnormality. No hydrocephalus, hemorrhage, or masslike finding. Vascular: Major flow voids are preserved Skull and upper cervical spine: Normal marrow signal Sinuses/Orbits: Negative MRI CERVICAL SPINE FINDINGS Alignment: Normal Vertebrae: No fracture, evidence of discitis, or bone lesion. Cord: Normal signal and morphology. Posterior Fossa, vertebral arteries, paraspinal tissues: Unremarkable Disc levels: No significant degenerative change. No neural impingement. IMPRESSION: Noncontrast studies due to presumed extravasation of the injected contrast. Brain MRI: 12 mm lesion in the left corona radiata with restricted diffusion, favor acute infarct over demyelinating plaque. Chronic white matter disease is mild with nonspecific pattern. Cervical MRI: Normal MRI of the cervical cord. Electronically Signed   By: Dorn Roulette M.D.   On: 04/21/2023 05:43   MR CERVICAL SPINE WO CONTRAST Result Date: 04/21/2023 CLINICAL DATA:  Right-sided upper and lower extremity weakness. Claustrophobia. Evaluate for demyelinating disease. EXAM: MRI HEAD WITHOUT CONTRAST MRI CERVICAL SPINE WITHOUT CONTRAST TECHNIQUE: Multiplanar, multiecho pulse sequences of the brain and surrounding structures, and cervical spine, to include the craniocervical junction and cervicothoracic junction,  were obtained without intravenous contrast. Contrast was desired, and in fact injected but no postcontrast images due to presumed extravasation. COMPARISON:  Head CT from yesterday FINDINGS: MRI HEAD FINDINGS Brain: Ovoid restricted diffusion in the left corona radiata measuring 12 mm on  coronal acquisition. There is mention of concern for demyelinating disease and acute demyelinating plaque is possible, although the shape and historical acuity favors small vessel infarct. There are other areas of remote white matter insult which are mild in extent and nonspecific for demyelinating disease. No infratentorial signal abnormality. No hydrocephalus, hemorrhage, or masslike finding. Vascular: Major flow voids are preserved Skull and upper cervical spine: Normal marrow signal Sinuses/Orbits: Negative MRI CERVICAL SPINE FINDINGS Alignment: Normal Vertebrae: No fracture, evidence of discitis, or bone lesion. Cord: Normal signal and morphology. Posterior Fossa, vertebral arteries, paraspinal tissues: Unremarkable Disc levels: No significant degenerative change. No neural impingement. IMPRESSION: Noncontrast studies due to presumed extravasation of the injected contrast. Brain MRI: 12 mm lesion in the left corona radiata with restricted diffusion, favor acute infarct over demyelinating plaque. Chronic white matter disease is mild with nonspecific pattern. Cervical MRI: Normal MRI of the cervical cord. Electronically Signed   By: Dorn Roulette M.D.   On: 04/21/2023 05:43   CT Head Wo Contrast Result Date: 04/20/2023 CLINICAL DATA:  Neuro deficit, acute, stroke suspected. Right arm and leg weakness. EXAM: CT HEAD WITHOUT CONTRAST TECHNIQUE: Contiguous axial images were obtained from the base of the skull through the vertex without intravenous contrast. RADIATION DOSE REDUCTION: This exam was performed according to the departmental dose-optimization program which includes automated exposure control, adjustment of the mA  and/or kV according to patient size and/or use of iterative reconstruction technique. COMPARISON:  None Available. FINDINGS: Brain: There is no evidence of an acute cortically based infarct, intracranial hemorrhage, mass, midline shift, or extra-axial fluid collection. Cerebral volume is normal with normal size of the ventricles. Scattered hypodensities in the cerebral white matter bilaterally are mild in severity but abnormal for age. There is a small focus of slightly more discrete hypodensity in the left corona radiata raising the possibility of a recent lacunar infarct. Vascular: No hyperdense vessel. Skull: No acute fracture or suspicious osseous lesion. Sinuses/Orbits: Mild mucosal thickening in the paranasal sinuses. Clear mastoid air cells. Unremarkable orbits. Other: None. IMPRESSION: 1. No evidence of an acute cortically based infarct or intracranial hemorrhage. 2. Mild cerebral white matter disease, abnormal for age and nonspecific though could reflect chronic small vessel ischemia or demyelinating disease. A recent lacunar infarct is not excluded in the left corona radiata. Consider MRI for further evaluation. Electronically Signed   By: Dasie Hamburg M.D.   On: 04/20/2023 16:39     PHYSICAL EXAM  Temp:  [97.5 F (36.4 C)-98.5 F (36.9 C)] 97.6 F (36.4 C) (01/05 0501) Pulse Rate:  [75-90] 90 (01/05 0501) Resp:  [16-19] 18 (01/05 0501) BP: (171-193)/(107-122) 193/122 (01/05 1458) SpO2:  [89 %-98 %] 93 % (01/05 0501)  General - Well nourished, well developed, in no apparent distress.  Ophthalmologic - fundi not visualized due to noncooperation.  Cardiovascular - Regular rhythm and rate.  Mental Status -  Level of arousal and orientation to time, place, and person were intact. Language including expression, naming, repetition, comprehension was assessed and found intact. Attention span and concentration were normal. Recent and remote memory were intact. Fund of Knowledge was  assessed and was intact.  Cranial Nerves II - XII - II - Visual field intact OU. III, IV, VI - Extraocular movements intact. V - Facial sensation intact bilaterally. VII - Facial movement intact bilaterally. VIII - Hearing & vestibular intact bilaterally. X - Palate elevates symmetrically. XI - Chin turning & shoulder shrug intact bilaterally. XII - Tongue protrusion intact.  Motor  Strength - The patient's strength was normal in all extremities and pronator drift was absent except RLE 4+/5 proximal.  Bulk was normal and fasciculations were absent.   Motor Tone - Muscle tone was assessed at the neck and appendages and was normal.  Reflexes - The patient's reflexes were symmetrical in all extremities and she had no pathological reflexes.  Sensory - Light touch, temperature/pinprick were assessed and were symmetrical except mild light touch decreased on the RLE.    Coordination - The patient had normal movements in the hands with no ataxia or dysmetria.  Tremor was absent.  Gait and Station - deferred.   ASSESSMENT/PLAN Colleen Baker is a 48 y.o. female with history of HTN, LBP, Crohn's disease, fibroid s/p hysterectomy, occasional cigar smoker admitted for difficulty walking and RLE weakness. No TNK given due to outside window.    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 I favor technical artifact but she does have several uncontrolled risk factor for premature atherosclerosis MRI  L CR small infarct MRI C-spine - unremarkable 2D Echo EF > 75% LDL 130 HgbA1c pending UDS benzo Hypercoagulable labs pending Lovenox  for VTE prophylaxis No antithrombotic prior to admission, now on aspirin  81 mg daily and clopidogrel  75 mg daily DAPT for 3 weeks and then ASA alone.  Patient counseled to be  compliant with her antithrombotic medications Ongoing aggressive stroke risk factor management Therapy recommendations:  outpt PT Disposition:  pending  Hypertension BP high on presentation Unstable now, still high  Gradually normalize BP within 2-3 days. Long term BP goal normotensive  Hyperlipidemia Home meds:  none  LDL 130, goal < 70 Now on lipitor 40 Continue statin at discharge  Tobacco abuse Occasional cigar smoker Smoking cessation counseling provided Pt is willing to quit  Other Stroke Risk Factors Overweight, Body mass index is 29.41 kg/m. Educated on weight loss/maintenance  Other Active Problems Hypokalemia K 2.7 - supplement Hx of fibroids s/p hysterectomy LBP Crohn's disease Depression   Hospital day # 1  Neurology will sign off. Please call with questions. Pt will follow up with stroke clinic NP at [facility] in about 4 weeks. Thanks for the consult.   Ary Cummins, MD PhD Stroke Neurology 04/22/2023 3:04 PM    To contact Stroke Continuity provider, please refer to [email or web address]. After hours, contact General Neurology

## 2023-04-22 NOTE — Progress Notes (Signed)
 Physical Therapy Treatment Patient Details Name: Colleen Baker MRN: [id number] DOB: 02-Feb-1976 Today's Date: 04/22/2023   History of Present Illness Pt is a 48 y.o. female who presented 04/20/23 with R-sided weakness and ataxia. 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 PMH: eczema, Chron's disease, anemia, arthritis, GERD, heart murmur, HTN    PT Comments  Patient highly motivated. Reports walking to bathroom on her own using counter for support. Does not feel comfortable walking in hallway without assistance due to Rt hip and knee weakness. Noted rt hip trendelenburg and rt knee with slow buckle. Pt walks with UEs in guarded position and below normal velocity. Able to progress to stair training with bil rails and pt did well (modified independent). Initiated HEP with RLE single leg stance with hip in and standing rt hip abdct. Patient very appreciative of PT interventions. Instructed to do hip exercises 2x per day. (And continue walking for exercise).     If plan is discharge home, recommend the following: Assistance with cooking/housework;Assist for transportation   Can travel by private vehicle        Equipment Recommendations  None recommended by PT    Recommendations for Other Services       Precautions / Restrictions Precautions Precautions: Fall Precaution Comments: watch BP (HTN) Restrictions Weight Bearing Restrictions Per Provider Order: No     Mobility  Bed Mobility Overal bed mobility: Modified Independent             General bed mobility comments: HOB elevated, no assistance needed    Transfers Overall transfer level: Independent Equipment used: None               General transfer comment: No assistance needed, no LOB    Ambulation/Gait Ambulation/Gait assistance: Supervision Gait Distance (Feet): 120 Feet Assistive device: None Gait Pattern/deviations: Step-through pattern, Decreased stride length, Decreased dorsiflexion - right,  Trendelenburg Gait velocity: reduced Gait velocity interpretation: 1.31 - 2.62 ft/sec, indicative of limited community ambulator   General Gait Details: Pt ambulates with a reduced speed with a step-through gait pattern. No circumduction noted, however +trendelenburg on the right. in room worked on holding rt hip in as she advanced LLE with good carryover.   Stairs Stairs: Yes Stairs assistance: Modified independent (Device/Increase time) Stair Management: Two rails, Step to pattern, Forwards Number of Stairs: 5 General stair comments: pt accustomed to favoring her LLE due to left hip issues. Cued to consider her LLE her 'strong leg and required cues for sequencing x 1 step. Even when stepped down with RLE lowering her, she did not buckle   Wheelchair Mobility     Tilt Bed    Modified Rankin (Stroke Patients Only) Modified Rankin (Stroke Patients Only) Pre-Morbid Rankin Score: Slight disability (out of work due to L UE and leg injuries) Modified Rankin: Moderate disability     Balance Overall balance assessment: Mild deficits observed, not formally tested                                          Cognition Arousal: Alert Behavior During Therapy: WFL for tasks assessed/performed Overall Cognitive Status: Within Functional Limits for tasks assessed                                          Exercises  Other Exercises Other Exercises: Rt SLS for up to 30 seconds with rt hip in and hips level; light bil UE support on counter Other Exercises: standing RLE hip abduction x 20 reps; bil UE light surpport on counter    General Comments        Pertinent Vitals/Pain Pain Assessment Pain Assessment: Faces Faces Pain Scale: Hurts a little bit Pain Location: hips Pain Descriptors / Indicators: Discomfort Pain Intervention(s): Monitored during session, Limited activity within patient's tolerance    Home Living                           Prior Function            PT Goals (current goals can now be found in the care plan section) Acute Rehab PT Goals Patient Stated Goal: to improve and not have another CVA Time For Goal Achievement: 05/05/23 Potential to Achieve Goals: Good Progress towards PT goals: Progressing toward goals    Frequency    Min 1X/week      PT Plan      Co-evaluation              AM-PAC PT 6 Clicks Mobility   Outcome Measure  Help needed turning from your back to your side while in a flat bed without using bedrails?: None Help needed moving from lying on your back to sitting on the side of a flat bed without using bedrails?: None Help needed moving to and from a bed to a chair (including a wheelchair)?: None Help needed standing up from a chair using your arms (e.g., wheelchair or bedside chair)?: None Help needed to walk in hospital room?: None Help needed climbing 3-5 steps with a railing? : None 6 Click Score: 24    End of Session Equipment Utilized During Treatment: Gait belt Activity Tolerance: Patient tolerated treatment well Patient left: in bed;with call bell/phone within reach;with family/visitor present   PT Visit Diagnosis: Unsteadiness on feet (R26.81);Other abnormalities of gait and mobility (R26.89);Muscle weakness (generalized) (M62.81);Difficulty in walking, not elsewhere classified (R26.2);Other symptoms and signs involving the nervous system (R29.898);Hemiplegia and hemiparesis Hemiplegia - Right/Left: Right Hemiplegia - dominant/non-dominant: Dominant Hemiplegia - caused by: Cerebral infarction     Time: 1252-1310 PT Time Calculation (min) (ACUTE ONLY): 18 min  Charges:    $Gait Training: 8-22 mins PT General Charges $$ ACUTE PT VISIT: 1 Visit                      Macario RAMAN, PT Acute Rehabilitation Services  Office [phone]    Macario SHAUNNA Soja 04/22/2023, 1:19 PM

## 2023-04-22 NOTE — Plan of Care (Signed)
   Problem: Education: Goal: Knowledge of disease or condition will improve Outcome: Progressing

## 2023-04-23 DIAGNOSIS — I639 Cerebral infarction, unspecified: Secondary | ICD-10-CM

## 2023-04-23 DIAGNOSIS — E876 Hypokalemia: Secondary | ICD-10-CM | POA: Diagnosis not present

## 2023-04-23 DIAGNOSIS — N939 Abnormal uterine and vaginal bleeding, unspecified: Secondary | ICD-10-CM | POA: Diagnosis not present

## 2023-04-23 LAB — COMPREHENSIVE METABOLIC PANEL
ALT: 17 U/L (ref 0–44)
AST: 17 U/L (ref 15–41)
Albumin: 2.9 g/dL — ABNORMAL LOW (ref 3.5–5.0)
Alkaline Phosphatase: 70 U/L (ref 38–126)
Anion gap: 9 (ref 5–15)
BUN: 19 mg/dL (ref 6–20)
CO2: 26 mmol/L (ref 22–32)
Calcium: 8.3 mg/dL — ABNORMAL LOW (ref 8.9–10.3)
Chloride: 102 mmol/L (ref 98–111)
Creatinine, Ser: 1.25 mg/dL — ABNORMAL HIGH (ref 0.44–1.00)
GFR, Estimated: 53 mL/min — ABNORMAL LOW (ref >60)
Glucose, Bld: 99 mg/dL (ref 70–99)
Potassium: 3 mmol/L — ABNORMAL LOW (ref 3.5–5.1)
Sodium: 137 mmol/L (ref 135–145)
Total Bilirubin: 0.4 mg/dL (ref 0.0–1.2)
Total Protein: 6.4 g/dL — ABNORMAL LOW (ref 6.5–8.1)

## 2023-04-23 LAB — CBC
HCT: 35.4 % — ABNORMAL LOW (ref 36.0–46.0)
Hemoglobin: 11.6 g/dL — ABNORMAL LOW (ref 12.0–15.0)
MCH: 26.7 pg (ref 26.0–34.0)
MCHC: 32.8 g/dL (ref 30.0–36.0)
MCV: 81.6 fL (ref 80.0–100.0)
Platelets: 253 10*3/uL (ref 150–400)
RBC: 4.34 MIL/uL (ref 3.87–5.11)
RDW: 14.3 % (ref 11.5–15.5)
WBC: 13 10*3/uL — ABNORMAL HIGH (ref 4.0–10.5)
nRBC: 0 % (ref 0.0–0.2)

## 2023-04-23 LAB — CARDIOLIPIN ANTIBODIES, IGG, IGM, IGA
Anticardiolipin IgA: <9 [APL'U]/mL (ref 0–11)
Anticardiolipin IgG: <9 [GPL'U]/mL (ref 0–14)
Anticardiolipin IgM: 13 [MPL'U]/mL — ABNORMAL HIGH (ref 0–12)

## 2023-04-23 LAB — HEMOGLOBIN A1C
Hgb A1c MFr Bld: 6.1 % — ABNORMAL HIGH (ref 4.8–5.6)
Mean Plasma Glucose: 128 mg/dL

## 2023-04-23 LAB — MAGNESIUM: Magnesium: 1.6 mg/dL — ABNORMAL LOW (ref 1.7–2.4)

## 2023-04-23 LAB — HOMOCYSTEINE: Homocysteine: 12.9 umol/L (ref 0.0–14.5)

## 2023-04-23 MED ORDER — LOSARTAN POTASSIUM 50 MG PO TABS: 50.0000 mg | ORAL_TABLET | Freq: Every day | ORAL | Status: DC

## 2023-04-23 MED ORDER — FERROUS SULFATE 325 (65 FE) MG PO TABS: 325.0000 mg | ORAL_TABLET | Freq: Every day | ORAL | Status: DC

## 2023-04-23 MED ORDER — POTASSIUM CHLORIDE CRYS ER 10 MEQ PO TBCR
40.0000 meq | EXTENDED_RELEASE_TABLET | ORAL | Status: AC
  Administered 2023-04-23 (×2): 40 meq via ORAL
  Filled 2023-04-23 (×2): qty 4

## 2023-04-23 MED ORDER — LOSARTAN POTASSIUM 50 MG PO TABS: 50.0000 mg | ORAL_TABLET | Freq: Every day | ORAL | 0 refills | Status: DC

## 2023-04-23 MED ORDER — FAMOTIDINE 20 MG PO TABS
20.0000 mg | ORAL_TABLET | Freq: Every morning | ORAL | Status: DC
  Administered 2023-04-23: 20 mg via ORAL
  Filled 2023-04-23: qty 1

## 2023-04-23 MED ORDER — POTASSIUM CHLORIDE CRYS ER 20 MEQ PO TBCR: 20.0000 meq | EXTENDED_RELEASE_TABLET | Freq: Every day | ORAL | Status: DC

## 2023-04-23 MED ORDER — SODIUM CHLORIDE 0.9 % IV SOLN: INTRAVENOUS | Status: DC

## 2023-04-23 MED ORDER — MOMETASONE FURO-FORMOTEROL FUM 200-5 MCG/ACT IN AERO
2.0000 | INHALATION_SPRAY | Freq: Two times a day (BID) | RESPIRATORY_TRACT | Status: DC
  Filled 2023-04-23: qty 8.8

## 2023-04-23 MED ORDER — VITAMIN D (ERGOCALCIFEROL) 1.25 MG (50000 UNIT) PO CAPS
50000.0000 [IU] | ORAL_CAPSULE | ORAL | Status: DC
  Administered 2023-04-23: 50000 [IU] via ORAL
  Filled 2023-04-23: qty 1

## 2023-04-23 MED ORDER — MAGNESIUM SULFATE 4 GM/100ML IV SOLN
4.0000 g | Freq: Once | INTRAVENOUS | Status: AC
  Administered 2023-04-23: 4 g via INTRAVENOUS
  Filled 2023-04-23: qty 100

## 2023-04-23 NOTE — TOC Transition Note (Signed)
 Transition of Care [facility]) - Discharge Note   Patient Details  Name: Colleen Baker MRN: [id number] Date of Birth: 09-27-1975  [facility]) CM/SW Contact:  Andrez JULIANNA George, RN Phone Number: 04/23/2023, 10:17 AM   Clinical Narrative:     Pt discharging home with family: mom, son (48 yo), brother (works during the daytime) Pt drives self as needed. She manages her own medications and denies any issues.  Outpatient PT/ST recommended. Pt is already active with [facility] for PT/ aquatics. CM will fax them orders for PT/ST.  Pt has transportation home.   Final next level of care: OP Rehab Barriers to Discharge: No Barriers Identified   Patient Goals and CMS Choice            Discharge Placement                       Discharge Plan and Services Additional resources added to the After Visit Summary for                                       Social Drivers of Health (SDOH) Interventions SDOH Screenings   Food Insecurity: No Food Insecurity (04/21/2023)  Housing: Unknown (04/21/2023)  Transportation Needs: No Transportation Needs (04/21/2023)  Utilities: Not At Risk (04/21/2023)  Social Connections: Unknown (08/17/2021)   Received from [facility]  Tobacco Use: Medium Risk (04/20/2023)     Readmission Risk Interventions     No data to display

## 2023-04-23 NOTE — Plan of Care (Signed)
  Problem: Education: Goal: Knowledge of disease or condition will improve Outcome: Progressing Goal: Knowledge of secondary prevention will improve (MUST DOCUMENT ALL) Outcome: Progressing Goal: Knowledge of patient specific risk factors will improve [place] N/A or DELETE if not current risk factor) Outcome: Progressing   Problem: Ischemic Stroke/TIA Tissue Perfusion: Goal: Complications of ischemic stroke/TIA will be minimized Outcome: Progressing   Problem: Coping: Goal: Will verbalize positive feelings about self Outcome: Progressing Goal: Will identify appropriate support needs Outcome: Progressing   Problem: Health Behavior/Discharge Planning: Goal: Ability to manage health-related needs will improve Outcome: Progressing Goal: Goals will be collaboratively established with patient/family Outcome: Progressing   Problem: Self-Care: Goal: Ability to participate in self-care as condition permits will improve Outcome: Progressing Goal: Verbalization of feelings and concerns over difficulty with self-care will improve Outcome: Progressing Goal: Ability to communicate needs accurately will improve Outcome: Progressing   Problem: Nutrition: Goal: Risk of aspiration will decrease Outcome: Progressing Goal: Dietary intake will improve Outcome: Progressing

## 2023-04-23 NOTE — Discharge Summary (Signed)
 Physician Discharge Summary  Colleen Baker FMW:[id number] DOB: 07-25-1975 DOA: 04/20/2023  PCP: Pcp, No  Admit date: 04/20/2023 Discharge date: 04/23/2023  Time spent: 60 minutes  Recommendations for Outpatient Follow-up:  Follow-up with [facility] in 1 month. Follow-up with PCP in 1 to 2 weeks.  On follow-up patient will need a basic metabolic profile, magnesium  level done to follow-up on electrolytes and renal function.  Patient's blood pressure need to be reassessed for further management of antihypertensive medications.  HCTZ discontinued on discharge.  Patient will need repeat thyroid function studies done in about 4 weeks.  Patient will also need repeat transvaginal ultrasound done in 6 to 12 weeks to follow-up on ovarian cyst noted. Follow-up in outpatient rehab.   Discharge Diagnoses:  Principal Problem:   Ischemic stroke [facility]) Active Problems:   Urinary tract infection   Vaginal spotting   Hyponatremia   Hypokalemia   Hypocalcemia   Hypomagnesemia   Cough   Asthma without acute exacerbation   Renal insufficiency   Crohn's disease (HCC)   Subclinical hypothyroidism   Overweight (BMI 25.0-29.9)   Acute CVA (cerebrovascular accident) [facility])   Discharge Condition: Stable and improved.  Diet recommendation: Heart healthy  Filed Weights   04/20/23 1433 04/20/23 1810  Weight: 93 kg 93 kg    History of present illness:  HPI per Dr. Claudene Baker Clause is a 48 y.o. female with medical history significant of hypertension, heart murmur, asthma, Crohn's disease in remission, chronic back pain, subclinical hypothyroidism, anxiety, depression, GERD, and tobacco use presents with right-sided weakness. The symptoms began 2 days ago, upon waking up to use the bathroom before sunrise.  At night she went to sleep in her normal state of health.  After getting up the patient noticed difficulty walking and manipulating objects with her right hand. There were no associated  changes in speech, vision, or facial drooping. The patient denied any significant headache at the time but reported a history of frontal headaches, similar to prior episodes. The patient also reported a slight increase in heart rate, which was attributed to anxiety.   In addition to the weakness, the patient reported a recent onset of a cough that started about 5 days ago, with no colored expectoration. The patient also reported intermittent vaginal spotting over the past couple weeks, despite having had a hysterectomy many years ago. The patient denied any urinary symptoms such as burning or frequency.  She denies having any recent fever chest pain, shortness of breath, nausea, vomiting, or diarrhea symptoms.   The patient's family history is significant for heart disease in the mother, who had stents placed, and kidney issues in the father, who also had Parkinson's disease. The patient denied any family history of early stroke or clotting disorder to her knowledge.  Due to the persistence of her symptoms she had been initially evaluated at [facility] yesterday and travel by private vehicle to the hospital.   Upon admission into the emergency department patient was noted to be afebrile with mild tachypnea, blood pressures elevated up to 208/114, and all other vital signs relatively within normal limits.  Labs significant for hemoglobin 11.6, sodium 134, potassium 2.7, BUN 12, creatinine 1.14, calcium  8.4, and magnesium  1.4. CT scan of the head noted no acute stroke, but did note mild cerebral white matter disease that was abnormal for patient's age.  Patient had been seen by neurology and not a candidate for thrombolytics due to being out of the window.  Urinalysis noted  trace hemoglobin, moderate leukocytes, few bacteria, and 21-50 WBCs. Subsequent MRI of the brain was obtained which noted 12 mm lesion in the left corona radiata with restricted diffusion favoring acute infarct over demyelinating  plaque.  Patient had been given potassium chloride  60 meq p.o., 2 g of magnesium  sulfate IV, aspirin , and Plavix .  Neurology recommended completion of stroke workup including hypercoagulable panel.     Hospital Course:  CVA -Patient presents with right-sided weakness which started 2 days PTA   -Patient admitted for stroke workup. -CT head obtained with no acute findings. -CT angiogram head and neck obtained with some irregularity of medium size intracranial branches on thick MIPS could be a real finding or technical artifact.  Per neurology possibly technical artifact but patient did have several uncontrolled risk factor for premature atherosclerosis. -MRI of the brain was obtained which noted 12 mm lesion in the left corona radiata with restricted diffusion favoring acute infarct over demyelinating plaque.   -2D echo done with a EF > 75% and no intracardiac source of emboli noted. -LDL noted at 130. -UDS was positive for benzos. -Hemoglobin A1c noted at 6.1. -Neurology was consulted and followed the patient during the hospitalization.  -Patient was also started on aspirin  and Plavix . -Pt was seen by Stroke team. Recs for DAPT with Aspirin  81mg  and plavix  75mg  daily x 3 weeks and then ASA alone. -Patient improved clinically. -Outpatient follow-up with neurology.   Uncontrolled HTN -Discussed with Neurology. Plan to resume home bp meds -have resumed all bp meds with exception of hydrochlorothiazide given concurrent IVF per below and electrolyte abnormalities. -Patient placed back on home regimen of Norvasc  10 mg daily, Toprol -XL 100 mg daily and initially started on Cozaar  25 mg daily and dose uptitrated to 50 mg daily on day of discharge. -Outpatient follow-up with PCP for further blood pressure management and normalization.   Urinary tract infection ruled out Vaginal Spotting  -Patient reports intermittent vaginal spotting over the last couple weeks.   -Notes remote history of  hysterectomy.   -UA unremarkable and urine cx with insignificant growth. Will stop further abx -Vaginal US  reviewed. Findings of 4.3x2.7cm cyst and 3.1x2.3cm cysts in R ovary -Recommendations are for f/u US  in 6-12 weeks to assess the resolution of the cysts   Electrolyte disturbance Hyponatremia Hypokalemia Hypomagnesemia Hypocalcemia Acute on chronic.  Initial labs noted sodium 134, potassium 2.7, calcium  8.4, and magnesium  1.4.  Patient noted to have normal electrolytes on occasion based off of lab work. -Patient also noted to be on HCTZ prior to admission which was discontinued on discharge. -Electrolytes repleted during the hospitalization. -Patient noted on day of discharge, potassium of 3.0 and given a total of 80 mEq of potassium. -Magnesium  noted at 1.6 on day of discharge and patient received 4 g of IV magnesium . -Close outpatient follow-up with PCP 1 week postdischarge for repeat labs including magnesium  level, basic metabolic profile.   Asthma, without acute exacerbation, RSV pos Cough Patient reported having an intermittently productive cough has been present over the last 5 days.  On physical exam lung sounds clear without significant wheezing. -resp viral panel pos for RSV -CXR reviewed, neg -Albuterol  nebs as needed for shortness of breath/wheezing -Patient also placed on Mucinex . -Patient was treated with supportive care. -Patient improved clinically, was not hypoxic with sats of 100% on room air by day of discharge. -Outpatient follow-up with PCP.   Acute Renal insufficiency Chronic.  Presented wiith Creatinine 1.14 with BUN 12 which appears around patient's baseline. -  Cr up to 1.46 on 04/22/2023. -Patient hydrated with IV fluids with improvement with renal function down to 1.25 by day of discharge. -Patient is HCTZ discontinued on discharge. -Outpatient follow-up with PCP.   Normocytic anemia -Hgb remained stable at 11.6 by day of discharge.     Crohn's disease,  in remission Patient denies having any significant episodes of diarrhea. -Patient maintained on home regimen of Lialda .   Subclinical hypothyroidism Labs from 10/25/2022 noted TSH- 6.62 and free T4-1.1. -TSH 7.148, Free T4 within normal limits -Outpatient follow-up with PCP.   Overweight BMI 29.41 kg/m Lifestyle modification Patient follow-up with PCP.   Leukocytosis -WBC of 16.5 and felt likely related to a burst of prednisone  that patient received earlier on during the hospitalization.   -Patient remained afebrile.   -Leukocytosis trended down.   -Outpatient follow-up with PCP.  Procedures: CT head 04/20/2023 CT angiogram head and neck 04/21/2023 Chest x-ray 04/21/2023 MRI brain/MRI C-spine 04/21/2023 Transvaginal ultrasound 04/21/2023 2D echo 04/21/2023   Consultations: Neurology: Dr. Jerrie 04/21/2023  Discharge Exam: Vitals:   04/23/23 0921 04/23/23 1205  BP: (!) 176/106 (!) 160/105  Pulse: 75 68  Resp: 17 17  Temp: 97.7 F (36.5 C) 98.5 F (36.9 C)  SpO2: 100% 100%    General: NAD Cardiovascular: Regular rate rhythm no murmurs rubs or gallops.  No JVD.  No lower extremity edema. Respiratory: Clear to auscultation bilaterally.  No wheezes, no crackles, no rhonchi.  Fair air movement.  Speaking in full sentences.  Discharge Instructions   Discharge Instructions     Ambulatory referral to Neurology   Complete by: As directed    Follow up with stroke clinic NP (Jessica Vanschaick or Elveria Lunger, if both not available, consider Sethi, Penumali, or Ahern) at [facility] in about 4 weeks. Thanks.   Ambulatory referral to Physical Therapy   Complete by: As directed    Ambulatory referral to Physical Therapy   Complete by: As directed    Ambulatory referral to Speech Therapy   Complete by: As directed    Diet - low sodium heart healthy   Complete by: As directed    Increase activity slowly   Complete by: As directed       Allergies as of 04/23/2023       Reactions    Aciphex [rabeprazole Sodium] Hives   Lisinopril Swelling, Other (See Comments)   Facial swelling   Iodinated Contrast Media Hives, Itching, Other (See Comments)   Contrast dye        Medication List     STOP taking these medications    flunisolide  25 MCG/ACT (0.025%) Soln Commonly known as: NASALIDE    hydrochlorothiazide 25 MG tablet Commonly known as: HYDRODIURIL       TAKE these medications    albuterol  108 (90 Base) MCG/ACT inhaler Commonly known as: VENTOLIN  HFA INHALE 2 PUFFS INTO THE LUNGS EVERY 4 HOURS AS NEEDED FOR WHEEING OR SHORTNESS OF BREATH What changed: See the new instructions.   amLODipine  10 MG tablet Commonly known as: NORVASC  Take 10 mg by mouth daily.   aspirin  EC 81 MG tablet Take 1 tablet (81 mg total) by mouth daily. Swallow whole.   atorvastatin  40 MG tablet Commonly known as: LIPITOR Take 1 tablet (40 mg total) by mouth daily. What changed:  medication strength how much to take   azelastine  0.1 % nasal spray Commonly known as: ASTELIN  Place 1-2 sprays into both nostrils 2 (two) times daily as needed. What changed: reasons to take  this   budesonide -formoterol  160-4.5 MCG/ACT inhaler Commonly known as: Symbicort  Take 2 puffs 3-7 days a week for control of asthma symptoms   cloNIDine 0.1 MG tablet Commonly known as: CATAPRES Take 0.1 mg by mouth daily as needed (if B/P is greater than 160/100).   clopidogrel  75 MG tablet Commonly known as: PLAVIX  Take 1 tablet (75 mg total) by mouth daily for 21 days.   Dupixent  300 MG/2ML prefilled syringe Generic drug: dupilumab  Inject 600 mg into the skin once for 1 dose. Then 300mg  every 14 days What changed:  how much to take when to take this additional instructions   escitalopram  10 MG tablet Commonly known as: LEXAPRO  Take 10 mg by mouth daily.   famotidine  20 MG tablet Commonly known as: PEPCID  Take 1 tablet (20 mg total) by mouth daily. What changed: when to take this    fluticasone  50 MCG/ACT nasal spray Commonly known as: FLONASE  Place 2 sprays into both nostrils daily as needed for allergies or rhinitis.   guaiFENesin  600 MG 12 hr tablet Commonly known as: MUCINEX  Take 1 tablet (600 mg total) by mouth 2 (two) times daily.   H-E-B inControl BP Monitor Kit 1 Device by Does not apply route 3 (three) times daily.   hydrocortisone  2.5 % ointment Apply topically twice daily as need to red sandpapery rash. What changed:  how much to take how to take this when to take this reasons to take this additional instructions   Iron High-Potency 325 MG Tabs Take 325 mg by mouth daily with breakfast.   levocetirizine 5 MG tablet Commonly known as: XYZAL  Take up to 2 tablets twice daily as needed for hives and swelling What changed:  how much to take how to take this when to take this reasons to take this additional instructions   losartan  50 MG tablet Commonly known as: COZAAR  Take 1 tablet (50 mg total) by mouth daily. What changed:  medication strength how much to take   mesalamine  1.2 g EC tablet Commonly known as: LIALDA  Take 2.4 g by mouth daily with breakfast.   metoprolol  succinate 100 MG 24 hr tablet Commonly known as: TOPROL -XL Take 100 mg by mouth daily. Take with or immediately following a meal.   montelukast  10 MG tablet Commonly known as: SINGULAIR  Take 1 tablet (10 mg total) by mouth at bedtime.   pantoprazole  40 MG tablet Commonly known as: PROTONIX  Take 40 mg by mouth 2 (two) times daily before a meal.   potassium chloride  SA 20 MEQ tablet Commonly known as: KLOR-CON  M Take 1 tablet (20 mEq total) by mouth daily for 7 days.   tacrolimus  0.1 % ointment Commonly known as: PROTOPIC  Use topically twice daily as needed for red, sandpapery rash What changed:  how much to take how to take this when to take this additional instructions   triamcinolone  cream 0.1 % Commonly known as: KENALOG  Apply 1 Application  topically 2 (two) times daily as needed (itching). Do not use on face, groin or arm pits.   Vitamin D  (Ergocalciferol ) 1.25 MG (50000 UNIT) Caps capsule Commonly known as: DRISDOL  Take 50,000 Units by mouth every 7 (seven) days.       Allergies  Allergen Reactions   Aciphex [Rabeprazole Sodium] Hives   Lisinopril Swelling and Other (See Comments)    Facial swelling   Iodinated Contrast Media Hives, Itching and Other (See Comments)    Contrast dye    Follow-up Information     [facility]. Schedule an appointment as soon as possible for a visit in 1 month(s).   Specialty: Neurology Why: stroke clinic Contact information: [address] [place]  [phone]        Follow up with your PCP in 1-2 weeks. Schedule an appointment as soon as possible for a visit.   Why: Hospital follow up        [facility] Follow up.   Why: The outpatient rehab will contact you for the first appointment Contact information: [address] [place], [place] [address]  Phone: [phone]                 The results of significant diagnostics from this hospitalization (including imaging, microbiology, ancillary and laboratory) are listed below for reference.    Significant Diagnostic Studies: ECHOCARDIOGRAM COMPLETE Result Date: 04/21/2023    ECHOCARDIOGRAM REPORT   Patient Name:   Colleen Baker Date of Exam: 04/21/2023 Medical Rec #:  [id number]   Height:       70.0 in Accession #:    [id number]  Weight:       205.0 lb Date of Birth:  12-Nov-1975   BSA:          2.109 m Patient Age:    47 years    BP:           174/119 mmHg Patient Gender: F           HR:           85 bpm. Exam Location:  Inpatient Procedure: 2D Echo, Cardiac Doppler and Color Doppler Indications:    Stroke I63.9  History:        Patient has no prior history of Echocardiogram examinations.                 Stroke; Risk Factors:Hypertension.   Sonographer:    Thea Norlander RCS Referring Phys: LOLA LITTIE JERNIGAN IMPRESSIONS  1. Left ventricular ejection fraction, by estimation, is >75%. The left ventricle has hyperdynamic function. The left ventricle has no regional wall motion abnormalities. There is mild left ventricular hypertrophy. Left ventricular diastolic parameters were normal.  2. Right ventricular systolic function is normal. The right ventricular size is normal.  3. The mitral valve is normal in structure. No evidence of mitral valve regurgitation. No evidence of mitral stenosis.  4. The aortic valve was not well visualized. Aortic valve regurgitation is not visualized. No aortic stenosis is present.  5. The inferior vena cava is normal in size with greater than 50% respiratory variability, suggesting right atrial pressure of 3 mmHg. Comparison(s): No prior Echocardiogram. Conclusion(s)/Recommendation(s): No intracardiac source of embolism detected on this transthoracic study. Consider a transesophageal echocardiogram to exclude cardiac source of embolism if clinically indicated. FINDINGS  Left Ventricle: Left ventricular ejection fraction, by estimation, is >75%. The left ventricle has hyperdynamic function. The left ventricle has no regional wall motion abnormalities. The left ventricular internal cavity size was normal in size. There is mild left ventricular hypertrophy. Left ventricular diastolic parameters were normal. Right Ventricle: The right ventricular size is normal. No increase in right ventricular wall thickness. Right ventricular systolic function is normal. Left Atrium: Left atrial size was normal in size. Right Atrium: Right atrial size was normal in size. Pericardium: There is no evidence of pericardial effusion. Mitral Valve: The mitral valve is normal in structure. No evidence of mitral valve regurgitation. No evidence of mitral valve stenosis. Tricuspid Valve: The tricuspid  valve is normal in structure. Tricuspid valve  regurgitation is trivial. No evidence of tricuspid stenosis. Aortic Valve: The aortic valve was not well visualized. Aortic valve regurgitation is not visualized. No aortic stenosis is present. Aortic valve peak gradient measures 9.9 mmHg. Pulmonic Valve: The pulmonic valve was normal in structure. Pulmonic valve regurgitation is trivial. No evidence of pulmonic stenosis. Aorta: The aortic root and ascending aorta are structurally normal, with no evidence of dilitation. Venous: The inferior vena cava is normal in size with greater than 50% respiratory variability, suggesting right atrial pressure of 3 mmHg. IAS/Shunts: There is redundancy of the interatrial septum. The atrial septum is grossly normal.  LEFT VENTRICLE PLAX 2D LVIDd:         4.00 cm   Diastology LVIDs:         2.60 cm   LV e' medial:    5.00 cm/s LV PW:         1.20 cm   LV E/e' medial:  15.3 LV IVS:        1.10 cm   LV e' lateral:   7.07 cm/s LVOT diam:     2.10 cm   LV E/e' lateral: 10.8 LV SV:         70 LV SV Index:   33 LVOT Area:     3.46 cm  RIGHT VENTRICLE             IVC RV S prime:     17.30 cm/s  IVC diam: 1.20 cm TAPSE (M-mode): 2.4 cm LEFT ATRIUM             Index        RIGHT ATRIUM          Index LA diam:        3.40 cm 1.61 cm/m   RA Area:     8.86 cm LA Vol (A2C):   36.0 ml 17.07 ml/m  RA Volume:   16.10 ml 7.63 ml/m LA Vol (A4C):   44.5 ml 21.10 ml/m LA Biplane Vol: 39.5 ml 18.73 ml/m  AORTIC VALVE AV Area (Vmax): 2.51 cm AV Vmax:        157.00 cm/s AV Peak Grad:   9.9 mmHg LVOT Vmax:      114.00 cm/s LVOT Vmean:     74.400 cm/s LVOT VTI:       0.203 m  AORTA Ao Root diam: 3.00 cm Ao Asc diam:  3.10 cm MITRAL VALVE MV Area (PHT): 3.91 cm     SHUNTS MV Decel Time: 194 msec     Systemic VTI:  0.20 m MV E velocity: 76.50 cm/s   Systemic Diam: 2.10 cm MV A velocity: 111.00 cm/s MV E/A ratio:  0.69 Sunit Tolia Electronically signed by Madonna Large Signature Date/Time: 04/21/2023/5:28:49 PM    Final    US  PELVIS TRANSVAGINAL NON-OB  (TV ONLY) Result Date: 04/21/2023 CLINICAL DATA:  Vaginal spotting EXAM: ULTRASOUND PELVIS TRANSVAGINAL TECHNIQUE: Transvaginal ultrasound examination of the pelvis was performed including evaluation of the uterus, ovaries, adnexal regions, and pelvic cul-de-sac. COMPARISON:  None Available. FINDINGS: Uterus Status post hysterectomy Endometrium Not applicable Right ovary Measurements: 5.1 x 3.1 x 3.9 cm = volume: 32.0 mL. There are 2 dominant subjacent cysts versus single large septated cyst. Collectively, these measure 4.3 x 2.7 cm. The dominant cystic component measures 3.1 x 2.3 cm. Left ovary Not visualized. Other findings:  No abnormal free fluid IMPRESSION: 1. Status post hysterectomy. 2. Nonvisualization of the left ovary.  3. There are 2 dominant subjacent cysts versus single large septated cyst within the right ovary. Collectively, these measure 4.3 x 2.7 cm. The dominant cystic component measures 3.1 x 2.3 cm. Recommend follow-up ultrasound in 6-12 weeks to assess for resolution of the cysts. Electronically Signed   By: Waddell Calk M.D.   On: 04/21/2023 11:32   DG CHEST PORT 1 VIEW Result Date: 04/21/2023 CLINICAL DATA:  48 year old female with history of weakness for the past 3 days. EXAM: PORTABLE CHEST 1 VIEW COMPARISON:  Chest x-ray 06/28/2022. FINDINGS: Lung volumes are normal. No consolidative airspace disease. No pleural effusions. No pneumothorax. No evidence of pulmonary edema. Heart size is normal. The patient is rotated to the right on today's exam, resulting in distortion of the mediastinal contours and reduced diagnostic sensitivity and specificity for mediastinal pathology. IMPRESSION: 1. No radiographic evidence of acute cardiopulmonary disease. Electronically Signed   By: Toribio Aye M.D.   On: 04/21/2023 08:17   CT ANGIO HEAD NECK W WO CM Result Date: 04/21/2023 CLINICAL DATA:  Stroke, determine embolic source. EXAM: CT ANGIOGRAPHY HEAD AND NECK WITH AND WITHOUT CONTRAST  TECHNIQUE: Multidetector CT imaging of the head and neck was performed using the standard protocol during bolus administration of intravenous contrast. Multiplanar CT image reconstructions and MIPs were obtained to evaluate the vascular anatomy. Carotid stenosis measurements (when applicable) are obtained utilizing NASCET criteria, using the distal internal carotid diameter as the denominator. RADIATION DOSE REDUCTION: This exam was performed according to the departmental dose-optimization program which includes automated exposure control, adjustment of the mA and/or kV according to patient size and/or use of iterative reconstruction technique. CONTRAST:  75mL OMNIPAQUE  IOHEXOL  350 MG/ML SOLN COMPARISON:  Brain MRI from earlier today FINDINGS: CT HEAD FINDINGS Brain: Known insult at the left corona radiata by prior brain MRI. No hemorrhage or visible progression. No gray matter infarct detected. Vascular: See below Skull: Normal. Negative for fracture or focal lesion. Sinuses/Orbits: Negative Review of the MIP images confirms the above findings CTA NECK FINDINGS Aortic arch: Normal appearance of the arch. Three vessel branching with great vessels partially obscured by streak artifact. Right carotid system: Vessels are smoothly contoured and widely patent. No significant atheromatous plaque. Left carotid system: Vessels are smoothly contoured and widely patent. No significant atheromatous plaque. There is obscured proximal common carotid due to streak artifact from intravenous contrast. Vertebral arteries: The vertebral and subclavian arteries are widely patent and smoothly contoured. Dominant right vertebral artery. Skeleton: Negative. Other neck: No significant finding Upper chest: Clear apical lungs Review of the MIP images confirms the above findings CTA HEAD FINDINGS Anterior circulation: No significant stenosis, proximal occlusion, aneurysm, or vascular malformation. Luminal vessel irregularity affecting medium  size branches on thick MIPS could be technical or from atherosclerosis. Posterior circulation: The vertebral and basilar arteries are smoothly contoured and diffusely patent. No aneurysm or beading. Venous sinuses: Unremarkable for arterial timing Anatomic variants: None significant Review of the MIP images confirms the above findings IMPRESSION: 1. No emergent arterial finding. 2. Some irregularity of medium size intracranial branches on thick MIPS could be a real finding or technical artifact. Are there risk factors for premature atherosclerosis or vasculopathy such as RCVS? Electronically Signed   By: Dorn Roulette M.D.   On: 04/21/2023 07:47   MR BRAIN WO CONTRAST Result Date: 04/21/2023 CLINICAL DATA:  Right-sided upper and lower extremity weakness. Claustrophobia. Evaluate for demyelinating disease. EXAM: MRI HEAD WITHOUT CONTRAST MRI CERVICAL SPINE WITHOUT CONTRAST TECHNIQUE: Multiplanar, multiecho pulse  sequences of the brain and surrounding structures, and cervical spine, to include the craniocervical junction and cervicothoracic junction, were obtained without intravenous contrast. Contrast was desired, and in fact injected but no postcontrast images due to presumed extravasation. COMPARISON:  Head CT from yesterday FINDINGS: MRI HEAD FINDINGS Brain: Ovoid restricted diffusion in the left corona radiata measuring 12 mm on coronal acquisition. There is mention of concern for demyelinating disease and acute demyelinating plaque is possible, although the shape and historical acuity favors small vessel infarct. There are other areas of remote white matter insult which are mild in extent and nonspecific for demyelinating disease. No infratentorial signal abnormality. No hydrocephalus, hemorrhage, or masslike finding. Vascular: Major flow voids are preserved Skull and upper cervical spine: Normal marrow signal Sinuses/Orbits: Negative MRI CERVICAL SPINE FINDINGS Alignment: Normal Vertebrae: No fracture,  evidence of discitis, or bone lesion. Cord: Normal signal and morphology. Posterior Fossa, vertebral arteries, paraspinal tissues: Unremarkable Disc levels: No significant degenerative change. No neural impingement. IMPRESSION: Noncontrast studies due to presumed extravasation of the injected contrast. Brain MRI: 12 mm lesion in the left corona radiata with restricted diffusion, favor acute infarct over demyelinating plaque. Chronic white matter disease is mild with nonspecific pattern. Cervical MRI: Normal MRI of the cervical cord. Electronically Signed   By: Dorn Roulette M.D.   On: 04/21/2023 05:43   MR CERVICAL SPINE WO CONTRAST Result Date: 04/21/2023 CLINICAL DATA:  Right-sided upper and lower extremity weakness. Claustrophobia. Evaluate for demyelinating disease. EXAM: MRI HEAD WITHOUT CONTRAST MRI CERVICAL SPINE WITHOUT CONTRAST TECHNIQUE: Multiplanar, multiecho pulse sequences of the brain and surrounding structures, and cervical spine, to include the craniocervical junction and cervicothoracic junction, were obtained without intravenous contrast. Contrast was desired, and in fact injected but no postcontrast images due to presumed extravasation. COMPARISON:  Head CT from yesterday FINDINGS: MRI HEAD FINDINGS Brain: Ovoid restricted diffusion in the left corona radiata measuring 12 mm on coronal acquisition. There is mention of concern for demyelinating disease and acute demyelinating plaque is possible, although the shape and historical acuity favors small vessel infarct. There are other areas of remote white matter insult which are mild in extent and nonspecific for demyelinating disease. No infratentorial signal abnormality. No hydrocephalus, hemorrhage, or masslike finding. Vascular: Major flow voids are preserved Skull and upper cervical spine: Normal marrow signal Sinuses/Orbits: Negative MRI CERVICAL SPINE FINDINGS Alignment: Normal Vertebrae: No fracture, evidence of discitis, or bone lesion.  Cord: Normal signal and morphology. Posterior Fossa, vertebral arteries, paraspinal tissues: Unremarkable Disc levels: No significant degenerative change. No neural impingement. IMPRESSION: Noncontrast studies due to presumed extravasation of the injected contrast. Brain MRI: 12 mm lesion in the left corona radiata with restricted diffusion, favor acute infarct over demyelinating plaque. Chronic white matter disease is mild with nonspecific pattern. Cervical MRI: Normal MRI of the cervical cord. Electronically Signed   By: Dorn Roulette M.D.   On: 04/21/2023 05:43   CT Head Wo Contrast Result Date: 04/20/2023 CLINICAL DATA:  Neuro deficit, acute, stroke suspected. Right arm and leg weakness. EXAM: CT HEAD WITHOUT CONTRAST TECHNIQUE: Contiguous axial images were obtained from the base of the skull through the vertex without intravenous contrast. RADIATION DOSE REDUCTION: This exam was performed according to the departmental dose-optimization program which includes automated exposure control, adjustment of the mA and/or kV according to patient size and/or use of iterative reconstruction technique. COMPARISON:  None Available. FINDINGS: Brain: There is no evidence of an acute cortically based infarct, intracranial hemorrhage, mass, midline shift, or extra-axial  fluid collection. Cerebral volume is normal with normal size of the ventricles. Scattered hypodensities in the cerebral white matter bilaterally are mild in severity but abnormal for age. There is a small focus of slightly more discrete hypodensity in the left corona radiata raising the possibility of a recent lacunar infarct. Vascular: No hyperdense vessel. Skull: No acute fracture or suspicious osseous lesion. Sinuses/Orbits: Mild mucosal thickening in the paranasal sinuses. Clear mastoid air cells. Unremarkable orbits. Other: None. IMPRESSION: 1. No evidence of an acute cortically based infarct or intracranial hemorrhage. 2. Mild cerebral white matter  disease, abnormal for age and nonspecific though could reflect chronic small vessel ischemia or demyelinating disease. A recent lacunar infarct is not excluded in the left corona radiata. Consider MRI for further evaluation. Electronically Signed   By: Dasie Hamburg M.D.   On: 04/20/2023 16:39    Microbiology: Recent Results (from the past 240 hours)  Resp panel by RT-PCR (RSV, Flu A&B, Covid) Anterior Nasal Swab     Status: Abnormal   Collection Time: 04/21/23  9:53 AM   Specimen: Anterior Nasal Swab  Result Value Ref Range Status   SARS Coronavirus 2 by RT PCR NEGATIVE NEGATIVE Final   Influenza A by PCR NEGATIVE NEGATIVE Final   Influenza B by PCR NEGATIVE NEGATIVE Final    Comment: (NOTE) The Xpert Xpress SARS-CoV-2/FLU/RSV plus assay is intended as an aid in the diagnosis of influenza from Nasopharyngeal swab specimens and should not be used as a sole basis for treatment. Nasal washings and aspirates are unacceptable for Xpert Xpress SARS-CoV-2/FLU/RSV testing.  Fact Sheet for Patients: bloggercourse.com  Fact Sheet for Healthcare Providers: seriousbroker.it  This test is not yet approved or cleared by the [place]  FDA and has been authorized for detection and/or diagnosis of SARS-CoV-2 by FDA under an Emergency Use Authorization (EUA). This EUA will remain in effect (meaning this test can be used) for the duration of the COVID-19 declaration under Section 564(b)(1) of the Act, 21 U.S.C. section 360bbb-3(b)(1), unless the authorization is terminated or revoked.     Resp Syncytial Virus by PCR POSITIVE (A) NEGATIVE Final    Comment: (NOTE) Fact Sheet for Patients: bloggercourse.com  Fact Sheet for Healthcare Providers: seriousbroker.it  This test is not yet approved or cleared by the [place]  FDA and has been authorized for detection and/or diagnosis of SARS-CoV-2  by FDA under an Emergency Use Authorization (EUA). This EUA will remain in effect (meaning this test can be used) for the duration of the COVID-19 declaration under Section 564(b)(1) of the Act, 21 U.S.C. section 360bbb-3(b)(1), unless the authorization is terminated or revoked.  Performed at [facility], [address], [place], [place] [address]   Urine Culture (for pregnant, neutropenic or urologic patients or patients with an indwelling urinary catheter)     Status: Abnormal   Collection Time: 04/21/23 10:23 AM   Specimen: Urine, Clean Catch  Result Value Ref Range Status   Specimen Description URINE, CLEAN CATCH  Final   Special Requests NONE  Final   Culture (A)  Final    <10,000 COLONIES/mL INSIGNIFICANT GROWTH Performed at [facility], [address] [place], [place] [address]    Report Status 04/22/2023 FINAL  Final     Labs: Basic Metabolic Panel: Recent Labs  Lab 04/20/23 1448 04/21/23 0724 04/22/23 0639 04/23/23 0613 04/23/23 0614  NA 134* 134* 134*  --  137  K 2.7* 3.1* 3.8  --  3.0*  CL 99  99 98  --  102  CO2 27 24 23   --  26  GLUCOSE 125* 114* 159*  --  99  BUN 12 6 19   --  19  CREATININE 1.14* 1.04* 1.46*  --  1.25*  CALCIUM  8.4* 8.2* 9.5  --  8.3*  MG 1.4*  --   --  1.6*  --    Liver Function Tests: Recent Labs  Lab 04/23/23 0614  AST 17  ALT 17  ALKPHOS 70  BILITOT 0.4  PROT 6.4*  ALBUMIN 2.9*   No results for input(s): LIPASE, AMYLASE in the last 168 hours. No results for input(s): AMMONIA in the last 168 hours. CBC: Recent Labs  Lab 04/20/23 1448 04/22/23 0639 04/23/23 0614  WBC 9.3 16.5* 13.0*  HGB 11.6* 12.4 11.6*  HCT 35.7* 37.3 35.4*  MCV 81.5 79.9* 81.6  PLT 222 261 253   Cardiac Enzymes: No results for input(s): CKTOTAL, CKMB, CKMBINDEX, TROPONINI in the last 168 hours. BNP: BNP (last 3 results) No results for input(s): BNP in the last 8760 hours.  ProBNP (last 3 results) No results for  input(s): PROBNP in the last 8760 hours.  CBG: No results for input(s): GLUCAP in the last 168 hours.     Signed:  Toribio Hummer MD.  [facility] 04/23/2023, 3:42 PM

## 2023-04-26 LAB — PROTHROMBIN GENE MUTATION

## 2023-04-27 ENCOUNTER — Encounter: Payer: Self-pay | Admitting: Sports Medicine

## 2023-04-27 ENCOUNTER — Other Ambulatory Visit: Payer: Self-pay | Admitting: Internal Medicine

## 2023-04-27 DIAGNOSIS — J3089 Other allergic rhinitis: Secondary | ICD-10-CM

## 2023-04-27 DIAGNOSIS — L309 Dermatitis, unspecified: Secondary | ICD-10-CM

## 2023-04-30 ENCOUNTER — Other Ambulatory Visit: Payer: Self-pay

## 2023-05-02 LAB — ANA W/REFLEX IF POSITIVE: Anti Nuclear Antibody (ANA): NEGATIVE

## 2023-05-04 ENCOUNTER — Telehealth: Payer: Self-pay

## 2023-05-04 ENCOUNTER — Ambulatory Visit
Admission: RE | Admit: 2023-05-04 | Discharge: 2023-05-04 | Disposition: A | Discharge status: Home / Self Care | Payer: Medicaid Other | Source: Ambulatory Visit | Attending: Sports Medicine | Admitting: Sports Medicine

## 2023-05-04 DIAGNOSIS — M7552 Bursitis of left shoulder: Secondary | ICD-10-CM

## 2023-05-04 DIAGNOSIS — M5030 Other cervical disc degeneration, unspecified cervical region: Secondary | ICD-10-CM

## 2023-05-04 DIAGNOSIS — I639 Cerebral infarction, unspecified: Secondary | ICD-10-CM

## 2023-05-04 NOTE — Progress Notes (Signed)
Received a red flag Emmi stroke notification. I have assigned a Nurse Care Coordinator to call for follow up and determine if there are any Case Management needs.    Iverson Alamin, Donivan Scull [facility] Care Management Assistant [place]/ [facility]  [phone]

## 2023-05-04 NOTE — Patient Outreach (Addendum)
  Emmi Stroke Care Coordination Follow Up  05/04/2023 Name:  Jhoseline Lampron MRN:  [id number] DOB:  1976-01-11  Subjective: Maevry Peddle is a 48 y.o. year old female who is a primary care patient of Joaquin Music, NP An Emmi alert was received indicating patient responded to questions: Sad, hopeless, anxious, or empty?. I reached out by phone to follow up on the alert and spoke to Patient.  Patient admits that she is feeling hopeless, sad and isolated.  Reports that she is using a cane and does not feel comfortable going out in public with her cane.  Reports that she is depressed about the lose of independence. States that now she has no income and is relying on other for transportation.  PHQ9 positive. Offered [facility] counseling and patient agreed.   Also discussed with patient the importance of using her medicaid resources and to review her resources at the healthy blue website.  She agreed.    Patient reports that she has all her medications and is taking her medications as prescribed.  Denies Si or HI  Flowsheet Row Telephone from 05/04/2023 in [place] POP[facility] DEPARTMENT  PHQ-9 Total Score 14          05/04/2023   11:01 AM  Depression screen PHQ 2/9  Decreased Interest 3  Down, Depressed, Hopeless 3  PHQ - 2 Score 6  Altered sleeping 3  Tired, decreased energy 2  Change in appetite 1  Feeling bad or failure about yourself  0  Trouble concentrating 2  Moving slowly or fidgety/restless 0  Suicidal thoughts 0  PHQ-9 Score 14  Difficult doing work/chores Somewhat difficult      Care Coordination Interventions:  Yes, provided  Interventions: Provided support, referral to [facility] social worker for counseling, Encouraged patient to call her medicaid provider to review her benefits. Will route this note to MD. Iverson Alamin ( care management assistant) faxed over RN note.  Encouraged patient to follow up with her MD at [facility] in [facility]  Interventions Today     Flowsheet Row Most Recent Value  Chronic Disease   Chronic disease during today's visit Other  [stroke emmi]  Mental Health Interventions   Mental Health Discussed/Reviewed Mental Health Discussed, Mental Health Reviewed, Depression, Refer to Social Work for counseling  Refer to Social Work for counseling regarding Depression, Anxiety/Coping  Pharmacy Interventions   Pharmacy Dicussed/Reviewed Medications and their functions         Follow up plan: Referral made to social work    Encounter Outcome:  Patient Visit Completed   Lonia Chimera, RN, BSN, [employer] Population Health- Transition of Care Team.  [facility] [phone]

## 2023-05-07 ENCOUNTER — Other Ambulatory Visit: Payer: Self-pay

## 2023-05-08 ENCOUNTER — Other Ambulatory Visit: Payer: Self-pay | Admitting: Licensed Clinical Social Worker

## 2023-05-08 NOTE — Patient Outreach (Signed)
Medicaid Managed Care Social Work Note  05/08/2023 Name:  [name] MRN:  [id number] DOB:  Mar 23, 1976  Colleen Baker is an 48 y.o. year old female who is a primary patient of Joaquin Music, NP.  The Medicaid Managed Care Coordination team was consulted for assistance with:  Mental Health Counseling and Resources  Ms. Westermeyer was given information about Medicaid Managed Care Coordination team services today. [name] Patient agreed to services and verbal consent obtained.  Engaged with patient  for by telephone forinitial visit in response to referral for case management and/or care coordination services.   Patient is participating in a Managed Medicaid Plan:  Yes  Assessments/Interventions:  Review of past medical history, allergies, medications, health status, including review of consultants reports, laboratory and other test data, was performed as part of comprehensive evaluation and provision of chronic care management services.  SDOH: (Social Drivers of Health) assessments and interventions performed: SDOH Interventions    Flowsheet Row Patient Outreach Telephone from 05/08/2023 in [facility]PART[facility] Telephone from 05/04/2023 in [facility] DEPARTMENT  SDOH Interventions    Food Insecurity Interventions Intervention Not Indicated Intervention Not Indicated  Housing Interventions -- Intervention Not Indicated  Transportation Interventions Intervention Not Indicated Intervention Not Indicated  Utilities Interventions -- Intervention Not Indicated  Depression Interventions/Treatment  Medication Medication  [referral to [facility]]  Stress Interventions Community Resources Provided, Provide Counseling --       Advanced Directives Status:  See Care Plan for related entries.  Care Plan                 Allergies  Allergen Reactions   Aciphex [Rabeprazole Sodium] Hives   Lisinopril Swelling and Other (See Comments)    Facial swelling   Iodinated  Contrast Media Hives, Itching and Other (See Comments)    "Contrast dye"    Medications Reviewed Today   Medications were not reviewed in this encounter     Patient Active Problem List   Diagnosis Date Noted   Acute CVA (cerebrovascular accident) (HCC) 04/23/2023   Ischemic stroke (HCC) 04/21/2023   CVA (cerebral vascular accident) (HCC) 04/21/2023   Urinary tract infection 04/21/2023   Vaginal spotting 04/21/2023   Hyponatremia 04/21/2023   Hypokalemia 04/21/2023   Hypocalcemia 04/21/2023   Hypomagnesemia 04/21/2023   Renal insufficiency 04/21/2023   Cough 04/21/2023   Asthma without acute exacerbation 04/21/2023   Crohn's disease (HCC) 04/21/2023   Subclinical hypothyroidism 04/21/2023   Overweight (BMI 25.0-29.9) 04/21/2023   Seasonal allergic conjunctivitis 01/16/2023   Moderate persistent asthma without complication 01/16/2023   Chronic urticaria 10/25/2022   GERD (gastroesophageal reflux disease) 09/17/2019   Postoperative state 11/06/2018   Dermatitis 01/06/2016   Mild persistent asthma 01/06/2016   Seasonal and perennial allergic rhinitis 01/06/2016   Atopic dermatitis 01/06/2016    Conditions to be addressed/monitored per PCP order:  Anxiety and Depression  Care Plan : LCSW Plan of Care  Updates made by Gustavus Bryant, LCSW since 05/08/2023 12:00 AM     Problem: Coping Skills (General Plan of Care)      Goal: Coping Skills Enhanced   Start Date: 05/08/2023  Note:   Timeframe:  Short-Range Goal Priority:  High Start Date:   05/08/23           Expected End Date:  ongoing                     Follow Up Date 05/28/23 at 945 am  -  keep 90 percent of scheduled appointments -consider counseling or psychiatry -consider bumping up your self-care  -consider creating a stronger support network   Why is this important?             Combating depression may take some time.            If you don't feel better right away, don't give up on your treatment plan.     Current barriers:   Chronic Mental Health needs related to symptoms of depression, stress and anxiety. Patient requires Support, Education, Resources, Referrals, Advocacy, and Care Coordination, in order to meet Unmet Mental Health Needs and to find a therapist and psychiatrist. Mobility concerns- uses cane  Mental Health Concerns and Social Isolation Patient lacks knowledge of available community counseling agencies and resources.  Clinical Goal(s): verbalize understanding of plan for management of Anxiety, Depression, and Stress symptoms and demonstrate a reduction in symptoms. Patient will connect with a provider for ongoing mental health treatment, increase coping skills, healthy habits, self-management skills, and stress reduction       Clinical Interventions:  Assessed patient's previous and current treatment, coping skills, support system and barriers to care. Patient provided hx. Referral from [facility] stating-Patient admits that she is feeling hopeless, sad and isolated.  Reports that she is using a cane and does not feel comfortable going out in public with her cane.  Reports that she is depressed about the lose of independence. States that now she has no income and is relying on other for transportation.  PHQ9 positive. Offered [facility] counseling and patient agreed.   Also discussed with patient the importance of using her medicaid resources and to review her resources at the healthy blue website.  She agreed. Patient reports that she has all her medications and is taking her medications as prescribed. Denies Si or HI Verbalization of feelings encouraged, motivational interviewing employed Emotional support provided, positive coping strategies explored. Establishing healthy boundaries emphasized and healthy self-care education provided Patient was educated on available mental health resources within their area that accept Medicaid and offer counseling and psychiatry. Patient was advised to contact  the back of her insurance card for assistance with benefits as well. Patient educated on the difference between therapy and psychiatry per patient request Email sent to patient today with available mental health resources within her area that accept Medicaid and offer the services that she is interested in. Email included instructions for scheduling at [facility] as well as some crisis support resources and [facility]'s walk in clinic hours. Patient will review resources over the next two weeks and make a decision regarding where she wishes to gain MH treatment at. Emotional support provided. CBT intervention implemented regarding "being mentally fit" by combating negative thinking and replacing it with uplifting support, hope and positivity. Patient reports that she is currently taking Lexapro for her depression and anxiety.  Patient reports significant worsening anxiety impacting their ability to function appropriately and carry out daily task. Assessed social determinant of health barriers Discussed use of relaxation techniques and/or diversional activities to assist with pain reduction (distraction, imagery, relaxation, massage, acupressure, TENS, heat, and cold application; Reviewed with patient prescribed pharmacological and nonpharmacological pain relief strategies; Patient will work on implementing appropriate self-care habits into their daily routine such as: staying positive, writing a gratitude list, drinking water, staying active around the house, taking their medications as prescribed, combating negative thoughts or emotions and staying connected with their family and friends. Positive reinforcement provided for this decision to  work on this. LCSW provided education on healthy sleep hygiene and what that looks like. LCSW encouraged patient to implement a night time routine into their schedule that works best for them and that they are able to maintain. Advised patient to implement deep  breathing/grounding/meditation/self-care exercises into their nightly routine to combat racing thoughts at night. LCSW encouraged patient to wake up at the same time each day, make their sleeping environment comfortable, exercise when able, to limit naps and to not eat or drink anything right before bed.  Motivational Interviewing employed Depression screen reviewed  PHQ2/ PHQ9 completed or reviewed  Mindfulness or Relaxation training provided Active listening / Reflection utilized  Advance Care and HCPOA education provided Emotional Support Provided Problem Solving /Task Center strategies reviewed Provided psychoeducation for mental health needs  Provided brief CBT  Reviewed mental health medications and discussed importance of compliance:  Quality of sleep assessed & Sleep Hygiene techniques promoted  Participation in counseling encouraged  Verbalization of feelings encouraged  Suicidal Ideation/Homicidal Ideation assessed: Patient denies SI/HI  Review resources, discussed options and provided patient information about  Mental Health Resources Inter-disciplinary care team collaboration (see longitudinal plan of care)  Patient Goals/Self-Care Activities: Take medications as prescribed   Attend all scheduled provider appointments Call pharmacy for medication refills 3-7 days in advance of running out of medications Perform all self care activities independently  Perform IADL's (shopping, preparing meals, housekeeping, managing finances) independently Call provider office for new concerns or questions Work with the social worker to address care coordination needs and will continue to work with the clinical team to address health care and disease management related needs call 1-800-273-TALK (toll free, 24 hour hotline) If in a crisis, go to [facility] [address], [place] [phone]) call 911 if experiencing a Mental Health or Behavioral  Health Crisis  Utilize healthy coping skills and supportive resources discussed Contact PCP with any questions or concerns Keep 90 percent of counseling appointments Call your insurance provider for more information about your Enhanced Benefits  Check out counseling resources provided  Begin personal counseling with LCSW, to reduce and manage symptoms of Depression and Stress, until well-established with mental health provider Accept all calls from representative with Behavioral Health Providers in an effort to establish ongoing mental health counseling and supportive services. Incorporate into daily practice - relaxation techniques, deep breathing exercises, and mindfulness meditation strategies. Talk about feelings with friends, family members, spiritual advisor, etc. Contact LCSW directly [phone]), if you have questions, need assistance, or if additional social work needs are identified between now and our next scheduled telephone outreach call. Call 988 for mental health hotline/crisis line if needed (24/7 available) Try techniques to reduce symptoms of anxiety/negative thinking (deep breathing, distraction, positive self talk, etc)  - develop a personal safety plan - develop a plan to deal with triggers like holidays, anniversaries - exercise at least 2 to 3 times per week - have a plan for how to handle bad days - journal feelings and what helps to feel better or worse - spend time or talk with others at least 2 to 3 times per week - watch for early signs of feeling worse - begin personal counseling - call and visit an old friend - check out volunteer opportunities - join a support group - laugh; watch a funny movie or comedian - learn and use visualization or guided imagery - perform a random act of kindness - practice relaxation or meditation daily - start  or continue a personal journal - practice positive thinking and self-talk -continue with compliance of taking  medication  -identify current effective and ineffective coping strategies.  -implement positive self-talk in care to increase self-esteem, confidence and feelings of control.  -consider alternative and complementary therapy approaches such as meditation, mindfulness or yoga.  Call your insurance provider to gain education on benefits if desired Call your primary care doctor if symptoms get worse  -journaling, prayer, worship services, meditation or pastoral counseling.  -increase participation in pleasurable group activities such as hobbies, singing, sports or volunteering).  -consider the use of meditative movement therapy such as tai chi, yoga or qigong.  -start a regular daily exercise program based on tolerance, ability and patient choice to support positive thinking and activity    Follow Up Plan:  The patient has been provided with contact information for the care management team and has been advised to call with any mental health or health related questions or concerns.  The care management team will reach out to the patient again over the next 30 business  days.   If you are experiencing a Mental Health or Behavioral Health Crisis or need someone to talk to, please call the Suicide and Crisis Lifeline: 988    Patient Goals: Initial goal      Follow up:  Patient agrees to Care Plan and Follow-up.  Plan: The Managed Medicaid care management team will reach out to the patient again over the next 30 days.  Dickie La, BSW, MSW, LCSW Licensed Clinical Social Worker [employer]   [facility] [email or web address] Direct Dial: [phone]

## 2023-05-08 NOTE — Patient Instructions (Signed)
Visit Information  [name] was given information about Medicaid Managed Care team care coordination services as a part of their Healthy [facility] Medicaid benefit. Colleen Baker verbally consented to engagement with the [facility] Managed Care team.   If you are experiencing a medical emergency, please call 911 or report to your local emergency department or urgent care.   If you have a non-emergency medical problem during routine business hours, please contact your provider's office and ask to speak with a nurse.   For questions related to your Healthy [facility] health plan, please call: [phone] or visit the homepage here: [email or web address]  If you would like to schedule transportation through your Healthy [facility] plan, please call the following number at least 2 days in advance of your appointment: [phone]  For information about your ride after you set it up, call Ride Assist at [phone]. Use this number to activate a Will Call pickup, or if your transportation is late for a scheduled pickup. Use this number, too, if you need to make a change or cancel a previously scheduled reservation.  If you need transportation services right away, call [phone]. The after-hours call center is staffed 24 hours to handle ride assistance and urgent reservation requests (including discharges) 365 days a year. Urgent trips include sick visits, hospital discharge requests and life-sustaining treatment.  Call the [facility] Line at [phone], at any time, 24 hours a day, 7 days a week. If you are in danger or need immediate medical attention call 911.  If you would like help to quit smoking, call 1-800-QUIT-NOW (628-750-0149) OR Espaol: 1-855-Djelo-Ya (4-742-595-6387) o para ms informacin haga clic aqu or Text READY to 564-332 to register via text  Following is a copy of your plan of care:  Care Plan : LCSW Plan of Care  Updates  made by Gustavus Bryant, LCSW since 05/08/2023 12:00 AM     Problem: Coping Skills (General Plan of Care)      Goal: Coping Skills Enhanced   Start Date: 05/08/2023  Note:   Timeframe:  Short-Range Goal Priority:  High Start Date:   05/08/23           Expected End Date:  ongoing                     Follow Up Date 05/28/23 at 945 am  - keep 90 percent of scheduled appointments -consider counseling or psychiatry -consider bumping up your self-care  -consider creating a stronger support network   Why is this important?             Combating depression may take some time.            If you don't feel better right away, don't give up on your treatment plan.    Current barriers:   Chronic Mental Health needs related to symptoms of depression, stress and anxiety. Patient requires Support, Education, Resources, Referrals, Advocacy, and Care Coordination, in order to meet Unmet Mental Health Needs and to find a therapist and psychiatrist. Mobility concerns- uses cane  Mental Health Concerns and Social Isolation Patient lacks knowledge of available community counseling agencies and resources.  Clinical Goal(s): verbalize understanding of plan for management of Anxiety, Depression, and Stress symptoms and demonstrate a reduction in symptoms. Patient will connect with a provider for ongoing mental health treatment, increase coping skills, healthy habits, self-management skills, and stress reduction       Patient Goals/Self-Care Activities: Take medications as prescribed  Attend all scheduled provider appointments Call pharmacy for medication refills 3-7 days in advance of running out of medications Perform all self care activities independently  Perform IADL's (shopping, preparing meals, housekeeping, managing finances) independently Call provider office for new concerns or questions Work with the social worker to address care coordination needs and will continue to work with the clinical  team to address health care and disease management related needs call 1-800-273-TALK (toll free, 24 hour hotline) If in a crisis, go to [facility] [address], [place] [phone]) call 911 if experiencing a Mental Health or Behavioral Health Crisis  Utilize healthy coping skills and supportive resources discussed Contact PCP with any questions or concerns Keep 90 percent of counseling appointments Call your insurance provider for more information about your Enhanced Benefits  Check out counseling resources provided  Begin personal counseling with LCSW, to reduce and manage symptoms of Depression and Stress, until well-established with mental health provider Accept all calls from representative with Behavioral Health Providers in an effort to establish ongoing mental health counseling and supportive services. Incorporate into daily practice - relaxation techniques, deep breathing exercises, and mindfulness meditation strategies. Talk about feelings with friends, family members, spiritual advisor, etc. Contact LCSW directly [phone]), if you have questions, need assistance, or if additional social work needs are identified between now and our next scheduled telephone outreach call. Call 988 for mental health hotline/crisis line if needed (24/7 available) Try techniques to reduce symptoms of anxiety/negative thinking (deep breathing, distraction, positive self talk, etc)  - develop a personal safety plan - develop a plan to deal with triggers like holidays, anniversaries - exercise at least 2 to 3 times per week - have a plan for how to handle bad days - journal feelings and what helps to feel better or worse - spend time or talk with others at least 2 to 3 times per week - watch for early signs of feeling worse - begin personal counseling - call and visit an old friend - check out volunteer opportunities - join a support group - laugh;  watch a funny movie or comedian - learn and use visualization or guided imagery - perform a random act of kindness - practice relaxation or meditation daily - start or continue a personal journal - practice positive thinking and self-talk -continue with compliance of taking medication  -identify current effective and ineffective coping strategies.  -implement positive self-talk in care to increase self-esteem, confidence and feelings of control.  -consider alternative and complementary therapy approaches such as meditation, mindfulness or yoga.  Call your insurance provider to gain education on benefits if desired Call your primary care doctor if symptoms get worse  -journaling, prayer, worship services, meditation or pastoral counseling.  -increase participation in pleasurable group activities such as hobbies, singing, sports or volunteering).  -consider the use of meditative movement therapy such as tai chi, yoga or qigong.  -start a regular daily exercise program based on tolerance, ability and patient choice to support positive thinking and activity    Follow Up Plan:  The patient has been provided with contact information for the care management team and has been advised to call with any mental health or health related questions or concerns.  The care management team will reach out to the patient again over the next 30 business  days.   If you are experiencing a Mental Health or Behavioral Health Crisis or need someone to talk to, please call the Suicide and  Crisis Lifeline: 988    Patient Goals: Initial goal     24- Hour Availability:    [facility]  [address] [place], [place] [phone] Crisis [phone]   [facility] [phone]  [place] [facility]  [phone]    [facility]  [phone] (after hours)   Therapeutic Alternative/Mobile Crisis   [phone]   [place]  National Suicide Hotline  581-591-3074 Len Childs) [place] 884   Call [phone] for mental health emergencies   [facility]  [phone]);  [place] and [employer]  [phone]); [place], [place], [place], [place], [place], [place], [place]    [facility] for [facility] Residents For 24/7 walk-up access to mental health services for [facility] children (4+), adolescents and adults, please visit the [facility] located at [address] in [place], [place].  *[facility] also provides comprehensive outpatient behavioral health services in a variety of locations around the [place].  Connect With [place] [address] [place], [place] [address] HelpLine: 678-149-6009 or 1-346-446-4198  Get Directions  Find Help 24/7 By Phone Call our 24-hour HelpLine at 980-257-4876 or [phone] for immediate assistance for mental health and substance abuse issues.  Walk-In Help [place]: [facility] (Ages 4 and Up) [place]: Emergency Dept., [facility] Additional Resources National Hopeline Network: 1-800-SUICIDE The National Suicide Prevention Lifeline: 1-800-273-TALK     The following coping skill education was provided for stress relief and mental health management: "When your car dies or a deadline looms, how do you respond? Long-term, low-grade or acute stress takes a serious toll on your body and mind, so don't ignore feelings of constant tension. Stress is a natural part of life. However, too much stress can harm our health, especially if it continues every day. This is chronic stress and can put you at risk for heart problems like heart disease and depression. Understand what's happening inside your body and learn simple coping skills to combat the negative impacts of everyday stressors.  Types of Stress There are two types of stress: Emotional -  types of emotional stress are relationship problems, pressure at work, financial worries, experiencing discrimination or having a major life change. Physical - Examples of physical stress include being sick having pain, not sleeping well, recovery from an injury or having an alcohol and drug use disorder. Fight or Flight Sudden or ongoing stress activates your nervous system and floods your bloodstream with adrenaline and cortisol, two hormones that raise blood pressure, increase heart rate and spike blood sugar. These changes pitch your body into a fight or flight response. That enabled our ancestors to outrun saber-toothed tigers, and it's helpful today for situations like dodging a car accident. But most modern chronic stressors, such as finances or a challenging relationship, keep your body in that heightened state, which hurts your health. Effects of Too Much Stress If constantly under stress, most of [place] will eventually start to function less well.  Multiple studies link chronic stress to a higher risk of heart disease, stroke, depression, weight gain, memory loss and even premature death, so it's important to recognize the warning signals. Talk to your doctor about ways to manage stress if you're experiencing any of these symptoms: Prolonged periods of poor sleep. Regular, severe headaches. Unexplained weight loss or gain. Feelings of isolation, withdrawal or worthlessness. Constant anger and irritability. Loss of interest  in activities. Constant worrying or obsessive thinking. Excessive alcohol or drug use. Inability to concentrate.  10 Ways to Cope with Chronic Stress It's key to recognize stressful situations as they occur because it allows you to focus on managing how you react. We all need to know when to close our eyes and take a deep breath when we feel tension rising. Use these tips to prevent or reduce chronic stress. 1. Rebalance Work and Home All work and no play? If you're  spending too much time at the office, intentionally put more dates in your calendar to enjoy time for fun, either alone or with others. 2. Get Regular Exercise Moving your body on a regular basis balances the nervous system and increases blood circulation, helping to flush out stress hormones. Even a daily 20-minute walk makes a difference. Any kind of exercise can lower stress and improve your mood ? just pick activities that you enjoy and make it a regular habit. 3. Eat Well and Limit Alcohol and Stimulants Alcohol, nicotine and caffeine may temporarily relieve stress but have negative health impacts and can make stress worse in the long run. Well-nourished bodies cope better, so start with a good breakfast, add more organic fruits and vegetables for a well-balanced diet, avoid processed foods and sugar, try herbal tea and drink more water. 4. Connect with Supportive People Talking face to face with another person releases hormones that reduce stress. Lean on those good listeners in your life. 5. Carve Out Hobby Time Do you enjoy gardening, reading, listening to music or some other creative pursuit? Engage in activities that bring you pleasure and joy; research shows that reduces stress by almost half and lowers your heart rate, too. 6. Practice Meditation, Stress Reduction or Yoga Relaxation techniques activate a state of restfulness that counterbalances your body's fight-or-flight hormones. Even if this also means a 10-minute break in a long day: listen to music, read, go for a walk in nature, do a hobby, take a bath or spend time with a friend. Also consider doing a mindfulness exercise or try a daily deep breathing or imagery practice. Deep Breathing Slow, calm and deep breathing can help you relax. Try these steps to focus on your breathing and repeat as needed. Find a comfortable position and close your eyes. Exhale and drop your shoulders. Breathe in through your nose; fill your lungs and  then your belly. Think of relaxing your body, quieting your mind and becoming calm and peaceful. Breathe out slowly through your nose, relaxing your belly. Think of releasing tension, pain, worries or distress. Repeat steps three and four until you feel relaxed. Imagery This involves using your mind to excite the senses -- sound, vision, smell, taste and feeling. This may help ease your stress. Begin by getting comfortable and then do some slow breathing. Imagine a place you love being at. It could be somewhere from your childhood, somewhere you vacationed or just a place in your imagination. Feel how it is to be in the place you're imagining. Pay attention to the sounds, air, colors, and who is there with you. This is a place where you feel cared for and loved. All is well. You are safe. Take in all the smells, sounds, tastes and feelings. As you do, feel your body being nourished and healed. Feel the calm that surrounds you. Breathe in all the good. Breathe out any discomfort or tension. 7. Sleep Enough If you get less than seven to eight hours of sleep, your  body won't tolerate stress as well as it could. If stress keeps you up at night, address the cause, and add extra meditation into your day to make up for the lost z's. Try to get seven to nine hours of sleep each night. Make a regular bedtime schedule. Keep your room dark and cool. Try to avoid computers, TV, cell phones and tablets before bed. 8. Bond with Connections You Enjoy Go out for a coffee with a friend, chat with a neighbor, call a family member, visit with a clergy member, or even hang out with your pet. Clinical studies show that spending even a short time with a companion animal can cut anxiety levels almost in half. 9. Take a Vacation Getting away from it all can reset your stress tolerance by increasing your mental and emotional outlook, which makes you a happier, more productive person upon return. Leave your cellphone and  laptop at home! 10. See a Counselor, Coach or Therapist If negative thoughts overwhelm your ability to make positive changes, it's time to seek professional help. Make an appointment today--your health and life are worth it."  Dickie La, BSW, MSW, LCSW Licensed Clinical Social Worker [employer]   [email or web address] Direct Dial: [phone]

## 2023-05-24 ENCOUNTER — Other Ambulatory Visit: Payer: Self-pay

## 2023-05-24 NOTE — Progress Notes (Signed)
 Specialty Pharmacy Ongoing Clinical Assessment Note  Colleen Baker is a 48 y.o. female who is being followed by the specialty pharmacy service for RxSp Atopic Dermatitis   Patient's specialty medication(s) reviewed today: Dupilumab  (Dupixent )   Missed doses in the last 4 weeks: 0   Patient/Caregiver did not have any additional questions or concerns.   Therapeutic benefit summary: Patient is achieving benefit   Adverse events/side effects summary: No adverse events/side effects   Patient's therapy is appropriate to: Continue    Goals Addressed             This Visit's Progress    Minimize recurrence of flares       Patient is on track. Patient will maintain adherence         Follow up:  6 months  Mitzie GORMAN [facility] Specialty Pharmacist

## 2023-05-24 NOTE — Progress Notes (Deleted)
 Specialty Pharmacy Refill Coordination Note  Colleen Baker is a 48 y.o. female contacted today regarding refills of specialty medication(s) Dupilumab  (Dupixent )   Patient requested Delivery   Delivery date: 05/30/23   Verified address: [address]   [place] [address]   Medication will be filled on 05/29/23.

## 2023-05-24 NOTE — Progress Notes (Signed)
 Specialty Pharmacy Refill Coordination Note  Colleen Baker is a 48 y.o. female contacted today regarding refills of specialty medication(s) Dupilumab  (Dupixent )   Patient requested Delivery   Delivery date: 05/30/23   Verified address: [address]   [place] [address]   Medication will be filled on 05/29/23.

## 2023-05-28 ENCOUNTER — Other Ambulatory Visit: Payer: Self-pay | Admitting: Licensed Clinical Social Worker

## 2023-05-28 NOTE — Patient Instructions (Signed)
 Colleen Baker ,   The [facility] Managed Care Team is available to provide assistance to you with your healthcare needs at no cost and as a benefit of your [facility] Health plan. I'm sorry I was unable to reach you today for our scheduled appointment. Our care guide will call you to reschedule our telephone appointment. Please call me at the number below. I am available to be of assistance to you regarding your healthcare needs. .   Thank you,   Kolleen Perone, BSW, MSW, LCSW Licensed Clinical Social Worker [employer]   [facility] [email or web address] Direct Dial: [phone]

## 2023-05-28 NOTE — Patient Outreach (Signed)
  Medicaid Managed Care   Unsuccessful Attempt Note   05/28/2023 Name: Colleen Baker MRN: [id number] DOB: 10-23-1975  Referred by: Delta Fila, NP Reason for referral : No chief complaint on file.   An unsuccessful telephone outreach was attempted today. The patient was referred to the case management team for assistance with care management and care coordination.    Follow Up Plan: A HIPAA compliant phone message was left for the patient providing contact information and requesting a return call.   Kolleen Perone, BSW, MSW, LCSW Licensed Clinical Social Worker [employer]   [facility] [email or web address] Direct Dial: [phone]

## 2023-05-28 NOTE — Progress Notes (Deleted)
 [facility] [address] [place].  [address] [phone]       HOSPITAL FOLLOW UP NOTE  [name] Date of Birth:  [birth year] Medical Record Number:  [id number]   Reason for Referral:  hospital stroke follow up    SUBJECTIVE:   CHIEF COMPLAINT:  No chief complaint on file.   HPI:   [name] is a 48 y.o. female with history of HTN, LBP, Crohn's disease, fibroid s/p hysterectomy, occasional cigar smoker who presented to ED on 04/20/2023 with difficulty walking and RLE weakness.  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  Further workup as noted below.  Recommended DAPT for 3 weeks and aspirin alone.  LDL 130, initiated atorvastatin 40 mg daily.  Resume home HTN medications except for hydrochlorothiazide due to multiple electrolyte abnormalities and recommended close PCP follow-up.  Smoking cessation counseling provided.  Hypercoagulable labs unremarkable.  Therapies recommended outpatient for residual mild RLE weakness and decreased sensation.        PERTINENT IMAGING  CT no acute finding CTA head and neck Some irregularity of medium size intracranial branches on thick MIPS could be a real finding or technical artifact. I favor technical artifact but she does have several uncontrolled risk factor for premature atherosclerosis MRI  L CR small infarct MRI C-spine - unremarkable 2D Echo EF > 75% LDL 130 HgbA1c 6.1 UDS benzo Hypercoagulable labs unremarkable    ROS:   14 system review of systems performed and negative with exception of ***  PMH:  Past Medical History:  Diagnosis Date   Anemia    allergy induced   Anxiety    Arthritis    Asthma with allergic rhinitis    Chronic back pain    Crohn's disease in remission (HCC)    Depression    Eczema    GERD (gastroesophageal reflux disease)    Headache    Heart murmur    asymptomatic    History of abnormal cervical Pap smear    History  of kidney stones    Hypertension    Irritable bowel    Pinched nerve in neck    Uterine fibroid     PSH:  Past Surgical History:  Procedure Laterality Date   ABDOMINAL HYSTERECTOMY     BUNIONECTOMY Left    CESAREAN SECTION  08/31/2011   Procedure: CESAREAN SECTION;  Surgeon: Freddrick March. Tenny Craw, MD;  Location: [facility];  Service: Gynecology;  Laterality: N/A;   COLONOSCOPY  last one 2015   CYSTOSCOPY  11/06/2018   Procedure: CYSTOSCOPY;  Surgeon: Carrington Clamp, MD;  Location: [facility];  Service: Gynecology;;   DILATATION & CURETTAGE/HYSTEROSCOPY WITH MYOSURE N/A 01/13/2015   Procedure: DILATATION & CURETTAGE/HYSTEROSCOPY WITH  MYOSURE;  Surgeon: Waynard Reeds, MD;  Location: [facility];  Service: Gynecology;  Laterality: N/A;   ganglion cyst removed Right    hiatal hernia repair  as child   LEEP  1990's   MENISCUS REPAIR Right 6 yrs ago   MYOMECTOMY ABDOMINAL APPROACH  02/15/2005   NORPLANT REMOVAL Left 11/06/2018   Procedure: REMOVAL OF NEXPLANON;  Surgeon: Carrington Clamp, MD;  Location: [facility];  Service: Gynecology;  Laterality: Left;   ROBOT ASSISTED MYOMECTOMY  06/2010   ROBOTIC ASSISTED LAPAROSCOPIC HYSTERECTOMY AND SALPINGECTOMY Bilateral 11/06/2018   Procedure: XI ROBOTIC ASSISTED LAPAROSCOPIC HYSTERECTOMY WITH BILATERAL SALPINGECTOMY;  Surgeon: Carrington Clamp, MD;  Location: [facility] [place];  Service: Gynecology;  Laterality:  Bilateral;   SHOULDER SURGERY      Social History:  Social History   Socioeconomic History   Marital status: Single    Spouse name: Not on file   Number of children: Not on file   Years of education: Not on file   Highest education level: Not on file  Occupational History   Not on file  Tobacco Use   Smoking status: Never    Passive exposure: Yes   Smokeless tobacco: Never  Vaping Use   Vaping status: Never Used  Substance and Sexual Activity   Alcohol use: Yes     Comment: occasional   Drug use: No   Sexual activity: Yes    Birth control/protection: Pill, Implant    Comment: to be removed during surgery  Other Topics Concern   Not on file  Social History Narrative   Not on file   Social Drivers of Health   Financial Resource Strain: Not on file  Food Insecurity: No Food Insecurity (05/08/2023)   Hunger Vital Sign    Worried About Running Out of Food in the Last Year: Never true    Ran Out of Food in the Last Year: Never true  Transportation Needs: No Transportation Needs (05/11/2023)   Received from [employer]    In the past 12 months, has lack of reliable transportation kept you from medical appointments, meetings, work or from getting things needed for daily living? : No  Physical Activity: Not on file  Stress: Stress Concern Present (05/08/2023)   [employer] of Occupational Health - Occupational Stress Questionnaire    Feeling of Stress : Rather much  Social Connections: Unknown (08/17/2021)   [facility]   Social Network    Social Network: Not on file  Intimate Partner Violence: Not At Risk (05/04/2023)   Humiliation, Afraid, Rape, and Kick questionnaire    Fear of Current or Ex-Partner: No    Emotionally Abused: No    Physically Abused: No    Sexually Abused: No    Family History:  Family History  Problem Relation Age of Onset   Hypertension Mother    Asthma Mother    Bronchitis Mother    Heart disease Mother    Kidney failure Father        on HD temporary   Kidney disease Father    Asthma Father    Parkinson's disease Father    Eczema Sister    Sinusitis Sister    Asthma Maternal Grandmother    Eczema Maternal Grandmother    Bronchitis Maternal Grandmother    Allergic rhinitis Son    Eczema Son    Urticaria Neg Hx    Immunodeficiency Neg Hx    Angioedema Neg Hx     Medications:   Current Outpatient Medications on File Prior to Visit  Medication Sig Dispense  Refill   levocetirizine (XYZAL) 5 MG tablet TAKE UP TO 2 TABLETS TWICE DAILY AS NEEDED FOR HIVES AND SWELLING 120 tablet 5   albuterol (VENTOLIN HFA) 108 (90 Base) MCG/ACT inhaler INHALE 2 PUFFS INTO THE LUNGS EVERY 4 HOURS AS NEEDED FOR WHEEING OR SHORTNESS OF BREATH (Patient taking differently: Inhale 2 puffs into the lungs every 4 (four) hours as needed for wheezing or shortness of breath.) 6.7 g 1   amLODipine (NORVASC) 10 MG tablet Take 10 mg by mouth daily.     atorvastatin (LIPITOR) 40 MG tablet Take 1 tablet (40 mg total) by mouth  daily. 30 tablet 0   azelastine (ASTELIN) 0.1 % nasal spray Place 1-2 sprays into both nostrils 2 (two) times daily as needed. (Patient taking differently: Place 1-2 sprays into both nostrils 2 (two) times daily as needed for allergies or rhinitis.) 30 mL 5   Blood Pressure Monitoring (H-E-B INCONTROL BP MONITOR) KIT 1 Device by Does not apply route 3 (three) times daily. 1 kit 0   budesonide-formoterol (SYMBICORT) 160-4.5 MCG/ACT inhaler Take 2 puffs 3-7 days a week for control of asthma symptoms (Patient not taking: Reported on 04/22/2023) 1 each 5   cloNIDine (CATAPRES) 0.1 MG tablet Take 0.1 mg by mouth daily as needed (if B/P is greater than 160/100).     dupilumab (DUPIXENT) 300 MG/2ML prefilled syringe Inject 600 mg into the skin once for 1 dose. Then 300mg  every 14 days (Patient taking differently: Inject 300 mg into the skin every 14 (fourteen) days.) 4 mL 11   escitalopram (LEXAPRO) 10 MG tablet Take 10 mg by mouth daily.     famotidine (PEPCID) 20 MG tablet Take 1 tablet (20 mg total) by mouth daily. (Patient taking differently: Take 20 mg by mouth in the morning.) 30 tablet 3   Ferrous Sulfate (IRON HIGH-POTENCY) 325 MG TABS Take 325 mg by mouth daily with breakfast.     fluticasone (FLONASE) 50 MCG/ACT nasal spray Place 2 sprays into both nostrils daily as needed for allergies or rhinitis. 16 g 4   guaiFENesin (MUCINEX) 600 MG 12 hr tablet Take 1 tablet  (600 mg total) by mouth 2 (two) times daily. 60 tablet 0   hydrocortisone 2.5 % ointment Apply topically twice daily as need to red sandpapery rash. (Patient taking differently: Apply 1 Application topically 2 (two) times daily as needed (to "red, sandpaper-y rash").) 30 g 0   losartan (COZAAR) 50 MG tablet Take 1 tablet (50 mg total) by mouth daily. 90 tablet 0   mesalamine (LIALDA) 1.2 g EC tablet Take 2.4 g by mouth daily with breakfast.     metoprolol succinate (TOPROL-XL) 100 MG 24 hr tablet Take 100 mg by mouth daily. Take with or immediately following a meal.     montelukast (SINGULAIR) 10 MG tablet Take 1 tablet (10 mg total) by mouth at bedtime. 90 tablet 1   pantoprazole (PROTONIX) 40 MG tablet Take 40 mg by mouth 2 (two) times daily before a meal.     potassium chloride SA (KLOR-CON M) 20 MEQ tablet Take 1 tablet (20 mEq total) by mouth daily for 7 days.     tacrolimus (PROTOPIC) 0.1 % ointment Use topically twice daily as needed for red, sandpapery rash (Patient taking differently: Apply 1 Application topically See admin instructions. Use topically twice daily as needed for "red, sandpaper-y rash") 100 g 3   triamcinolone cream (KENALOG) 0.1 % Apply 1 Application topically 2 (two) times daily as needed (itching). Do not use on face, groin or arm pits. 453 g 3   Vitamin D, Ergocalciferol, (DRISDOL) 1.25 MG (50000 UNIT) CAPS capsule Take 50,000 Units by mouth every 7 (seven) days.     No current facility-administered medications on file prior to visit.    Allergies:   Allergies  Allergen Reactions   Aciphex [Rabeprazole Sodium] Hives   Lisinopril Swelling and Other (See Comments)    Facial swelling   Iodinated Contrast Media Hives, Itching and Other (See Comments)    "Contrast dye"      OBJECTIVE:  Physical Exam  There were no vitals filed  for this visit. There is no height or weight on file to calculate BMI. No results found.   General: well developed, well nourished,  seated, in no evident distress Head: head normocephalic and atraumatic.   Neck: supple with no carotid or supraclavicular bruits Cardiovascular: regular rate and rhythm, no murmurs Musculoskeletal: no deformity Skin:  no rash/petichiae Vascular:  Normal pulses all extremities   Neurologic Exam Mental Status: Awake and fully alert. Oriented to place and time. Recent and remote memory intact. Attention span, concentration and fund of knowledge appropriate. Mood and affect appropriate.  Cranial Nerves: Fundoscopic exam reveals sharp disc margins. Pupils equal, briskly reactive to light. Extraocular movements full without nystagmus. Visual fields full to confrontation. Hearing intact. Facial sensation intact. Face, tongue, palate moves normally and symmetrically.  Motor: Normal bulk and tone. Normal strength in all tested extremity muscles Sensory.: intact to touch , pinprick , position and vibratory sensation.  Coordination: Rapid alternating movements normal in all extremities. Finger-to-nose and heel-to-shin performed accurately bilaterally. Gait and Station: Arises from chair without difficulty. Stance is normal. Gait demonstrates normal stride length and balance with ***. Tandem walk and heel toe ***.  Reflexes: 1+ and symmetric. Toes downgoing.     NIHSS  *** Modified Rankin  ***      ASSESSMENT: Colleen Baker is a 48 y.o. year old female with left CR small infarct on 04/20/2023 secondary to small vessel disease source. Vascular risk factors include HTN, HLD, pre-DM and tobacco use.      PLAN:  Ischemic stroke:  Residual deficit: ***.  Continue aspirin 81mg  daily and atorvastatin (Lipitor) for secondary stroke prevention managed/prescribed by PCP.   Discussed secondary stroke prevention measures and importance of close PCP follow up for aggressive stroke risk factor management including BP goal<130/90, HLD with LDL goal<70 and pre-DM with A1c.<7 .  Stroke labs 04/2023: LDL 130, A1c  6.1 I have gone over the pathophysiology of stroke, warning signs and symptoms, risk factors and their management in some detail with instructions to go to the closest emergency room for symptoms of concern.     Follow up in *** or call earlier if needed   CC:  [facility] provider: Dr. Pearlean Brownie PCP: Joaquin Music, NP    I spent *** minutes of face-to-face and non-face-to-face time with patient.  This included previsit chart review including review of recent hospitalization, lab review, study review, order entry, electronic health record documentation, patient education regarding recent stroke including etiology, secondary stroke prevention measures and importance of managing stroke risk factors, residual deficits and typical recovery time and answered all other questions to patient satisfaction   Ihor Austin, AGNP-BC  [facility] [address] [place], [place] [address]  Phone [phone] Fax [phone] Note: This document was prepared with digital dictation and possible smart phrase technology. Any transcriptional errors that result from this process are unintentional.

## 2023-05-29 ENCOUNTER — Other Ambulatory Visit: Payer: Self-pay

## 2023-05-29 ENCOUNTER — Telehealth: Payer: Self-pay | Admitting: Adult Health

## 2023-05-29 ENCOUNTER — Inpatient Hospital Stay: Payer: Medicaid Other | Admitting: Adult Health

## 2023-05-29 NOTE — Telephone Encounter (Signed)
Pt no show due to transportation issues. Rescheduled appointment

## 2023-06-11 ENCOUNTER — Other Ambulatory Visit: Payer: Self-pay

## 2023-06-13 ENCOUNTER — Other Ambulatory Visit: Payer: Self-pay

## 2023-06-14 ENCOUNTER — Other Ambulatory Visit: Payer: Self-pay | Admitting: Pharmacy Technician

## 2023-06-14 ENCOUNTER — Other Ambulatory Visit: Payer: Self-pay

## 2023-06-14 NOTE — Progress Notes (Signed)
 Specialty Pharmacy Refill Coordination Note  Colleen Baker is a 48 y.o. female contacted today regarding refills of specialty medication(s) Dupilumab (Dupixent)   Patient requested Delivery   Delivery date: 07/03/23   Verified address: [address] [place]   Medication will be filled on 07/02/23.

## 2023-06-18 ENCOUNTER — Other Ambulatory Visit: Payer: Self-pay | Admitting: Licensed Clinical Social Worker

## 2023-06-18 NOTE — Patient Outreach (Addendum)
 Medicaid Managed Care Social Work Note  06/18/2023 Name:  Kawehi Hostetter MRN:  [id number] DOB:  Nov 04, 1975  Colleen Baker is an 48 y.o. year old female who is a primary patient of Joaquin Music, NP.  The Medicaid Managed Care Coordination team was consulted for assistance with:  Mental Health Counseling and Resources  Ms. Ruud was given information about Medicaid Managed Care Coordination team services today. [name] Patient agreed to services and verbal consent obtained.  Engaged with patient  for by telephone forfollow up visit in response to referral for case management and/or care coordination services.   Patient is participating in a Managed Medicaid Plan:  Yes  Assessments/Interventions:  Review of past medical history, allergies, medications, health status, including review of consultants reports, laboratory and other test data, was performed as part of comprehensive evaluation and provision of chronic care management services.  SDOH: (Social Drivers of Health) assessments and interventions performed: SDOH Interventions    Flowsheet Row Patient Outreach Telephone from 06/18/2023 in [place] POPULATION HEALTH DEPARTMENT Patient Outreach Telephone from 05/08/2023 in [place] POPULATION HEALTH DEPARTMENT Telephone from 05/04/2023 in [place] POPULATION HEALTH DEPARTMENT  SDOH Interventions     Food Insecurity Interventions -- Intervention Not Indicated Intervention Not Indicated  Housing Interventions -- -- Intervention Not Indicated  Transportation Interventions -- Intervention Not Indicated Intervention Not Indicated  Utilities Interventions Intervention Not Indicated -- Intervention Not Indicated  Depression Interventions/Treatment  -- Medication Medication  [referral to [facility]]  Financial Strain Interventions Community Resources Provided  [employer] sent with resources] -- --  Stress Interventions -- [facility] Provided, Provide Counseling --       Advanced Directives  Status:  See Care Plan for related entries.  Care Plan                 Allergies  Allergen Reactions   Aciphex [Rabeprazole Sodium] Hives   Lisinopril Swelling and Other (See Comments)    Facial swelling   Iodinated Contrast Media Hives, Itching and Other (See Comments)    "Contrast dye"    Medications Reviewed Today     Reviewed by Gustavus Bryant, LCSW (Social Worker) on 06/18/23 at 1232  Med List Status: <None>   Medication Order Taking? Sig Documenting Provider Last Dose Status Informant  albuterol (VENTOLIN HFA) 108 (90 Base) MCG/ACT inhaler [id number] No INHALE 2 PUFFS INTO THE LUNGS EVERY 4 HOURS AS NEEDED FOR WHEEING OR SHORTNESS OF BREATH  Patient taking differently: Inhale 2 puffs into the lungs every 4 (four) hours as needed for wheezing or shortness of breath.   Verlee Monte, MD Taking Active Self  amLODipine (NORVASC) 10 MG tablet [id number] No Take 10 mg by mouth daily. [provider] 04/20/2023 Active Self  atorvastatin (LIPITOR) 40 MG tablet [id number]  Take 1 tablet (40 mg total) by mouth daily. Jerald Kief, MD  Expired 05/23/23 2359   azelastine (ASTELIN) 0.1 % nasal spray [id number] No Place 1-2 sprays into both nostrils 2 (two) times daily as needed.  Patient taking differently: Place 1-2 sprays into both nostrils 2 (two) times daily as needed for allergies or rhinitis.   Verlee Monte, MD Taking Active Self  Blood Pressure Monitoring (H-E-B INCONTROL BP MONITOR) KIT [id number]  1 Device by Does not apply route 3 (three) times daily. Jerald Kief, MD  Active   budesonide-formoterol [facility]) 160-4.5 MCG/ACT inhaler [id number] No Take 2 puffs 3-7 days a week for control of asthma symptoms  Patient not taking:  Reported on 04/22/2023   Hetty Blend, FNP Not Taking Active Self  cloNIDine (CATAPRES) 0.1 MG tablet [id number]  Take 0.1 mg by mouth daily as needed (if B/P is greater than 160/100). [provider]  Active Self  dupilumab (DUPIXENT) 300  MG/2ML prefilled syringe [id number] No Inject 600 mg into the skin once for 1 dose. Then 300mg  every 14 days  Patient taking differently: Inject 300 mg into the skin every 14 (fourteen) days.   Verlee Monte, MD 04/10/2023 Active Self  escitalopram (LEXAPRO) 10 MG tablet [id number] No Take 10 mg by mouth daily. [provider] 04/20/2023 Active Self  famotidine (PEPCID) 20 MG tablet [id number] No Take 1 tablet (20 mg total) by mouth daily.  Patient taking differently: Take 20 mg by mouth in the morning.   Nehemiah Settle, FNP 04/20/2023 Active Self  Ferrous Sulfate (IRON HIGH-POTENCY) 325 MG TABS [id number] No Take 325 mg by mouth daily with breakfast. [provider] 04/20/2023 Active Self  fluticasone (FLONASE) 50 MCG/ACT nasal spray [id number] No Place 2 sprays into both nostrils daily as needed for allergies or rhinitis. Hetty Blend, FNP Taking Active Self  guaiFENesin (MUCINEX) 600 MG 12 hr tablet [id number]  Take 1 tablet (600 mg total) by mouth 2 (two) times daily. Jerald Kief, MD  Active   hydrocortisone 2.5 % ointment [id number] No Apply topically twice daily as need to red sandpapery rash.  Patient taking differently: Apply 1 Application topically 2 (two) times daily as needed (to "red, sandpaper-y rash").   Verlee Monte, MD Taking Active Self  levocetirizine (XYZAL) 5 MG tablet [id number]  TAKE UP TO 2 TABLETS TWICE DAILY AS NEEDED FOR HIVES AND SWELLING Verlee Monte, MD  Active   losartan (COZAAR) 50 MG tablet [id number]  Take 1 tablet (50 mg total) by mouth daily. Rodolph Bong, MD  Active   mesalamine Theodosia Blender) 1.2 g EC tablet [id number] No Take 2.4 g by mouth daily with breakfast. [provider] 04/20/2023 Active Self  metoprolol succinate (TOPROL-XL) 100 MG 24 hr tablet [id number] No Take 100 mg by mouth daily. Take with or immediately following a meal. [provider] 04/20/2023 Active Self           Med Note Antony Madura, Dyane Dustman Apr 22, 2023 12:26 PM) I  told the patient this looks to have not been filled since almost a year ago and she said she had a surplus of tablets. She stated X2 she DOES take this once a day.  montelukast (SINGULAIR) 10 MG tablet [id number] No Take 1 tablet (10 mg total) by mouth at bedtime. Hetty Blend, FNP 04/19/2023 Active Self  pantoprazole (PROTONIX) 40 MG tablet [id number] No Take 40 mg by mouth 2 (two) times daily before a meal. [provider] Past Week Active Self  potassium chloride SA (KLOR-CON M) 20 MEQ tablet [id number]  Take 1 tablet (20 mEq total) by mouth daily for 7 days. Rodolph Bong, MD  Expired 04/30/23 2359   tacrolimus (PROTOPIC) 0.1 % ointment [id number] No Use topically twice daily as needed for red, sandpapery rash  Patient taking differently: Apply 1 Application topically See admin instructions. Use topically twice daily as needed for "red, sandpaper-y rash"   Verlee Monte, MD Taking Active Self  triamcinolone cream (KENALOG) 0.1 % [id number] No Apply 1 Application topically 2 (two) times daily as needed (itching). Do not use on face, groin or arm pits. Verlee Monte, MD  Taking Active Self  Vitamin D, Ergocalciferol, (DRISDOL) 1.25 MG (50000 UNIT) CAPS capsule [id number] No Take 50,000 Units by mouth every 7 (seven) days. [provider] Past Week Active Self           Med Note Antony Madura, Dyane Dustman Apr 22, 2023 12:21 PM) Takes on no specific day of the week            Patient Active Problem List   Diagnosis Date Noted   Acute CVA (cerebrovascular accident) (HCC) 04/23/2023   Ischemic stroke (HCC) 04/21/2023   CVA (cerebral vascular accident) (HCC) 04/21/2023   Urinary tract infection 04/21/2023   Vaginal spotting 04/21/2023   Hyponatremia 04/21/2023   Hypokalemia 04/21/2023   Hypocalcemia 04/21/2023   Hypomagnesemia 04/21/2023   Renal insufficiency 04/21/2023   Cough 04/21/2023   Asthma without acute exacerbation 04/21/2023   Crohn's disease (HCC) 04/21/2023    Subclinical hypothyroidism 04/21/2023   Overweight (BMI 25.0-29.9) 04/21/2023   Seasonal allergic conjunctivitis 01/16/2023   Moderate persistent asthma without complication 01/16/2023   Chronic urticaria 10/25/2022   GERD (gastroesophageal reflux disease) 09/17/2019   Postoperative state 11/06/2018   Dermatitis 01/06/2016   Mild persistent asthma 01/06/2016   Seasonal and perennial allergic rhinitis 01/06/2016   Atopic dermatitis 01/06/2016    Conditions to be addressed/monitored per PCP order:   Stress Management  Care Plan : LCSW Plan of Care  Updates made by Gustavus Bryant, LCSW since 06/18/2023 12:00 AM     Problem: Coping Skills (General Plan of Care)      Goal: Coping Skills Enhanced   Start Date: 05/08/2023  Note:   Timeframe:  Short-Range Goal Priority:  High Start Date:   05/08/23           Expected End Date:  ongoing                     Follow Up Date -to be determined, this [facility] LCSW will be out of short term leave starting 06/22/23 and patient was made aware  - keep 90 percent of scheduled appointments -consider counseling or psychiatry -consider bumping up your self-care  -consider creating a stronger support network   Why is this important?             Combating depression may take some time.            If you don't feel better right away, don't give up on your treatment plan.    Current barriers:   Chronic Mental Health needs related to symptoms of depression, stress and anxiety. Patient requires Support, Education, Resources, Referrals, Advocacy, and Care Coordination, in order to meet Unmet Mental Health Needs and to find a therapist and psychiatrist. Mobility concerns- uses cane  Mental Health Concerns and Social Isolation Patient lacks knowledge of available community counseling agencies and resources.  Clinical Goal(s): verbalize understanding of plan for management of Anxiety, Depression, and Stress symptoms and demonstrate a reduction in symptoms.  Patient will connect with a provider for ongoing mental health treatment, increase coping skills, healthy habits, self-management skills, and stress reduction       Clinical Interventions:  Assessed patient's previous and current treatment, coping skills, support system and barriers to care. Patient provided hx. Referral from [facility] stating-Patient admits that she is feeling hopeless, sad and isolated.  Reports that she is using a cane and does not feel comfortable going out in public with her cane.  Reports that she is depressed about  the lose of independence. States that now she has no income and is relying on other for transportation.  PHQ9 positive. Offered [facility] counseling and patient agreed.   Also discussed with patient the importance of using her medicaid resources and to review her resources at the healthy blue website.  She agreed. Patient reports that she has all her medications and is taking her medications as prescribed. Denies Si or HI Verbalization of feelings encouraged, motivational interviewing employed Emotional support provided, positive coping strategies explored. Establishing healthy boundaries emphasized and healthy self-care education provided Patient was educated on available mental health resources within their area that accept Medicaid and offer counseling and psychiatry. Patient was advised to contact the back of her insurance card for assistance with benefits as well. Patient educated on the difference between therapy and psychiatry per patient request Email sent to patient today with available mental health resources within her area that accept Medicaid and offer the services that she is interested in. Email included instructions for scheduling at [facility] as well as some crisis support resources and [facility]'s walk in clinic hours. Patient will review resources over the next two weeks and make a decision regarding where she wishes to gain MH treatment at. Emotional support provided.  CBT intervention implemented regarding "being mentally fit" by combating negative thinking and replacing it with uplifting support, hope and positivity. Patient reports that she is currently taking Lexapro for her depression and anxiety.  Patient reports significant worsening anxiety impacting their ability to function appropriately and carry out daily task. Assessed social determinant of health barriers Discussed use of relaxation techniques and/or diversional activities to assist with pain reduction (distraction, imagery, relaxation, massage, acupressure, TENS, heat, and cold application; Reviewed with patient prescribed pharmacological and nonpharmacological pain relief strategies; Patient will work on implementing appropriate self-care habits into their daily routine such as: staying positive, writing a gratitude list, drinking water, staying active around the house, taking their medications as prescribed, combating negative thoughts or emotions and staying connected with their family and friends. Positive reinforcement provided for this decision to work on this. LCSW provided education on healthy sleep hygiene and what that looks like. LCSW encouraged patient to implement a night time routine into their schedule that works best for them and that they are able to maintain. Advised patient to implement deep breathing/grounding/meditation/self-care exercises into their nightly routine to combat racing thoughts at night. LCSW encouraged patient to wake up at the same time each day, make their sleeping environment comfortable, exercise when able, to limit naps and to not eat or drink anything right before bed.  Motivational Interviewing employed Depression screen reviewed  PHQ2/ PHQ9 completed or reviewed  Mindfulness or Relaxation training provided Active listening / Reflection utilized  Advance Care and HCPOA education provided Emotional Support Provided Problem Solving /Task Center strategies  reviewed Provided psychoeducation for mental health needs  Provided brief CBT  Reviewed mental health medications and discussed importance of compliance:  Quality of sleep assessed & Sleep Hygiene techniques promoted  Participation in counseling encouraged  Verbalization of feelings encouraged  Suicidal Ideation/Homicidal Ideation assessed: Patient denies SI/HI  Review resources, discussed options and provided patient information about  Mental Health Resources Inter-disciplinary care team collaboration (see longitudinal plan of care) 06/18/23 - Patient is requesting additional time to review [facility] resources as she forgot to review the email that was sent by [facility] LCSW last month. [facility] LCSW resent this email and included financial and dental resources for her to also review. Patient denies  any current crises and was educated on these emergent resources in case they are needed. Patient was made aware that [facility] LCSW will be going on short term leave this week and that the next LCSW follow up appointment with [facility] team will be with a new provider.   Patient Goals/Self-Care Activities: Take medications as prescribed   Attend all scheduled provider appointments Call pharmacy for medication refills 3-7 days in advance of running out of medications Perform all self care activities independently  Perform IADL's (shopping, preparing meals, housekeeping, managing finances) independently Call provider office for new concerns or questions Work with the social worker to address care coordination needs and will continue to work with the clinical team to address health care and disease management related needs call 1-800-273-TALK (toll free, 24 hour hotline) If in a crisis, go to [facility] [address] [place] [phone]) call 911 if experiencing a Mental Health or Behavioral Health Crisis  Utilize healthy coping skills and supportive resources discussed Contact PCP with  any questions or concerns Keep 90 percent of counseling appointments Call your insurance provider for more information about your Enhanced Benefits  Check out counseling resources provided  Begin personal counseling with LCSW, to reduce and manage symptoms of Depression and Stress, until well-established with mental health provider Accept all calls from representative with Behavioral Health Providers in an effort to establish ongoing mental health counseling and supportive services. Incorporate into daily practice - relaxation techniques, deep breathing exercises, and mindfulness meditation strategies. Talk about feelings with friends, family members, spiritual advisor, etc. Contact LCSW directly [phone]), if you have questions, need assistance, or if additional social work needs are identified between now and our next scheduled telephone outreach call. Call 988 for mental health hotline/crisis line if needed (24/7 available) Try techniques to reduce symptoms of anxiety/negative thinking (deep breathing, distraction, positive self talk, etc)  - develop a personal safety plan - develop a plan to deal with triggers like holidays, anniversaries - exercise at least 2 to 3 times per week - have a plan for how to handle bad days - journal feelings and what helps to feel better or worse - spend time or talk with others at least 2 to 3 times per week - watch for early signs of feeling worse - begin personal counseling - call and visit an old friend - check out volunteer opportunities - join a support group - laugh; watch a funny movie or comedian - learn and use visualization or guided imagery - perform a random act of kindness - practice relaxation or meditation daily - start or continue a personal journal - practice positive thinking and self-talk -continue with compliance of taking medication  -identify current effective and ineffective coping strategies.  -implement positive  self-talk in care to increase self-esteem, confidence and feelings of control.  -consider alternative and complementary therapy approaches such as meditation, mindfulness or yoga.  Call your insurance provider to gain education on benefits if desired Call your primary care doctor if symptoms get worse  -journaling, prayer, worship services, meditation or pastoral counseling.  -increase participation in pleasurable group activities such as hobbies, singing, sports or volunteering).  -consider the use of meditative movement therapy such as tai chi, yoga or qigong.  -start a regular daily exercise program based on tolerance, ability and patient choice to support positive thinking and activity    Follow Up Plan:  The patient has been provided with contact information for the care management team  and has been advised to call with any mental health or health related questions or concerns.  The care management team will reach out to the patient again over the next 30 business  days.   If you are experiencing a Mental Health or Behavioral Health Crisis or need someone to talk to, please call the Suicide and Crisis Lifeline: 988       Follow up:  Patient agrees to Care Plan and Follow-up.  Plan: The Managed Medicaid care management team will reach out to the patient again over the next 30 days.  Dickie La, BSW, MSW, LCSW Licensed Clinical Social Worker [employer]   [facility] [email or web address] Direct Dial: [phone]

## 2023-06-18 NOTE — Patient Instructions (Signed)
 Visit Information  Colleen Baker was given information about Medicaid Managed Care team care coordination services as a part of their Healthy [facility] Medicaid benefit. Colleen Baker verbally consented to engagement with the [facility] Managed Care team.   If you are experiencing a medical emergency, please call 911 or report to your local emergency department or urgent care.   If you have a non-emergency medical problem during routine business hours, please contact your provider's office and ask to speak with a nurse.   For questions related to your Healthy [facility] health plan, please call: [phone] or visit the homepage here: [email or web address]  If you would like to schedule transportation through your Healthy [facility] plan, please call the following number at least 2 days in advance of your appointment: [phone]  For information about your ride after you set it up, call Ride Assist at [phone]. Use this number to activate a Will Call pickup, or if your transportation is late for a scheduled pickup. Use this number, too, if you need to make a change or cancel a previously scheduled reservation.  If you need transportation services right away, call [phone]. The after-hours call center is staffed 24 hours to handle ride assistance and urgent reservation requests (including discharges) 365 days a year. Urgent trips include sick visits, hospital discharge requests and life-sustaining treatment.  Call the [facility] Line at [phone], at any time, 24 hours a day, 7 days a week. If you are in danger or need immediate medical attention call 911.  If you would like help to quit smoking, call 1-800-QUIT-NOW (954-822-6699) OR Espaol: 1-855-Djelo-Ya (1-884-166-0630) o para ms informacin haga clic aqu or Text READY to 160-109 to register via text  Following is a copy of your plan of care:  Care Plan : LCSW Plan of Care  Updates  made by Colleen Bryant, LCSW since 06/18/2023 12:00 AM     Problem: Coping Skills (General Plan of Care)      Goal: Coping Skills Enhanced   Start Date: 05/08/2023  Note:   Timeframe:  Short-Range Goal Priority:  High Start Date:   05/08/23           Expected End Date:  ongoing                     Follow Up Date -to be determined, this [facility] LCSW will be out of short term leave starting 06/22/23 and patient was made aware  - keep 90 percent of scheduled appointments -consider counseling or psychiatry -consider bumping up your self-care  -consider creating a stronger support network   Why is this important?             Combating depression may take some time.            If you don't feel better right away, don't give up on your treatment plan.    Current barriers:   Chronic Mental Health needs related to symptoms of depression, stress and anxiety. Patient requires Support, Education, Resources, Referrals, Advocacy, and Care Coordination, in order to meet Unmet Mental Health Needs and to find a therapist and psychiatrist. Mobility concerns- uses cane  Mental Health Concerns and Social Isolation Patient lacks knowledge of available community counseling agencies and resources.  Clinical Goal(s): verbalize understanding of plan for management of Anxiety, Depression, and Stress symptoms and demonstrate a reduction in symptoms. Patient will connect with a provider for ongoing mental health treatment, increase coping skills, healthy habits, self-management skills,  and stress reduction       Patient Goals/Self-Care Activities: Take medications as prescribed   Attend all scheduled provider appointments Call pharmacy for medication refills 3-7 days in advance of running out of medications Perform all self care activities independently  Perform IADL's (shopping, preparing meals, housekeeping, managing finances) independently Call provider office for new concerns or questions Work with the social  worker to address care coordination needs and will continue to work with the clinical team to address health care and disease management related needs call 1-800-273-TALK (toll free, 24 hour hotline) If in a crisis, go to [facility] [address] [place] [id number]) call 911 if experiencing a Mental Health or Behavioral Health Crisis  Utilize healthy coping skills and supportive resources discussed Contact PCP with any questions or concerns Keep 90 percent of counseling appointments Call your insurance provider for more information about your Enhanced Benefits  Check out counseling resources provided  Begin personal counseling with LCSW, to reduce and manage symptoms of Depression and Stress, until well-established with mental health provider Accept all calls from representative with Behavioral Health Providers in an effort to establish ongoing mental health counseling and supportive services. Incorporate into daily practice - relaxation techniques, deep breathing exercises, and mindfulness meditation strategies. Talk about feelings with friends, family members, spiritual advisor, etc. Contact LCSW directly [phone]), if you have questions, need assistance, or if additional social work needs are identified between now and our next scheduled telephone outreach call. Call 988 for mental health hotline/crisis line if needed (24/7 available) Try techniques to reduce symptoms of anxiety/negative thinking (deep breathing, distraction, positive self talk, etc)  - develop a personal safety plan - develop a plan to deal with triggers like holidays, anniversaries - exercise at least 2 to 3 times per week - have a plan for how to handle bad days - journal feelings and what helps to feel better or worse - spend time or talk with others at least 2 to 3 times per week - watch for early signs of feeling worse - begin personal counseling - call and visit an  old friend - check out volunteer opportunities - join a support group - laugh; watch a funny movie or comedian - learn and use visualization or guided imagery - perform a random act of kindness - practice relaxation or meditation daily - start or continue a personal journal - practice positive thinking and self-talk -continue with compliance of taking medication  -identify current effective and ineffective coping strategies.  -implement positive self-talk in care to increase self-esteem, confidence and feelings of control.  -consider alternative and complementary therapy approaches such as meditation, mindfulness or yoga.  Call your insurance provider to gain education on benefits if desired Call your primary care doctor if symptoms get worse  -journaling, prayer, worship services, meditation or pastoral counseling.  -increase participation in pleasurable group activities such as hobbies, singing, sports or volunteering).  -consider the use of meditative movement therapy such as tai chi, yoga or qigong.  -start a regular daily exercise program based on tolerance, ability and patient choice to support positive thinking and activity    Follow Up Plan:  The patient has been provided with contact information for the care management team and has been advised to call with any mental health or health related questions or concerns.  The care management team will reach out to the patient again over the next 30 business  days.   If you are experiencing  a Mental Health or Behavioral Health Crisis or need someone to talk to, please call the Suicide and Crisis Lifeline: 92   Dickie La, BSW, MSW, [employer] Licensed Visual merchandiser [employer]   [email or web address] Direct Dial: [phone]

## 2023-06-20 ENCOUNTER — Telehealth: Payer: Self-pay | Admitting: *Deleted

## 2023-06-20 NOTE — Progress Notes (Signed)
 High Risk Managed Medicaid  Care Guide Note  06/20/2023 Name: Colleen Baker MRN: [id number] DOB: [birth year]  Colleen Baker is a 48 y.o. year old female who is a primary care patient of Joaquin Music, NP and is actively engaged with the care management team. I reached out to [name] by phone today to assist with scheduling  with the Licensed Clinical Social Worker.  Follow up plan: Telephone appointment with complex care management team member scheduled for:  4/14  Gwenevere Ghazi  [facility] Health  Value-Based Care Institute, [facility] Guide  Direct Dial: [phone]  Fax [phone]

## 2023-06-20 NOTE — Telephone Encounter (Signed)
 Patient scheduled 4/14  Colleen Baker  [facility] Health  Value-Based Care Institute, [facility] Guide  Direct Dial: [phone]  Fax [phone]

## 2023-06-20 NOTE — Progress Notes (Signed)
 High Risk Managed Medicaid  Care Guide Note  06/20/2023 Name: Colleen Baker MRN: [id number] DOB: October 13, 1975  Colleen Baker is a 48 y.o. year old female who is a primary care patient of Joaquin Music, NP and is actively engaged with the care management team. I reached out to [name] by phone today to assist with scheduling  with the Licensed Clinical Social Worker.  Follow up plan: Unsuccessful telephone outreach attempt made. A HIPAA compliant phone message was left for the patient providing contact information and requesting a return call.  Gwenevere Ghazi  [facility] Health  Value-Based Care Institute, [facility] Guide  Direct Dial: [phone]  Fax [phone]

## 2023-06-25 ENCOUNTER — Telehealth: Payer: Self-pay | Admitting: Adult Health

## 2023-06-25 NOTE — Telephone Encounter (Signed)
 MYC conf

## 2023-06-26 NOTE — Progress Notes (Unsigned)
 [facility] [address] [place].  [address] [phone]       HOSPITAL FOLLOW UP NOTE  [name] Date of Birth:  09-25-1975 Medical Record Number:  [id number]   Reason for Referral:  hospital stroke follow up    SUBJECTIVE:   CHIEF COMPLAINT:  No chief complaint on file.   HPI:   [name] is a 48 y.o. female with history of HTN, LBP, Crohn's disease, fibroid s/p hysterectomy, occasional cigar smoker who presented to ED on 04/20/2023 with difficulty walking and RLE weakness.  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  Further workup as noted below.  Recommended DAPT for 3 weeks and aspirin alone.  LDL 130, initiated atorvastatin 40 mg daily.  Resume home HTN medications except for hydrochlorothiazide due to multiple electrolyte abnormalities and recommended close PCP follow-up.  Smoking cessation counseling provided.  Hypercoagulable labs unremarkable.  Therapies recommended outpatient for residual mild RLE weakness and decreased sensation.        PERTINENT IMAGING  CT no acute finding CTA head and neck Some irregularity of medium size intracranial branches on thick MIPS could be a real finding or technical artifact. I favor technical artifact but she does have several uncontrolled risk factor for premature atherosclerosis MRI  L CR small infarct MRI C-spine - unremarkable 2D Echo EF > 75% LDL 130 HgbA1c 6.1 UDS benzo Hypercoagulable labs unremarkable    ROS:   14 system review of systems performed and negative with exception of ***  PMH:  Past Medical History:  Diagnosis Date   Anemia    allergy induced   Anxiety    Arthritis    Asthma with allergic rhinitis    Chronic back pain    Crohn's disease in remission (HCC)    Depression    Eczema    GERD (gastroesophageal reflux disease)    Headache    Heart murmur    asymptomatic    History of abnormal cervical Pap smear    History  of kidney stones    Hypertension    Irritable bowel    Pinched nerve in neck    Uterine fibroid     PSH:  Past Surgical History:  Procedure Laterality Date   ABDOMINAL HYSTERECTOMY     BUNIONECTOMY Left    CESAREAN SECTION  08/31/2011   Procedure: CESAREAN SECTION;  Surgeon: Freddrick March. Tenny Craw, MD;  Location: [facility];  Service: Gynecology;  Laterality: N/A;   COLONOSCOPY  last one 2015   CYSTOSCOPY  11/06/2018   Procedure: CYSTOSCOPY;  Surgeon: Carrington Clamp, MD;  Location: [facility];  Service: Gynecology;;   DILATATION & CURETTAGE/HYSTEROSCOPY WITH MYOSURE N/A 01/13/2015   Procedure: DILATATION & CURETTAGE/HYSTEROSCOPY WITH  MYOSURE;  Surgeon: Waynard Reeds, MD;  Location: [facility];  Service: Gynecology;  Laterality: N/A;   ganglion cyst removed Right    hiatal hernia repair  as child   LEEP  1990's   MENISCUS REPAIR Right 6 yrs ago   MYOMECTOMY ABDOMINAL APPROACH  02/15/2005   NORPLANT REMOVAL Left 11/06/2018   Procedure: REMOVAL OF NEXPLANON;  Surgeon: Carrington Clamp, MD;  Location: [facility];  Service: Gynecology;  Laterality: Left;   ROBOT ASSISTED MYOMECTOMY  06/2010   ROBOTIC ASSISTED LAPAROSCOPIC HYSTERECTOMY AND SALPINGECTOMY Bilateral 11/06/2018   Procedure: XI ROBOTIC ASSISTED LAPAROSCOPIC HYSTERECTOMY WITH BILATERAL SALPINGECTOMY;  Surgeon: Carrington Clamp, MD;  Location: [facility];  Service: Gynecology;  Laterality:  Bilateral;   SHOULDER SURGERY      Social History:  Social History   Socioeconomic History   Marital status: Single    Spouse name: Not on file   Number of children: Not on file   Years of education: Not on file   Highest education level: Not on file  Occupational History   Not on file  Tobacco Use   Smoking status: Never    Passive exposure: Yes   Smokeless tobacco: Never  Vaping Use   Vaping status: Never Used  Substance and Sexual Activity   Alcohol use: Yes     Comment: occasional   Drug use: No   Sexual activity: Yes    Birth control/protection: Pill, Implant    Comment: to be removed during surgery  Other Topics Concern   Not on file  Social History Narrative   Not on file   Social Drivers of Health   Financial Resource Strain: Medium Risk (06/18/2023)   Overall Financial Resource Strain (CARDIA)    Difficulty of Paying Living Expenses: Somewhat hard  Food Insecurity: No Food Insecurity (05/08/2023)   Hunger Vital Sign    Worried About Running Out of Food in the Last Year: Never true    Ran Out of Food in the Last Year: Never true  Transportation Needs: No Transportation Needs (05/11/2023)   Received from [facility]    In the past 12 months, has lack of reliable transportation kept you from medical appointments, meetings, work or from getting things needed for daily living? : No  Physical Activity: Not on file  Stress: Stress Concern Present (05/08/2023)   [employer] of Occupational Health - Occupational Stress Questionnaire    Feeling of Stress : Rather much  Social Connections: Unknown (08/17/2021)   Received from [facility]   Social Network    Social Network: Not on file  Intimate Partner Violence: Not At Risk (06/18/2023)   Humiliation, Afraid, Rape, and Kick questionnaire    Fear of Current or Ex-Partner: No    Emotionally Abused: No    Physically Abused: No    Sexually Abused: No    Family History:  Family History  Problem Relation Age of Onset   Hypertension Mother    Asthma Mother    Bronchitis Mother    Heart disease Mother    Kidney failure Father        on HD temporary   Kidney disease Father    Asthma Father    Parkinson's disease Father    Eczema Sister    Sinusitis Sister    Asthma Maternal Grandmother    Eczema Maternal Grandmother    Bronchitis Maternal Grandmother    Allergic rhinitis Son    Eczema Son    Urticaria Neg Hx    Immunodeficiency Neg Hx     Angioedema Neg Hx     Medications:   Current Outpatient Medications on File Prior to Visit  Medication Sig Dispense Refill   levocetirizine (XYZAL) 5 MG tablet TAKE UP TO 2 TABLETS TWICE DAILY AS NEEDED FOR HIVES AND SWELLING 120 tablet 5   albuterol (VENTOLIN HFA) 108 (90 Base) MCG/ACT inhaler INHALE 2 PUFFS INTO THE LUNGS EVERY 4 HOURS AS NEEDED FOR WHEEING OR SHORTNESS OF BREATH (Patient taking differently: Inhale 2 puffs into the lungs every 4 (four) hours as needed for wheezing or shortness of breath.) 6.7 g 1   amLODipine (NORVASC) 10 MG tablet Take 10 mg by mouth daily.  atorvastatin (LIPITOR) 40 MG tablet Take 1 tablet (40 mg total) by mouth daily. 30 tablet 0   azelastine (ASTELIN) 0.1 % nasal spray Place 1-2 sprays into both nostrils 2 (two) times daily as needed. (Patient taking differently: Place 1-2 sprays into both nostrils 2 (two) times daily as needed for allergies or rhinitis.) 30 mL 5   Blood Pressure Monitoring (H-E-B INCONTROL BP MONITOR) KIT 1 Device by Does not apply route 3 (three) times daily. 1 kit 0   budesonide-formoterol (SYMBICORT) 160-4.5 MCG/ACT inhaler Take 2 puffs 3-7 days a week for control of asthma symptoms (Patient not taking: Reported on 04/22/2023) 1 each 5   cloNIDine (CATAPRES) 0.1 MG tablet Take 0.1 mg by mouth daily as needed (if B/P is greater than 160/100).     dupilumab (DUPIXENT) 300 MG/2ML prefilled syringe Inject 600 mg into the skin once for 1 dose. Then 300mg  every 14 days (Patient taking differently: Inject 300 mg into the skin every 14 (fourteen) days.) 4 mL 11   escitalopram (LEXAPRO) 10 MG tablet Take 10 mg by mouth daily.     famotidine (PEPCID) 20 MG tablet Take 1 tablet (20 mg total) by mouth daily. (Patient taking differently: Take 20 mg by mouth in the morning.) 30 tablet 3   Ferrous Sulfate (IRON HIGH-POTENCY) 325 MG TABS Take 325 mg by mouth daily with breakfast.     fluticasone (FLONASE) 50 MCG/ACT nasal spray Place 2 sprays into both  nostrils daily as needed for allergies or rhinitis. 16 g 4   guaiFENesin (MUCINEX) 600 MG 12 hr tablet Take 1 tablet (600 mg total) by mouth 2 (two) times daily. 60 tablet 0   hydrocortisone 2.5 % ointment Apply topically twice daily as need to red sandpapery rash. (Patient taking differently: Apply 1 Application topically 2 (two) times daily as needed (to "red, sandpaper-y rash").) 30 g 0   losartan (COZAAR) 50 MG tablet Take 1 tablet (50 mg total) by mouth daily. 90 tablet 0   mesalamine (LIALDA) 1.2 g EC tablet Take 2.4 g by mouth daily with breakfast.     metoprolol succinate (TOPROL-XL) 100 MG 24 hr tablet Take 100 mg by mouth daily. Take with or immediately following a meal.     montelukast (SINGULAIR) 10 MG tablet Take 1 tablet (10 mg total) by mouth at bedtime. 90 tablet 1   pantoprazole (PROTONIX) 40 MG tablet Take 40 mg by mouth 2 (two) times daily before a meal.     potassium chloride SA (KLOR-CON M) 20 MEQ tablet Take 1 tablet (20 mEq total) by mouth daily for 7 days.     tacrolimus (PROTOPIC) 0.1 % ointment Use topically twice daily as needed for red, sandpapery rash (Patient taking differently: Apply 1 Application topically See admin instructions. Use topically twice daily as needed for "red, sandpaper-y rash") 100 g 3   triamcinolone cream (KENALOG) 0.1 % Apply 1 Application topically 2 (two) times daily as needed (itching). Do not use on face, groin or arm pits. 453 g 3   Vitamin D, Ergocalciferol, (DRISDOL) 1.25 MG (50000 UNIT) CAPS capsule Take 50,000 Units by mouth every 7 (seven) days.     No current facility-administered medications on file prior to visit.    Allergies:   Allergies  Allergen Reactions   Aciphex [Rabeprazole Sodium] Hives   Lisinopril Swelling and Other (See Comments)    Facial swelling   Iodinated Contrast Media Hives, Itching and Other (See Comments)    "Contrast dye"  OBJECTIVE:  Physical Exam  There were no vitals filed for this  visit. There is no height or weight on file to calculate BMI. No results found.   General: well developed, well nourished, seated, in no evident distress Head: head normocephalic and atraumatic.   Neck: supple with no carotid or supraclavicular bruits Cardiovascular: regular rate and rhythm, no murmurs Musculoskeletal: no deformity Skin:  no rash/petichiae Vascular:  Normal pulses all extremities   Neurologic Exam Mental Status: Awake and fully alert. Oriented to place and time. Recent and remote memory intact. Attention span, concentration and fund of knowledge appropriate. Mood and affect appropriate.  Cranial Nerves: Fundoscopic exam reveals sharp disc margins. Pupils equal, briskly reactive to light. Extraocular movements full without nystagmus. Visual fields full to confrontation. Hearing intact. Facial sensation intact. Face, tongue, palate moves normally and symmetrically.  Motor: Normal bulk and tone. Normal strength in all tested extremity muscles Sensory.: intact to touch , pinprick , position and vibratory sensation.  Coordination: Rapid alternating movements normal in all extremities. Finger-to-nose and heel-to-shin performed accurately bilaterally. Gait and Station: Arises from chair without difficulty. Stance is normal. Gait demonstrates normal stride length and balance with ***. Tandem walk and heel toe ***.  Reflexes: 1+ and symmetric. Toes downgoing.     NIHSS  *** Modified Rankin  ***      ASSESSMENT: Moira Umholtz is a 48 y.o. year old female with left CR small infarct on 04/20/2023 secondary to small vessel disease source. Vascular risk factors include HTN, HLD, pre-DM and tobacco use.      PLAN:  Ischemic stroke:  Residual deficit: ***.  Continue aspirin 81mg  daily and atorvastatin (Lipitor) for secondary stroke prevention managed/prescribed by PCP.   Discussed secondary stroke prevention measures and importance of close PCP follow up for aggressive stroke  risk factor management including BP goal<130/90, HLD with LDL goal<70 and pre-DM with A1c.<7 .  Stroke labs 04/2023: LDL 130, A1c 6.1 I have gone over the pathophysiology of stroke, warning signs and symptoms, risk factors and their management in some detail with instructions to go to the closest emergency room for symptoms of concern.     Follow up in *** or call earlier if needed   CC:  [facility] provider: Dr. Pearlean Brownie PCP: Joaquin Music, NP     I spent *** minutes of face-to-face and non-face-to-face time with patient.  This included previsit chart review, lab review, study review, order entry, electronic health record documentation, patient education and discussion regarding above diagnoses and treatment plan and answered all other questions to patient's satisfaction   Ihor Austin, [facility]  [facility] [address] [place], [place] [address]  Phone [phone] Fax [phone] Note: This document was prepared with digital dictation and possible smart phrase technology. Any transcriptional errors that result from this process are unintentional.

## 2023-06-27 ENCOUNTER — Encounter: Payer: Self-pay | Admitting: Adult Health

## 2023-06-27 ENCOUNTER — Ambulatory Visit: Payer: Medicaid Other | Admitting: Adult Health

## 2023-06-27 VITALS — BP 160/95 | HR 66 | Ht 70.0 in | Wt 195.0 lb

## 2023-06-27 DIAGNOSIS — R519 Headache, unspecified: Secondary | ICD-10-CM | POA: Diagnosis not present

## 2023-06-27 DIAGNOSIS — R270 Ataxia, unspecified: Secondary | ICD-10-CM | POA: Diagnosis not present

## 2023-06-27 DIAGNOSIS — I69351 Hemiplegia and hemiparesis following cerebral infarction affecting right dominant side: Secondary | ICD-10-CM

## 2023-06-27 DIAGNOSIS — I639 Cerebral infarction, unspecified: Secondary | ICD-10-CM

## 2023-06-27 DIAGNOSIS — Z09 Encounter for follow-up examination after completed treatment for conditions other than malignant neoplasm: Secondary | ICD-10-CM

## 2023-06-27 MED ORDER — NURTEC 75 MG PO TBDP
75.0000 mg | ORAL_TABLET | ORAL | 11 refills | Status: DC | PRN
Start: 2023-06-27 — End: 2023-07-02

## 2023-06-27 MED ORDER — ASPIRIN 81 MG PO TBEC
81.0000 mg | DELAYED_RELEASE_TABLET | Freq: Every day | ORAL | Status: AC
Start: 2023-06-27 — End: ?

## 2023-06-27 MED ORDER — TOPIRAMATE 25 MG PO TABS: 25.0000 mg | ORAL_TABLET | Freq: Two times a day (BID) | ORAL | 11 refills | Status: DC

## 2023-06-27 NOTE — Patient Instructions (Addendum)
 Continue working with therapies for hopeful ongoing recovery  Would recommend holding off on returning to driving at this time until further recovery of right leg weakness and coordination. Consider driving evaluation/rehab - further information provided below  Continue to monitor blood pressure at home and ensure follow up with PCP for further management   Start Topamax 25mg  twice daily for headache prevention   Start Nurtec 75mg  as needed at onset of migraine headache - if medication does not help migraines, please let me know  Restart aspirin 81 mg daily and atorvastatin for secondary stroke prevention  Continue to follow up with PCP regarding blood pressure, cholesterol and pre-diabetes management  Maintain strict control of hypertension with blood pressure goal below 130/90, pre-diabetes with hemoglobin A1c goal below 7.0 % and cholesterol with LDL cholesterol (bad cholesterol) goal below 70 mg/dL.   Signs of a Stroke? Follow the BEFAST method:  Balance Watch for a sudden loss of balance, trouble with coordination or vertigo Eyes Is there a sudden loss of vision in one or both eyes? Or double vision?  Face: Ask the person to smile. Does one side of the face droop or is it numb?  Arms: Ask the person to raise both arms. Does one arm drift downward? Is there weakness or numbness of a leg? Speech: Ask the person to repeat a simple phrase. Does the speech sound slurred/strange? Is the person confused ? Time: If you observe any of these signs, call 911.      Followup in the future with me in 6 months or call earlier if needed       Thank you for coming to see [place] at [facility]. I hope we have been able to provide you high quality care today.  You may receive a patient satisfaction survey over the next few weeks. We would appreciate your feedback and comments so that we may continue to improve ourselves and the health of our patients.      Local Driver  Evaluation Programs:   Comprehensive Evaluation: includes clinical and in vehicle behind the wheel testing by OCCUPATIONAL THERAPIST. Programs have varying levels of adaptive controls available for trial.   [email or web address]-[email or web address] Evaluator:  Elvera Lennox, OT/CDRS/CDI/SCDCM/Low Vision Certification   [facility] [address] [place], [place] [address] [phone] [email or web address] Evaluators:  Rockwell Alexandria, OT and Yves Dill, OT   [facility]) [facility] (ONLY SERVES VETERANS!!) [facility] Services [address] [place], [place]  [address] [phone] [email or web address] Evaluators:  Randall Hiss, KT; Rayburn Felt, KT;  Sheran Luz, KT (KT=kiniesotherapist)     Clinical evaluations only:  Includes clinical testing, refers to other programs or local certified driving instructor for behind the wheel testing.   [facility] (outpatient Rehab) Medical Okey Dupre [address] [place], [place] [address] [phone] for scheduling [email or web address] Evaluators:  Steward Drone, OT; Delma Post, OT   Other area clinical evaluators available upon request including [facility], [facility] and [facility].

## 2023-06-28 ENCOUNTER — Other Ambulatory Visit (HOSPITAL_COMMUNITY): Payer: Self-pay

## 2023-06-28 ENCOUNTER — Telehealth: Payer: Self-pay

## 2023-06-28 ENCOUNTER — Encounter: Payer: Self-pay | Admitting: Neurology

## 2023-06-28 DIAGNOSIS — G43709 Chronic migraine without aura, not intractable, without status migrainosus: Secondary | ICD-10-CM | POA: Insufficient documentation

## 2023-06-28 NOTE — Progress Notes (Signed)
 I agree with the above plan

## 2023-06-28 NOTE — Telephone Encounter (Signed)
 Pharmacy Patient Advocate Encounter   Received notification from Patient Pharmacy that prior authorization for Nurtec 75MG  dispersible tablets is required/requested.   Insurance verification completed.   The patient is insured through  [employer] / IT consultant  .   KEY [id number]

## 2023-06-28 NOTE — Telephone Encounter (Signed)
 Pharmacy Patient Advocate Encounter  Received notification from  [employer]  that Prior Authorization for  Nurtec 75MG  dispersible tablets has been DENIED.  Full denial letter will be uploaded to the media tab. See denial reason below.  In order for [place] to approve this drug, you must meet our criteria. To meet our criteria, your doctor must provide the following information:  You have a diagnosis of migraine headache and all of the following:  You have tried and failed a drug used to treat pain (such as ibuprofen, acetaminophen, etc.). You tried and failed Bernita Raisin ( a prior approval is required). You will not be using another drug of this type, Calcitonin Gene-Related Peptide (CGRP) inhibitor (such as Nurtec or Ubrelvy), to treat or prevent migraines  PA #/Case ID/Reference #: [id number]

## 2023-06-28 NOTE — Telephone Encounter (Signed)
 Added migraine ICD. Pt is limited due to stroke history on what she can't take.  PA completed and submitted through [employer]. UJW:[id number]  Will await determination

## 2023-07-02 ENCOUNTER — Other Ambulatory Visit (HOSPITAL_COMMUNITY): Payer: Self-pay

## 2023-07-02 MED ORDER — UBRELVY 100 MG PO TABS: 100.0000 mg | ORAL_TABLET | ORAL | 5 refills | Status: AC | PRN

## 2023-07-02 NOTE — Addendum Note (Signed)
 Addended by: Judi Cong on: 07/02/2023 08:47 AM   Modules accepted: Orders

## 2023-07-02 NOTE — Telephone Encounter (Signed)
 Insurance is requiring the pt try ubrelvy first. Will place order for that and see about getting PA approved for [place] completed.

## 2023-07-03 ENCOUNTER — Other Ambulatory Visit (HOSPITAL_COMMUNITY): Payer: Self-pay

## 2023-07-04 ENCOUNTER — Other Ambulatory Visit (HOSPITAL_COMMUNITY): Payer: Self-pay

## 2023-07-05 ENCOUNTER — Other Ambulatory Visit (HOSPITAL_COMMUNITY): Payer: Self-pay

## 2023-07-06 ENCOUNTER — Other Ambulatory Visit: Payer: Self-pay

## 2023-07-06 ENCOUNTER — Other Ambulatory Visit (HOSPITAL_COMMUNITY): Payer: Self-pay

## 2023-07-06 ENCOUNTER — Telehealth: Payer: Self-pay

## 2023-07-06 NOTE — Telephone Encounter (Signed)
   I tried to submit PA however her Medicaid believes she has another primary insurance-PT will need to call her case worker or [place] medicaid and have this sorted out before a PA can be submitted.

## 2023-07-10 ENCOUNTER — Other Ambulatory Visit: Payer: Self-pay

## 2023-07-10 ENCOUNTER — Other Ambulatory Visit (HOSPITAL_COMMUNITY): Payer: Self-pay

## 2023-07-30 ENCOUNTER — Other Ambulatory Visit: Payer: Self-pay

## 2023-07-30 ENCOUNTER — Ambulatory Visit: Payer: Self-pay | Admitting: Licensed Clinical Social Worker

## 2023-07-31 NOTE — Patient Outreach (Signed)
 Complex Care Management   Visit Note  07/31/2023  Name:  Colleen Baker MRN: [id number] DOB: March 16, 1976  Situation: Referral received for Complex Care Management related to  community resources  I obtained verbal consent from Patient.  Visit completed with Colleen Baker   on the phone  Background:   Past Medical History:  Diagnosis Date   Anemia    allergy induced   Anxiety    Arthritis    Asthma with allergic rhinitis    Chronic back pain    Crohn's disease in remission (HCC)    Depression    Eczema    GERD (gastroesophageal reflux disease)    Headache    Heart murmur    asymptomatic    History of abnormal cervical Pap smear    History of kidney stones    Hypertension    Irritable bowel    Pinched nerve in neck    Uterine fibroid     Assessment: Patient Reported Symptoms:  Cognitive        Neurological      HEENT        Cardiovascular      Respiratory      Endocrine      Gastrointestinal        Genitourinary      Integumentary      Musculoskeletal          Psychosocial              07/30/2023   11:05 AM  Depression screen PHQ 2/9  Decreased Interest 2  Down, Depressed, Hopeless 3  PHQ - 2 Score 5  Altered sleeping 3  Tired, decreased energy 3  Change in appetite 0  Feeling bad or failure about yourself  0  Trouble concentrating 2  Moving slowly or fidgety/restless 0  Suicidal thoughts 0  PHQ-9 Score 13    There were no vitals filed for this visit.  Medications Reviewed Today     Reviewed by Fletcher Humble, LCSW (Social Worker) on 07/31/23 at 2234  Med List Status: <None>   Medication Order Taking? Sig Documenting Provider Last Dose Status Informant  albuterol (VENTOLIN HFA) 108 (90 Base) MCG/ACT inhaler [id number] No INHALE 2 PUFFS INTO THE LUNGS EVERY 4 HOURS AS NEEDED FOR WHEEING OR SHORTNESS OF BREATH  Patient taking differently: Inhale 2 puffs into the lungs every 4 (four) hours as needed for wheezing or shortness of breath.    Sean Czar, MD Taking Active Self  amLODipine (NORVASC) 10 MG tablet [id number] No Take 10 mg by mouth daily. [provider] Taking Active Self  aspirin EC 81 MG tablet [id number]  Take 1 tablet (81 mg total) by mouth daily. Swallow whole. [name], NP  Active   atorvastatin (LIPITOR) 40 MG tablet [id number]  Take 1 tablet (40 mg total) by mouth daily. Oral Billings, MD  Expired 05/23/23 2359   azelastine (ASTELIN) 0.1 % nasal spray [id number] No Place 1-2 sprays into both nostrils 2 (two) times daily as needed.  Patient taking differently: Place 1-2 sprays into both nostrils 2 (two) times daily as needed for allergies or rhinitis.   Sean Czar, MD Taking Active Self  Blood Pressure Monitoring (H-E-B INCONTROL BP MONITOR) KIT [id number] No 1 Device by Does not apply route 3 (three) times daily. Oral Billings, MD Taking Active   budesonide-formoterol [facility]) 160-4.5 MCG/ACT inhaler [id number] No Take 2 puffs 3-7 days Colleen week for control of asthma symptoms Ambs, Jeanmarie Millet, FNP  Taking Active Self  cloNIDine (CATAPRES) 0.1 MG tablet [id number] No Take 0.1 mg by mouth daily as needed (if B/P is greater than 160/100). [provider] Taking Active Self  escitalopram (LEXAPRO) 10 MG tablet [id number] No Take 10 mg by mouth daily. [provider] Taking Active Self  famotidine (PEPCID) 20 MG tablet [id number] No Take 1 tablet (20 mg total) by mouth daily.  Patient taking differently: Take 20 mg by mouth in the morning.   [name], FNP Taking Active Self  Ferrous Sulfate (IRON HIGH-POTENCY) 325 MG TABS [id number] No Take 325 mg by mouth daily with breakfast. [provider] Taking Active Self  guaiFENesin (MUCINEX) 600 MG 12 hr tablet [id number] No Take 1 tablet (600 mg total) by mouth 2 (two) times daily. Oral Billings, MD Taking Active   hydrocortisone 2.5 % ointment [id number] No Apply topically twice daily as need to red sandpapery rash.  Patient taking  differently: Apply 1 Application topically 2 (two) times daily as needed (to "red, sandpaper-y rash").   Sean Czar, MD Taking Active Self  levocetirizine (XYZAL) 5 MG tablet [id number] No TAKE UP TO 2 TABLETS TWICE DAILY AS NEEDED FOR HIVES AND SWELLING Sean Czar, MD Taking Active   losartan (COZAAR) 50 MG tablet [id number] No Take 1 tablet (50 mg total) by mouth daily. [name], MD Taking Active   mesalamine [name]) 1.2 g EC tablet [id number] No Take 2.4 g by mouth daily with breakfast. [provider] Taking Active Self  metoprolol succinate (TOPROL-XL) 100 MG 24 hr tablet [id number] No Take 100 mg by mouth daily. Take with or immediately following Colleen meal. [provider] Taking Active Self           Med Note [name], [name]   Sun Apr 22, 2023 12:26 PM) I told the patient this looks to have not been filled since almost Colleen year ago and she said she had Colleen surplus of tablets. She stated X2 she DOES take this once Colleen day.  montelukast (SINGULAIR) 10 MG tablet [id number] No Take 1 tablet (10 mg total) by mouth at bedtime. [name], FNP Taking Active Self  pantoprazole (PROTONIX) 40 MG tablet [id number] No Take 40 mg by mouth 2 (two) times daily before Colleen meal. [provider] Taking Active Self  potassium chloride SA (KLOR-CON M) 20 MEQ tablet [id number]  Take 1 tablet (20 mEq total) by mouth daily for 7 days. [name], MD  Expired 04/30/23 2359   tacrolimus (PROTOPIC) 0.1 % ointment [id number] No Use topically twice daily as needed for red, sandpapery rash  Patient taking differently: Apply 1 Application topically See admin instructions. Use topically twice daily as needed for "red, sandpaper-y rash"   Sean Czar, MD Taking Active Self  topiramate (TOPAMAX) 25 MG tablet [id number]  Take 1 tablet (25 mg total) by mouth 2 (two) times daily. [name], NP  Active   triamcinolone cream (KENALOG) 0.1 % [id number] No Apply 1 Application topically 2 (two)  times daily as needed (itching). Do not use on face, groin or arm pits. Sean Czar, MD Taking Active Self  Ubrogepant (UBRELVY) 100 MG TABS [id number]  Take 1 tablet (100 mg total) by mouth as needed (Take 1 tab at onset of migraine,may repeat an additional tablet 2 hrs if needed (Do NOT exceed more than 2 tabl). [name], NP  Active   Vitamin D, Ergocalciferol, (DRISDOL) 1.25 MG (50000 UNIT) CAPS capsule [id number]  No Take 50,000 Units by mouth every 7 (seven) days. [provider] Taking Active Self           Med Note [name], [name]   Sun Apr 22, 2023 12:21 PM) Takes on no specific day of the week            Recommendation:   Continue with self care activities, continue with therapy and prioritze rest   Follow Up Plan:   Telephone follow up appointment date/time:  08/13/2023  Fletcher Humble MSW, LCSW Licensed Clinical Social Worker  [facility], Population Health Direct Dial: [phone]  Fax: [phone]

## 2023-07-31 NOTE — Patient Instructions (Addendum)
 Visit Information  Thank you for taking time to visit with me today. Please don't hesitate to contact me if I can be of assistance to you before our next scheduled appointment.  Your next care management appointment is by telephone on 08/13/2023 at 1130am  Telephone follow-up 08/13/2023  Please call the care guide team at [phone] if you need to cancel, schedule, or reschedule an appointment.   Please call 911 if you are experiencing a Mental Health or Behavioral Health Crisis or need someone to talk to.  Fletcher Humble MSW, LCSW Licensed Clinical Social Worker  [facility], Population Health Direct Dial: [phone]  Fax: [phone]

## 2023-08-01 ENCOUNTER — Other Ambulatory Visit: Payer: Self-pay

## 2023-08-01 ENCOUNTER — Telehealth: Payer: Self-pay

## 2023-08-01 NOTE — Telephone Encounter (Signed)
 Per call center patient needs a PA on Dupixent through [employer].

## 2023-08-01 NOTE — Progress Notes (Signed)
 Specialty Pharmacy Refill Coordination Note  Colleen Baker is a 48 y.o. female contacted today regarding refills of specialty medication(s) Dupilumab (Dupixent)   Patient requested Delivery   Delivery date: 08/03/23   Verified address: [address] [place]   Medication will be filled on 08/02/23.

## 2023-08-13 ENCOUNTER — Other Ambulatory Visit: Payer: Self-pay | Admitting: Licensed Clinical Social Worker

## 2023-08-13 NOTE — Patient Instructions (Signed)
 Visit Information  Thank you for taking time to visit with me today. Please don't hesitate to contact me if I can be of assistance to you before our next scheduled appointment.   Patient has met all care management goals. Care Management case will be closed. Patient has been provided contact information should new needs arise.   Please call the care guide team at [phone] if you need to cancel, schedule, or reschedule an appointment.   Please call 911 if you are experiencing a Mental Health or Behavioral Health Crisis or need someone to talk to.  Gwyndolyn Saxon MSW, LCSW Licensed Clinical Social Worker  [facility], Population Health Direct Dial: [phone]  Fax: [phone]

## 2023-08-13 NOTE — Patient Outreach (Signed)
 Complex Care Management   Visit Note  08/13/2023  Name:  Colleen Baker MRN: [id number] DOB: 27-Aug-1975  Situation: Referral received for Complex Care Management related to SDOH Barriers:  community resources  I obtained verbal consent from Patient.  Visit completed with A Crigler  on the phone  Background:   Past Medical History:  Diagnosis Date   Anemia    allergy induced   Anxiety    Arthritis    Asthma with allergic rhinitis    Chronic back pain    Crohn's disease in remission (HCC)    Depression    Eczema    GERD (gastroesophageal reflux disease)    Headache    Heart murmur    asymptomatic    History of abnormal cervical Pap smear    History of kidney stones    Hypertension    Irritable bowel    Pinched nerve in neck    Uterine fibroid     Assessment: Patient Reported Symptoms:  Cognitive        Neurological      HEENT        Cardiovascular      Respiratory      Endocrine      Gastrointestinal        Genitourinary      Integumentary      Musculoskeletal          Psychosocial              07/30/2023   11:05 AM  Depression screen PHQ 2/9  Decreased Interest 2  Down, Depressed, Hopeless 3  PHQ - 2 Score 5  Altered sleeping 3  Tired, decreased energy 3  Change in appetite 0  Feeling bad or failure about yourself  0  Trouble concentrating 2  Moving slowly or fidgety/restless 0  Suicidal thoughts 0  PHQ-9 Score 13    There were no vitals filed for this visit.  Medications Reviewed Today   Medications were not reviewed in this encounter     Recommendation:   Pt will continue therapy with current mental health professional. Pt also advised to contact [facility] to drive program when ready for driving assessment.   Follow Up Plan:   Patient has met all care management goals. Care Management case will be closed. Patient has been provided contact information should new needs arise.   Fletcher Humble MSW, LCSW Licensed Clinical  Social Worker  [facility], Population Health Direct Dial: [phone]  Fax: [phone]

## 2023-08-15 ENCOUNTER — Telehealth: Payer: Self-pay | Admitting: Adult Health

## 2023-08-15 NOTE — Telephone Encounter (Signed)
 Spoke w/Pt regarding numbness in left hand. Pt had residual left side weakness following stroke with some numbness at times. Pt states this feels different and is more of a tingling feeling but is not constant and time of day or activity doesn't matter. Pt stated when holds arms out in front of her she is able to maintain her left arm in the position. Pt denies having greater weakness in left hand. Pt stated has felt like left eye is twitching and vision blurs slightly but when she rubs her eye the twitching stops and vision clears. Pt denies sudden loss of vision, increased weakness of left arm or left leg, slurred speech, difficulty smiling. Pt stated she has PT appt tomorrow. Recommended having PT evaluate for symetry and strength since the tingling has started. Did encourage Pt if symptoms worsen or she starts to have any of the other signs discussed to get to the ED for evaluation. Pt stated understanding and thanked for the call back.

## 2023-08-15 NOTE — Telephone Encounter (Signed)
 Pt called wanting to speak to the nurse regarding some numbness she is experiencing on her L side. She stated that she does not know if this has to do with her past stroke or if this is a new situation. Please advise.

## 2023-08-20 ENCOUNTER — Other Ambulatory Visit: Payer: Self-pay

## 2023-08-23 ENCOUNTER — Other Ambulatory Visit: Payer: Self-pay

## 2023-08-23 NOTE — Progress Notes (Signed)
 Specialty Pharmacy Refill Coordination Note  Colleen Baker is a 48 y.o. female contacted today regarding refills of specialty medication(s) Dupilumab  (Dupixent )   Patient requested Delivery   Delivery date: 08/29/23   Verified address: [address]  [place] [address]   Medication will be filled on 08/28/23.

## 2023-08-28 ENCOUNTER — Other Ambulatory Visit: Payer: Self-pay

## 2023-09-17 NOTE — Progress Notes (Signed)
 [address] [place] [address] Dept: [phone]  FOLLOW UP NOTE  Patient ID: Colleen Baker, female    DOB: [birth year]  Age: 48 y.o. MRN: [id number] Date of Office Visit: 09/18/2023  Assessment  Chief Complaint: Follow-up  HPI Colleen Baker is a 48 year old female who presents to the clinic for a follow-up visit.  She was last seen in clinic on 03/20/2023 by Rexene Catching, FNP, for evaluation of asthma, allergic rhinitis, allergic conjunctivitis, and atopic dermatitis. 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   Today's visit, she reports her asthma has been moderately well-controlled with occasional shortness of breath and cough producing clear mucus over the last week for which she used albuterol  with relief of symptoms.  She continues montelukast  10 mg once a day and rarely needs to use albuterol .  She reports that she has not used Symbicort  160 since her last visit to this clinic.  She continues Dupixent  injections 300 mg once every 2 weeks for atopic dermatitis, however, reports that her asthma symptoms have improved significantly while continuing on Dupixent  injections.   Allergic rhinitis is reported as moderately well-controlled with sneezing and postnasal drainage as the main symptoms.  She continues Xyzal  as needed and is not currently using Flonase  or azelastine .  She is not currently using nasal saline rinses.  Her last environmental allergy skin testing was on 03/19/2019 was positive to mold 3, mold 4, and dust mite.  Allergic conjunctivitis is reported as well-controlled with no medical intervention at this time.  Atopic dermatitis is reported as moderately well-controlled with only infrequent red and itchy areas occurring in a flare in remission pattern.  She continues Xyzal  as needed and occasionally uses hydrocortisone  for relief of symptoms.  She  continues Dupixent  injections 300 mg once every 2 weeks with no large or local reactions.  She reports a significant decrease in her symptoms of atopic dermatitis while continuing on Dupixent .  Reflux is reported as moderately well-controlled with frequent episodes of heartburn and nausea.  She continues to follow-up with her GI specialist for management and treatment of Crohn's disease.  Her current medications are listed in the chart.  Drug Allergies:  Allergies  Allergen Reactions   Aciphex [Rabeprazole Sodium] Hives   Lisinopril Swelling and Other (See Comments)    Facial swelling   Iodinated Contrast Media Hives, Itching and Other (See Comments)    "Contrast dye"    Physical Exam: BP (!) 148/102 Comment: pt aware and recently changed bp meds  Pulse 74   Temp (!) 97.2 F (36.2 C) (Temporal)   Wt 182 lb 12.8 oz (82.9 kg)   LMP 10/31/2018   SpO2 99%   BMI 26.23 kg/m    Physical Exam Vitals reviewed.  Constitutional:      Appearance: Normal appearance.  HENT:     Head: Normocephalic and atraumatic.     Right Ear: Tympanic membrane normal.     Left Ear: Tympanic membrane normal.     Nose:     Comments: Bilateral nares slightly erythematous with thin clear nasal drainage noted.  Pharynx normal.  Ears normal.  Eyes normal.    Mouth/Throat:     Pharynx: Oropharynx is clear.  Eyes:     Conjunctiva/sclera: Conjunctivae normal.  Cardiovascular:     Rate and Rhythm: Normal rate and regular rhythm.     Heart sounds:  Normal heart sounds. No murmur heard. Pulmonary:     Effort: Pulmonary effort is normal.     Breath sounds: Normal breath sounds.     Comments: Lungs clear to auscultation Musculoskeletal:        General: Normal range of motion.     Cervical back: Normal range of motion and neck supple.  Skin:    General: Skin is warm and dry.  Neurological:     Mental Status: She is alert and oriented to person, place, and time.  Psychiatric:        Mood and Affect: Mood  normal.        Behavior: Behavior normal.        Thought Content: Thought content normal.        Judgment: Judgment normal.     Diagnostics: FVC 2.54 which is 69% of predicted value, FEV1 2.05 which is 70% of predicted value.  Spirometry indicates possible restriction.  Assessment and Plan: 1. Moderate persistent asthma without complication   2. Seasonal and perennial allergic rhinitis   3. Seasonal allergic conjunctivitis   4. Dermatitis   5. Gastroesophageal reflux disease, unspecified whether esophagitis present   6. Chronic urticaria     Meds ordered this encounter  Medications   albuterol  (VENTOLIN  HFA) 108 (90 Base) MCG/ACT inhaler    Sig: INHALE 2 PUFFS INTO THE LUNGS EVERY 4 HOURS AS NEEDED FOR WHEEING OR SHORTNESS OF BREATH    Dispense:  18 g    Refill:  1   azelastine  (ASTELIN ) 0.1 % nasal spray    Sig: Place 1-2 sprays into both nostrils 2 (two) times daily as needed.    Dispense:  30 mL    Refill:  5   budesonide -formoterol  (SYMBICORT ) 160-4.5 MCG/ACT inhaler    Sig: Take 2 puffs 3-7 days a week for control of asthma symptoms    Dispense:  1 each    Refill:  5   levocetirizine (XYZAL ) 5 MG tablet    Sig: Take up to 2 tablets twice daily as needed for hives and swelling    Dispense:  120 tablet    Refill:  5   montelukast  (SINGULAIR ) 10 MG tablet    Sig: Take 1 tablet (10 mg total) by mouth at bedtime.    Dispense:  90 tablet    Refill:  1    Please dispense 90 day supply    Patient Instructions  Asthma Continue with montelukast  10 mg once a day to prevent cough or wheeze Continue with Symbicort  160-2 puffs 3-7 days a week with a spacer to prevent cough or wheeze. Daily Symbicort  use will provide better control of your asthma Continue albuterol  2 puffs once every 4 hours if needed for cough or wheeze You may use albuterol  2 puffs 5 to 15 minutes before activity to decrease cough or wheeze Continue Dupixent  300 mg once every 2 weeks for control of asthma  symptoms Continue to follow-up with your pulmonology group at [facility] as recommended   Allergic rhinitis Continue allergen avoidance measures directed toward indoor and outdoor molds as well as dust mites as listed below Continue Flonase  2 sprays in each nostril once a day as needed for stuffy nose.  In the right nostril, point the applicator out toward the right ear. In the left nostril, point the applicator out toward the left ear Continue montelukast  as listed above for allergy symptom control Continue Xyzal  5 mg once a day as needed for  runny nose or itch Continue azelastine  2 sprays in each nostril up to twice a day as needed for runny nose Consider saline nasal rinses as needed for nasal symptoms. Use this before any medicated nasal sprays for best result Consider allergen immunotherapy if your symptoms are not well-controlled with the treatment plan as listed above  Allergic conjunctivitis Some over the counter eye drops include Pataday one drop in each eye once a day as needed for red, itchy eyes OR Zaditor one drop in each eye twice a day as needed for red itchy eyes. Avoid eye drops that say red eye relief as they may contain medications that dry out your eyes.   Atopic Dermatitis:  Daily Care For Maintenance (daily and continue even once eczema controlled) - Use hypoallergenic hydrating ointment at least twice daily.  This must be done daily for control of flares. (Great options include Vaseline, CeraVe, Aquaphor, Aveeno, Cetaphil, VaniCream, etc) - Avoid detergents, soaps or lotions with fragrances/dyes - Limit showers/baths to 5 minutes and use luke warm water  instead of hot, pat dry following baths, and apply moisturizer - can use steroid/non-steroid therapy creams as detailed below up to twice weekly for prevention of flares.   For Flares:(add this to maintenance therapy if needed for flares) First apply steroid/non-steroid treatment creams.  Wait 5 minutes then apply moisturizer.  - Triamcinolone  0.1% to body for moderate flares-apply topically twice daily to red, raised areas of skin, followed by moisturizer. Do NOT use on face, groin or armpits. - Hydrocortisone  2.5% to face/body-apply topically twice daily to red, raised areas of skin, followed by moisturizer - Non-steroid treatment options:   tacrolimus   apply topically twice daily as needed (can use in place of steroid creams if desires) - Continue Dupixent  injections for control of atopic dermatitis  Reflux Continue dietary and lifestyle modifications Follow-up with your GI specialist as recommended  Urticaria (Hives) Take the lease amount of medications while remaining hive free Levocetirizine (Xyzal ) 5 mg twice a day and famotidine  (Pepcid ) 20 mg twice a day. If no symptoms for 7-14 days then decrease to. Levocetirizine (Xyzal ) 5 mg twice a day and famotidine  (Pepcid ) 20 mg once a day.  If no symptoms for 7-14 days then decrease to. Levocetirizine (Xyzal ) 5 mg twice a day.  If no symptoms for 7-14 days then decrease to. Levocetirizine (Xyzal ) 5 mg once a day.    Your blood pressure was elevated at today's visit.  Follow-up with your primary provider soon as possible for further evaluation and treatment of your blood pressure  Call the clinic if this treatment plan is not working well for you.  Follow up in 6 months or sooner if needed.  Return in about 6 months (around 03/19/2024).    Thank you for the opportunity to care for this patient.  Please do not hesitate to contact me with questions.  Marinus Sic, FNP [facility] 

## 2023-09-17 NOTE — Patient Instructions (Addendum)
 Asthma Continue with montelukast  10 mg once a day to prevent cough or wheeze Continue with Symbicort  160-2 puffs 3-7 days a week with a spacer to prevent cough or wheeze. Daily Symbicort  use will provide better control of your asthma Continue albuterol  2 puffs once every 4 hours if needed for cough or wheeze You may use albuterol  2 puffs 5 to 15 minutes before activity to decrease cough or wheeze Continue Dupixent  300 mg once every 2 weeks for control of asthma symptoms Continue to follow-up with your pulmonology group at [facility] as recommended   Allergic rhinitis Continue allergen avoidance measures directed toward indoor and outdoor molds as well as dust mites as listed below Continue Flonase  2 sprays in each nostril once a day as needed for stuffy nose.  In the right nostril, point the applicator out toward the right ear. In the left nostril, point the applicator out toward the left ear Continue montelukast  as listed above for allergy symptom control Continue Xyzal  5 mg once a day as needed for runny nose or itch Continue azelastine  2 sprays in each nostril up to twice a day as needed for runny nose Consider saline nasal rinses as needed for nasal symptoms. Use this before any medicated nasal sprays for best result Consider allergen immunotherapy if your symptoms are not well-controlled with the treatment plan as listed above  Allergic conjunctivitis Some over the counter eye drops include Pataday one drop in each eye once a day as needed for red, itchy eyes OR Zaditor one drop in each eye twice a day as needed for red itchy eyes. Avoid eye drops that say red eye relief as they may contain medications that dry out your eyes.   Atopic Dermatitis:  Daily Care For Maintenance (daily and continue even once eczema controlled) - Use hypoallergenic hydrating ointment at least twice daily.  This must be done daily for control of flares. (Great options include  Vaseline, CeraVe, Aquaphor, Aveeno, Cetaphil, VaniCream, etc) - Avoid detergents, soaps or lotions with fragrances/dyes - Limit showers/baths to 5 minutes and use luke warm water  instead of hot, pat dry following baths, and apply moisturizer - can use steroid/non-steroid therapy creams as detailed below up to twice weekly for prevention of flares.   For Flares:(add this to maintenance therapy if needed for flares) First apply steroid/non-steroid treatment creams. Wait 5 minutes then apply moisturizer.  - Triamcinolone  0.1% to body for moderate flares-apply topically twice daily to red, raised areas of skin, followed by moisturizer. Do NOT use on face, groin or armpits. - Hydrocortisone  2.5% to face/body-apply topically twice daily to red, raised areas of skin, followed by moisturizer - Non-steroid treatment options:   tacrolimus   apply topically twice daily as needed (can use in place of steroid creams if desires) - Continue Dupixent  injections for control of atopic dermatitis  Reflux Continue dietary and lifestyle modifications Follow-up with your GI specialist as recommended  Urticaria (Hives) Take the lease amount of medications while remaining hive free Levocetirizine (Xyzal ) 5 mg twice a day and famotidine  (Pepcid ) 20 mg twice a day. If no symptoms for 7-14 days then decrease to. Levocetirizine (Xyzal ) 5 mg twice a day and famotidine  (Pepcid ) 20 mg once a day.  If no symptoms for 7-14 days then decrease to. Levocetirizine (Xyzal ) 5 mg twice a day.  If no symptoms for 7-14 days then decrease to. Levocetirizine (Xyzal ) 5 mg once a day.    Your blood pressure was elevated at today's  visit.  Follow-up with your primary provider soon as possible for further evaluation and treatment of your blood pressure  Call the clinic if this treatment plan is not working well for you.  Follow up in 6 months or sooner if needed.  Control of Mold Allergen Mold and fungi can grow on a variety of  surfaces provided certain temperature and moisture conditions exist.  Outdoor molds grow on plants, decaying vegetation and soil.  The major outdoor mold, Alternaria and Cladosporium, are found in very high numbers during hot and dry conditions.  Generally, a late Summer - Fall peak is seen for common outdoor fungal spores.  Rain will temporarily lower outdoor mold spore count, but counts rise rapidly when the rainy period ends.  The most important indoor molds are Aspergillus and Penicillium.  Dark, humid and poorly ventilated basements are ideal sites for mold growth.  The next most common sites of mold growth are the bathroom and the kitchen.  Outdoor [employer] Use air conditioning and keep windows closed Avoid exposure to decaying vegetation. Avoid leaf raking. Avoid grain handling. Consider wearing a face mask if working in moldy areas.  Indoor Mold Control Maintain humidity below 50%. Clean washable surfaces with 5% bleach solution. Remove sources e.g. Contaminated carpets.   Control of Dust Mite Allergen Dust mites play a major role in allergic asthma and rhinitis. They occur in environments with high humidity wherever human skin is found. Dust mites absorb humidity from the atmosphere (ie, they do not drink) and feed on organic matter (including shed human and animal skin). Dust mites are a microscopic type of insect that you cannot see with the naked eye. High levels of dust mites have been detected from mattresses, pillows, carpets, upholstered furniture, bed covers, clothes, soft toys and any woven material. The principal allergen of the dust mite is found in its feces. A gram of dust may contain 1,000 mites and 250,000 fecal particles. Mite antigen is easily measured in the air during house cleaning activities. Dust mites do not bite and do not cause harm to humans, other than by triggering allergies/asthma.  Ways to decrease your exposure to dust mites in your home:  1. Encase  mattresses, box springs and pillows with a mite-impermeable barrier or cover  2. Wash sheets, blankets and drapes weekly in hot water  (130 F) with detergent and dry them in a dryer on the hot setting.  3. Have the room cleaned frequently with a vacuum cleaner and a damp dust-mop. For carpeting or rugs, vacuuming with a vacuum cleaner equipped with a high-efficiency particulate air (HEPA) filter. The dust mite allergic individual should not be in a room which is being cleaned and should wait 1 hour after cleaning before going into the room.  4. Do not sleep on upholstered furniture (eg, couches).  5. If possible removing carpeting, upholstered furniture and drapery from the home is ideal. Horizontal blinds should be eliminated in the rooms where the person spends the most time (bedroom, study, television room). Washable vinyl, roller-type shades are optimal.  6. Remove all non-washable stuffed toys from the bedroom. Wash stuffed toys weekly like sheets and blankets above.  7. Reduce indoor humidity to less than 50%. Inexpensive humidity monitors can be purchased at most hardware stores. Do not use a humidifier as can make the problem worse and are not recommended.

## 2023-09-18 ENCOUNTER — Encounter: Payer: Self-pay | Admitting: Family Medicine

## 2023-09-18 ENCOUNTER — Ambulatory Visit: Payer: Medicaid Other | Admitting: Family Medicine

## 2023-09-18 VITALS — BP 148/102 | HR 74 | Temp 97.2°F | Wt 182.8 lb

## 2023-09-18 DIAGNOSIS — J3089 Other allergic rhinitis: Secondary | ICD-10-CM

## 2023-09-18 DIAGNOSIS — H1013 Acute atopic conjunctivitis, bilateral: Secondary | ICD-10-CM

## 2023-09-18 DIAGNOSIS — H1010 Acute atopic conjunctivitis, unspecified eye: Secondary | ICD-10-CM

## 2023-09-18 DIAGNOSIS — J4540 Moderate persistent asthma, uncomplicated: Secondary | ICD-10-CM | POA: Diagnosis not present

## 2023-09-18 DIAGNOSIS — L309 Dermatitis, unspecified: Secondary | ICD-10-CM | POA: Diagnosis not present

## 2023-09-18 DIAGNOSIS — K219 Gastro-esophageal reflux disease without esophagitis: Secondary | ICD-10-CM

## 2023-09-18 DIAGNOSIS — L508 Other urticaria: Secondary | ICD-10-CM

## 2023-09-18 DIAGNOSIS — J302 Other seasonal allergic rhinitis: Secondary | ICD-10-CM

## 2023-09-18 MED ORDER — MONTELUKAST SODIUM 10 MG PO TABS: 10.0000 mg | ORAL_TABLET | Freq: Every day | ORAL | 1 refills | Status: AC

## 2023-09-18 MED ORDER — LEVOCETIRIZINE DIHYDROCHLORIDE 5 MG PO TABS: ORAL_TABLET | ORAL | 5 refills | Status: AC

## 2023-09-18 MED ORDER — ALBUTEROL SULFATE HFA 108 (90 BASE) MCG/ACT IN AERS: INHALATION_SPRAY | RESPIRATORY_TRACT | 1 refills | Status: DC

## 2023-09-18 MED ORDER — BUDESONIDE-FORMOTEROL FUMARATE 160-4.5 MCG/ACT IN AERO: INHALATION_SPRAY | RESPIRATORY_TRACT | 5 refills | Status: DC

## 2023-09-18 MED ORDER — AZELASTINE HCL 0.1 % NA SOLN: 1.0000 | Freq: Two times a day (BID) | NASAL | 5 refills | Status: AC | PRN

## 2023-10-02 ENCOUNTER — Other Ambulatory Visit: Payer: Self-pay

## 2023-10-04 ENCOUNTER — Other Ambulatory Visit: Payer: Self-pay | Admitting: Pharmacy Technician

## 2023-10-04 ENCOUNTER — Other Ambulatory Visit: Payer: Self-pay

## 2023-10-04 NOTE — Progress Notes (Signed)
 Specialty Pharmacy Refill Coordination Note  Colleen Baker is a 48 y.o. female contacted today regarding refills of specialty medication(s) Dupilumab  (Dupixent )   Patient requested Delivery   Delivery date: 10/05/23   Verified address: [address] [place]   Medication will be filled on 10/04/23.

## 2023-10-30 ENCOUNTER — Other Ambulatory Visit: Payer: Self-pay

## 2023-10-30 ENCOUNTER — Other Ambulatory Visit: Payer: Self-pay | Admitting: Pharmacy Technician

## 2023-10-30 NOTE — Progress Notes (Signed)
 Patient called to inform correct date of injection.   Patient next injection date is 7/29 and request to have medication sent earlier.  Medication fill 7/22 and deliver by 7/23 for 7/29 injection.

## 2023-10-30 NOTE — Progress Notes (Signed)
 Specialty Pharmacy Refill Coordination Note  Colleen Baker is a 48 y.o. female contacted today regarding refills of specialty medication(s) Dupilumab  (Dupixent )   Patient requested Delivery   Delivery date: 11/14/23   Verified address: [address]  [place]   Medication will be filled on 11/13/23.

## 2023-11-06 ENCOUNTER — Other Ambulatory Visit: Payer: Self-pay

## 2023-11-22 IMAGING — DX DG CHEST 1V PORT
1 series · 1 of 1 positions shown · non-contrast
Comparison: 09/22/2020

CLINICAL DATA: Cough.

EXAM:
PORTABLE CHEST 1 VIEW

[chest ap]
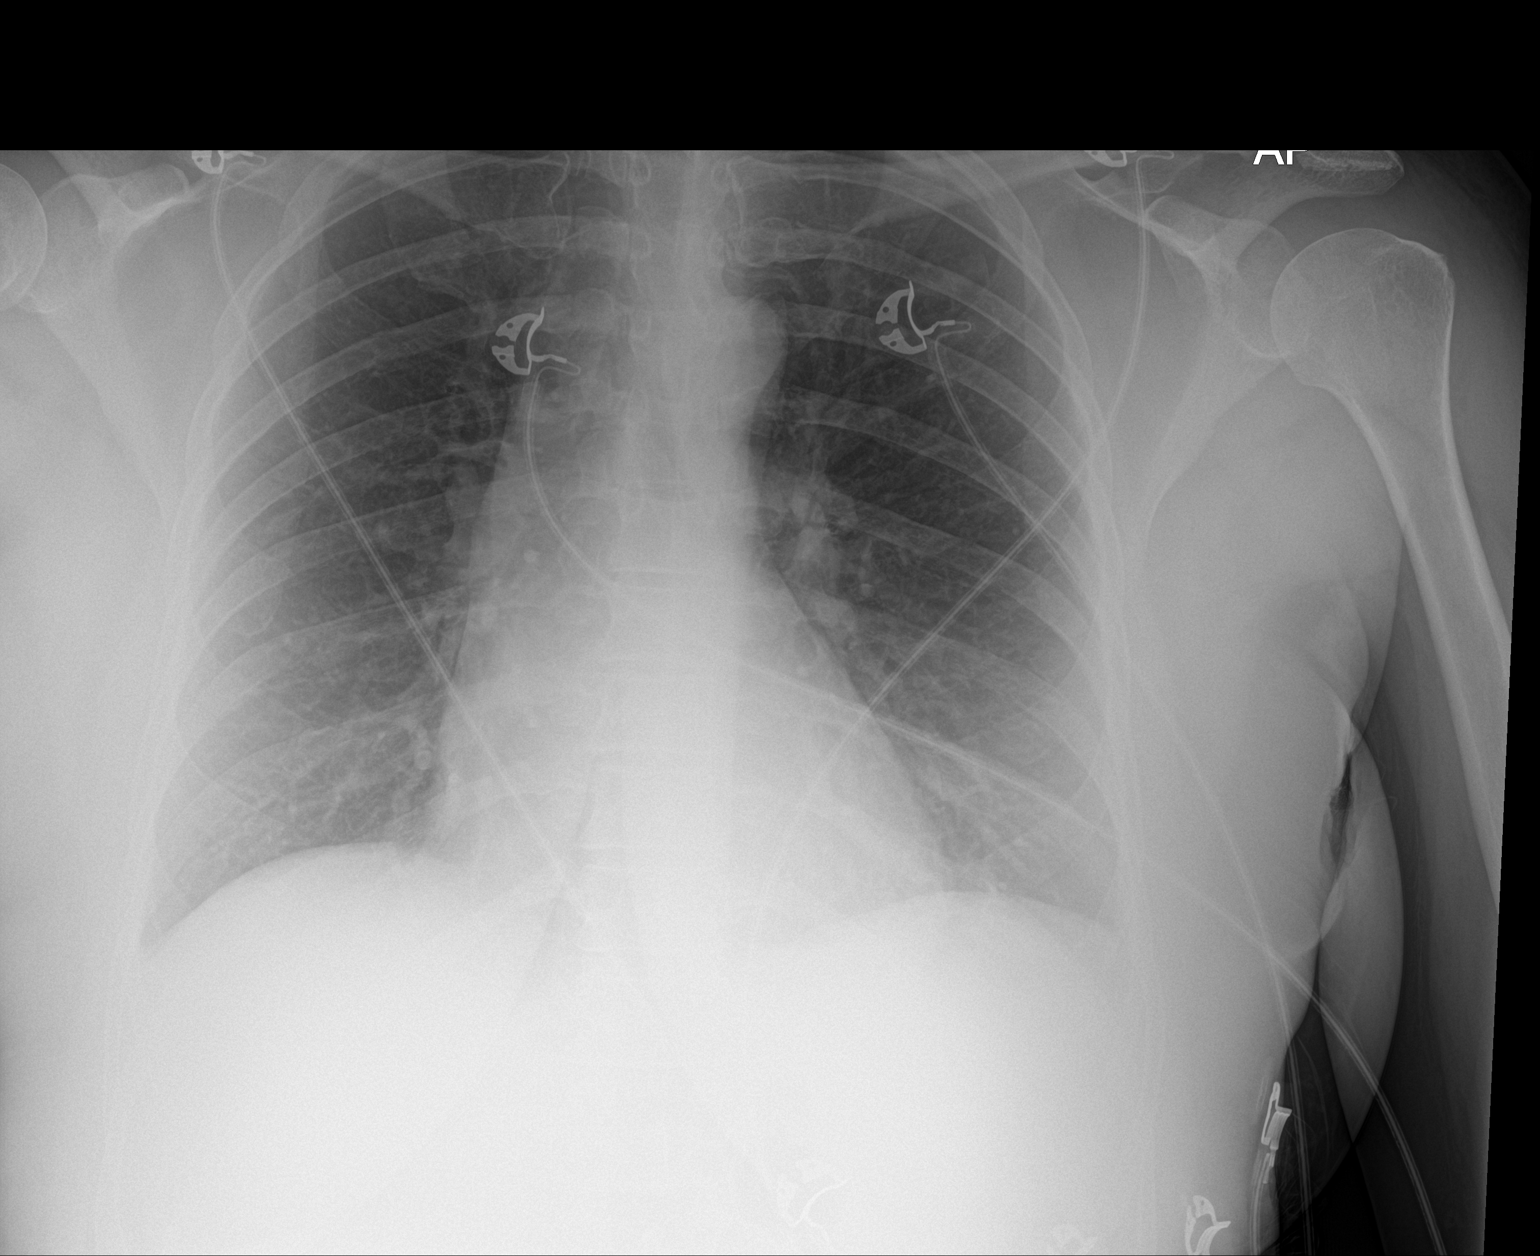

[1 of 1 positions shown; findings below may reference images not displayed]

FINDINGS: 3283 hours The cardiopericardial silhouette is within normal limits
for size. The lungs are clear without focal pneumonia, edema,
pneumothorax or pleural effusion. The visualized bony structures of
the thorax show no acute abnormality. Telemetry leads overlie the
chest.
IMPRESSION: No active disease.

## 2023-11-29 ENCOUNTER — Other Ambulatory Visit: Payer: Self-pay

## 2023-11-29 NOTE — Progress Notes (Signed)
 Specialty Pharmacy Refill Coordination Note  Colleen Baker is a 48 y.o. female contacted today regarding refills of specialty medication(s) Dupilumab (Dupixent)   Patient requested Delivery   Delivery date: 12/05/23   Verified address: [address]  [place]   Medication will be filled on 12/04/23.

## 2023-12-04 ENCOUNTER — Other Ambulatory Visit: Payer: Self-pay

## 2023-12-27 ENCOUNTER — Other Ambulatory Visit (HOSPITAL_COMMUNITY): Payer: Self-pay

## 2023-12-27 ENCOUNTER — Other Ambulatory Visit: Payer: Self-pay

## 2023-12-27 ENCOUNTER — Other Ambulatory Visit: Payer: Self-pay | Admitting: Internal Medicine

## 2023-12-27 MED ORDER — DUPIXENT 300 MG/2ML ~~LOC~~ SOSY
300.0000 mg | PREFILLED_SYRINGE | SUBCUTANEOUS | 11 refills | Status: AC
  Filled 2023-12-27 – 2024-01-02 (×3): qty 4, 28d supply, fill #0
  Filled 2024-01-25: qty 4, 28d supply, fill #1
  Filled 2024-02-21: qty 4, 28d supply, fill #2
  Filled 2024-03-25 – 2024-03-26 (×2): qty 4, 28d supply, fill #3
  Filled 2024-04-24: qty 4, 28d supply, fill #4

## 2023-12-31 ENCOUNTER — Other Ambulatory Visit: Payer: Self-pay

## 2024-01-02 ENCOUNTER — Other Ambulatory Visit: Payer: Self-pay

## 2024-01-02 ENCOUNTER — Other Ambulatory Visit: Payer: Self-pay | Admitting: Pharmacy Technician

## 2024-01-02 NOTE — Progress Notes (Signed)
 Specialty Pharmacy Refill Coordination Note  Colleen Baker is a 48 y.o. female contacted today regarding refills of specialty medication(s) Dupilumab  (Dupixent )   Patient requested Delivery   Delivery date: 01/03/24   Verified address: [address] [place]   Medication will be filled on 01/02/24.

## 2024-01-17 ENCOUNTER — Telehealth: Payer: Self-pay

## 2024-01-17 DIAGNOSIS — L309 Dermatitis, unspecified: Secondary | ICD-10-CM

## 2024-01-17 MED ORDER — TRIAMCINOLONE ACETONIDE 0.1 % EX CREA: 1.0000 | TOPICAL_CREAM | Freq: Two times a day (BID) | CUTANEOUS | 3 refills | Status: AC | PRN

## 2024-01-17 NOTE — Telephone Encounter (Signed)
 Request medication refill on triamcinolone  Rx sent to pharmacy

## 2024-01-23 ENCOUNTER — Other Ambulatory Visit: Payer: Self-pay

## 2024-01-24 ENCOUNTER — Telehealth: Payer: Self-pay | Admitting: Adult Health

## 2024-01-24 ENCOUNTER — Ambulatory Visit: Admitting: Adult Health

## 2024-01-24 DIAGNOSIS — F331 Major depressive disorder, recurrent, moderate: Secondary | ICD-10-CM | POA: Insufficient documentation

## 2024-01-24 DIAGNOSIS — E119 Type 2 diabetes mellitus without complications: Secondary | ICD-10-CM | POA: Insufficient documentation

## 2024-01-24 DIAGNOSIS — R413 Other amnesia: Secondary | ICD-10-CM | POA: Insufficient documentation

## 2024-01-24 DIAGNOSIS — E611 Iron deficiency: Secondary | ICD-10-CM | POA: Insufficient documentation

## 2024-01-24 DIAGNOSIS — D531 Other megaloblastic anemias, not elsewhere classified: Secondary | ICD-10-CM | POA: Insufficient documentation

## 2024-01-24 DIAGNOSIS — N939 Abnormal uterine and vaginal bleeding, unspecified: Secondary | ICD-10-CM | POA: Insufficient documentation

## 2024-01-24 DIAGNOSIS — D126 Benign neoplasm of colon, unspecified: Secondary | ICD-10-CM | POA: Insufficient documentation

## 2024-01-24 NOTE — Telephone Encounter (Signed)
 Pt called to cancel appt due to Bring discharged from ER this morning . PT blood Pressure is  220/146  Appt Rescheudled

## 2024-01-24 NOTE — Progress Notes (Deleted)
 [facility] [address] [place]. [place] [address] [phone]       OFFICE FOLLOW UP NOTE  [name] Date of Birth:  [birth year] Medical Record Number:  [id number]   Reason for visit: Stroke follow up    SUBJECTIVE:   CHIEF COMPLAINT:  No chief complaint on file.   HPI:   Update 01/24/2024 JM: Patient returns for 37-month follow-up regarding prior stroke and poststroke headaches.    Started on topiramate  25 mg twice daily at prior visit for poststroke headache Started on Nurtec but insurance required trial of Ubrelvy  first      History provided for reference purposes only Initial visit 06/27/2023 JM: Patient is being seen for initial hospital follow-up unaccompanied (family in lobby).  Reports continued right sided weakness and word finding difficulty although gradually improving.  Currently working with PT, was seen by SLP but waiting on updated orders to continue.  Ambulates with a cane outdoors, no recent falls.  She questions return back to driving.  She has been experiencing headaches since her stroke, frequent dull headaches but more severe headache about 1/week associated with photophobia and nausea.  Typically last 24 to 36 hours, will use Tylenol  if headache does not subside but typically does not provide much benefit. Has had some headaches prior to her stroke but only on rare occasions.  No new stroke/TIA symptoms.  Completed 3 weeks DAPT, denies current use of either aspirin  or clopidogrel  as she thought was only needed to take for 3-4 weeks after d/c, reports compliance on atorvastatin  without side effects Blood pressure has been elevated, reports compliance on amlodipine  and losartan  and use of clonidine as needed for BP > 160/100 which she needs to use daily. Has f/u with PCP in the next couple of weeks to discuss further treatment options.  Reports complete tobacco cessation since discharge.   Stroke admission 04/20/2023 [name]  is a 48 y.o. female with history of HTN, LBP, Crohn's disease, fibroid s/p hysterectomy, occasional cigar smoker who presented to ED on 04/20/2023 with difficulty walking and RLE weakness.  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  Further workup as noted below.  Recommended DAPT for 3 weeks and aspirin  alone.  LDL 130, initiated atorvastatin  40 mg daily.  Resume home HTN medications except for hydrochlorothiazide due to multiple electrolyte abnormalities and recommended close PCP follow-up.  Smoking cessation counseling provided.  Hypercoagulable labs unremarkable.  Therapies recommended outpatient for residual mild RLE weakness and decreased sensation.   PERTINENT IMAGING  CT no acute finding CTA head and neck Some irregularity of medium size intracranial branches on thick MIPS could be a real finding or technical artifact. I favor technical artifact but she does have several uncontrolled risk factor for premature atherosclerosis MRI  L CR small infarct MRI C-spine - unremarkable 2D Echo EF > 75% LDL 130 HgbA1c 6.1 UDS benzo Hypercoagulable labs unremarkable    ROS:   14 system review of systems performed and negative with exception of those listed in HPI  PMH:  Past Medical History:  Diagnosis Date   Anemia    allergy induced   Anxiety    Arthritis    Asthma with allergic rhinitis    Chronic back pain    Crohn's disease in remission (HCC)    Depression    Eczema    GERD (gastroesophageal reflux disease)    Headache    Heart murmur    asymptomatic  History of abnormal cervical Pap smear    History of kidney stones    Hypertension    Irritable bowel    Pinched nerve in neck    Uterine fibroid     PSH:  Past Surgical History:  Procedure Laterality Date   ABDOMINAL HYSTERECTOMY     BUNIONECTOMY Left    CESAREAN SECTION  08/31/2011   Procedure: CESAREAN SECTION;  Surgeon: Marjorie DEL. Okey, MD;  Location: [facility];  Service:  Gynecology;  Laterality: N/A;   COLONOSCOPY  last one 2015   CYSTOSCOPY  11/06/2018   Procedure: CYSTOSCOPY;  Surgeon: Sarrah Browning, MD;  Location[facility];  Service: Gynecology;;   DILATATION & CURETTAGE/HYSTEROSCOPY WITH MYOSURE N/A 01/13/2015   Procedure: DILATATION & CURETTAGE/HYSTEROSCOPY WITH  MYOSURE;  Surgeon: Marjorie Okey, MD;  Location: [facility];  Service: Gynecology;  Laterality: N/A;   ganglion cyst removed Right    hiatal hernia repair  as child   LEEP  1990's   MENISCUS REPAIR Right 6 yrs ago   MYOMECTOMY ABDOMINAL APPROACH  02/15/2005   NORPLANT  REMOVAL Left 11/06/2018   Procedure: REMOVAL OF NEXPLANON;  Surgeon: Sarrah Browning, MD;  Location: [facility];  Service: Gynecology;  Laterality: Left;   ROBOT ASSISTED MYOMECTOMY  06/2010   ROBOTIC ASSISTED LAPAROSCOPIC HYSTERECTOMY AND SALPINGECTOMY Bilateral 11/06/2018   Procedure: XI ROBOTIC ASSISTED LAPAROSCOPIC HYSTERECTOMY WITH BILATERAL SALPINGECTOMY;  Surgeon: Sarrah Browning, MD;  Location: [facility];  Service: Gynecology;  Laterality: Bilateral;   SHOULDER SURGERY      Social History:  Social History   Socioeconomic History   Marital status: Single    Spouse name: Not on file   Number of children: Not on file   Years of education: Not on file   Highest education level: Not on file  Occupational History   Not on file  Tobacco Use   Smoking status: Never    Passive exposure: Yes   Smokeless tobacco: Never  Vaping Use   Vaping status: Never Used  Substance and Sexual Activity   Alcohol use: Yes    Comment: occasional   Drug use: No   Sexual activity: Yes    Birth control/protection: Pill, Implant    Comment: to be removed during surgery  Other Topics Concern   Not on file  Social History Narrative   Not on file   Social Drivers of Health   Financial Resource Strain: Medium Risk (06/18/2023)   Overall Financial Resource Strain  (CARDIA)    Difficulty of Paying Living Expenses: Somewhat hard  Food Insecurity: Low Risk  (08/08/2023)   Received from [facility]   Hunger Vital Sign    Within the past 12 months, you worried that your food would run out before you got money to buy more: Never true    Within the past 12 months, the food you bought just didn't last and you didn't have money to get more. : Never true  Transportation Needs: No Transportation Needs (08/08/2023)   Received from [facility]    In the past 12 months, has lack of reliable transportation kept you from medical appointments, meetings, work or from getting things needed for daily living? : No  Physical Activity: Not on file  Stress: Stress Concern Present (05/08/2023)   [employer] of Occupational Health - Occupational Stress Questionnaire    Feeling of Stress : Rather much  Social Connections: Unknown (08/17/2021)   Received from [facility]  Social Network    Social Network: Not on file  Intimate Partner Violence: Not At Risk (07/31/2023)   Humiliation, Afraid, Rape, and Kick questionnaire    Fear of Current or Ex-Partner: No    Emotionally Abused: No    Physically Abused: No    Sexually Abused: No    Family History:  Family History  Problem Relation Age of Onset   Hypertension Mother    Asthma Mother    Bronchitis Mother    Heart disease Mother    Kidney failure Father        on HD temporary   Kidney disease Father    Asthma Father    Parkinson's disease Father    Eczema Sister    Sinusitis Sister    Asthma Maternal Grandmother    Eczema Maternal Grandmother    Bronchitis Maternal Grandmother    Allergic rhinitis Son    Eczema Son    Urticaria Neg Hx    Immunodeficiency Neg Hx    Angioedema Neg Hx     Medications:   Current Outpatient Medications on File Prior to Visit  Medication Sig Dispense Refill   albuterol  (VENTOLIN  HFA) 108 (90 Base) MCG/ACT inhaler INHALE 2 PUFFS INTO THE LUNGS EVERY 4  HOURS AS NEEDED FOR WHEEING OR SHORTNESS OF BREATH 18 g 1   amLODipine  (NORVASC ) 10 MG tablet Take 10 mg by mouth daily.     aspirin  EC 81 MG tablet Take 1 tablet (81 mg total) by mouth daily. Swallow whole.     atorvastatin  (LIPITOR) 40 MG tablet Take 1 tablet (40 mg total) by mouth daily. 30 tablet 0   azelastine  (ASTELIN ) 0.1 % nasal spray Place 1-2 sprays into both nostrils 2 (two) times daily as needed. 30 mL 5   Blood Pressure Monitoring (H-E-B INCONTROL BP MONITOR) KIT 1 Device by Does not apply route 3 (three) times daily. 1 kit 0   budesonide -formoterol  (SYMBICORT ) 160-4.5 MCG/ACT inhaler Take 2 puffs 3-7 days a week for control of asthma symptoms 1 each 5   cloNIDine (CATAPRES) 0.1 MG tablet Take 0.1 mg by mouth daily as needed (if B/P is greater than 160/100).     dupilumab  (DUPIXENT ) 300 MG/2ML prefilled syringe Inject 300 mg into the skin every 14 (fourteen) days. Then 300mg  every 14 days 4 mL 11   escitalopram  (LEXAPRO ) 10 MG tablet Take 10 mg by mouth daily.     famotidine  (PEPCID ) 20 MG tablet Take 1 tablet (20 mg total) by mouth daily. (Patient taking differently: Take 20 mg by mouth in the morning.) 30 tablet 3   Ferrous Sulfate  (IRON HIGH-POTENCY) 325 MG TABS Take 325 mg by mouth daily with breakfast.     guaiFENesin  (MUCINEX ) 600 MG 12 hr tablet Take 1 tablet (600 mg total) by mouth 2 (two) times daily. 60 tablet 0   hydrocortisone  2.5 % ointment Apply topically twice daily as need to red sandpapery rash. (Patient taking differently: Apply 1 Application topically 2 (two) times daily as needed (to red, sandpaper-y rash).) 30 g 0   levocetirizine (XYZAL ) 5 MG tablet Take up to 2 tablets twice daily as needed for hives and swelling 120 tablet 5   losartan  (COZAAR ) 50 MG tablet Take 1 tablet (50 mg total) by mouth daily. 90 tablet 0   mesalamine  (LIALDA ) 1.2 g EC tablet Take 2.4 g by mouth daily with breakfast.     metoprolol  succinate (TOPROL -XL) 100 MG 24 hr tablet Take 100 mg by  mouth daily.  Take with or immediately following a meal.     montelukast  (SINGULAIR ) 10 MG tablet Take 1 tablet (10 mg total) by mouth at bedtime. 90 tablet 1   pantoprazole  (PROTONIX ) 40 MG tablet Take 40 mg by mouth 2 (two) times daily before a meal.     potassium chloride  SA (KLOR-CON  M) 20 MEQ tablet Take 1 tablet (20 mEq total) by mouth daily for 7 days.     tacrolimus  (PROTOPIC ) 0.1 % ointment Use topically twice daily as needed for red, sandpapery rash (Patient taking differently: Apply 1 Application topically See admin instructions. Use topically twice daily as needed for red, sandpaper-y rash) 100 g 3   topiramate  (TOPAMAX ) 25 MG tablet Take 1 tablet (25 mg total) by mouth 2 (two) times daily. 60 tablet 11   triamcinolone  cream (KENALOG ) 0.1 % Apply 1 Application topically 2 (two) times daily as needed (itching). Do not use on face, groin or arm pits. 453 g 3   Ubrogepant  (UBRELVY ) 100 MG TABS Take 1 tablet (100 mg total) by mouth as needed (Take 1 tab at onset of migraine,may repeat an additional tablet 2 hrs if needed (Do NOT exceed more than 2 tabl). 10 tablet 5   Vitamin D , Ergocalciferol , (DRISDOL ) 1.25 MG (50000 UNIT) CAPS capsule Take 50,000 Units by mouth every 7 (seven) days.     No current facility-administered medications on file prior to visit.    Allergies:   Allergies  Allergen Reactions   Aciphex [Rabeprazole Sodium] Hives   Lisinopril Swelling and Other (See Comments)    Facial swelling   Iodinated Contrast Media Hives, Itching and Other (See Comments)    Contrast dye      OBJECTIVE:  Physical Exam  There were no vitals filed for this visit.  There is no height or weight on file to calculate BMI. No results found.   General: well developed, well nourished, very pleasant middle-age African-American female, seated, in no evident distress  Neurologic Exam Mental Status: Awake and fully alert.  Mild aphasia, unable to appreciate dysarthria.  Oriented to  place and time. Recent and remote memory intact. Attention span, concentration and fund of knowledge appropriate. Mood and affect appropriate.  Cranial Nerves: Fundoscopic exam reveals sharp disc margins. Pupils equal, briskly reactive to light. Extraocular movements full without nystagmus. Visual fields full to confrontation. Hearing intact. Facial sensation intact. Face, tongue, palate moves normally and symmetrically.  Motor: Normal strength, bulk and tone left upper and lower extremity.  RUE 4/5 greater distally, RLE 4-4+/5 throughout Sensory.: intact to touch , pinprick , position and vibratory sensation although odd sensation with light touch over right upper and lower extremity Coordination: Rapid alternating movements mildly decreased right hand and right foot. Finger-to-nose and heel-to-shin mild right-sided ataxia. Gait and Station: Arises from chair without difficulty. Stance is normal. Gait demonstrates decreased stride length and step height of RLE and mild imbalance with use of cane Reflexes: 1+ and symmetric. Toes downgoing.         ASSESSMENT: Colleen Baker is a 48 y.o. year old female with left CR small infarct on 04/20/2023 secondary to small vessel disease source. Vascular risk factors include HTN, HLD, pre-DM and tobacco use.      PLAN:  Ischemic stroke:  Residual deficit: Mild right hemiparesis with ataxia and aphasia.  Continue working with PT and restart SLP as advised.  Discussed typical recovery time.  Would not recommend returning back to driving at this time due to right sided ataxia and  encouraged continued participation with PT, she was provided driving rehab/evaluation locations if interested in pursuing Continue aspirin  81mg  daily and atorvastatin  (Lipitor) for secondary stroke prevention managed/prescribed by PCP.   Advised to continue to monitor blood pressure at home and follow-up with PCP for further treatment options, discussed risk of untreated HTN Discussed  secondary stroke prevention measures and importance of close PCP follow up for aggressive stroke risk factor management including BP goal<130/90, HLD with LDL goal<70 and pre-DM with A1c.<7 .  Stroke labs 11/2023: LDL 78, A1c 5.8 I have gone over the pathophysiology of stroke, warning signs and symptoms, risk factors and their management in some detail with instructions to go to the closest emergency room for symptoms of concern.  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   Harlene Bogaert, AGNP-BC  [facility] [address] [place], [place] [address]  Phone [phone] Fax [phone] Note: This document was prepared with digital dictation and possible smart phrase technology. Any transcriptional errors that result from this process are unintentional.

## 2024-01-25 ENCOUNTER — Other Ambulatory Visit: Payer: Self-pay | Admitting: Pharmacy Technician

## 2024-01-25 ENCOUNTER — Other Ambulatory Visit: Payer: Self-pay

## 2024-01-25 NOTE — Progress Notes (Signed)
 Specialty Pharmacy Refill Coordination Note  Colleen Baker is a 48 y.o. female contacted today regarding refills of specialty medication(s) Dupilumab  (Dupixent )   Patient requested Delivery   Delivery date: 01/30/24   Verified address: [address]  [place]   Medication will be filled on 01/29/24.

## 2024-01-29 ENCOUNTER — Other Ambulatory Visit (HOSPITAL_COMMUNITY): Payer: Self-pay

## 2024-02-20 ENCOUNTER — Ambulatory Visit: Admitting: Internal Medicine

## 2024-02-20 ENCOUNTER — Encounter: Payer: Self-pay | Admitting: Internal Medicine

## 2024-02-20 ENCOUNTER — Other Ambulatory Visit: Payer: Self-pay

## 2024-02-20 VITALS — BP 110/76 | HR 85 | Temp 98.1°F | Resp 18 | Wt 179.8 lb

## 2024-02-20 DIAGNOSIS — H1013 Acute atopic conjunctivitis, bilateral: Secondary | ICD-10-CM | POA: Diagnosis not present

## 2024-02-20 DIAGNOSIS — H1010 Acute atopic conjunctivitis, unspecified eye: Secondary | ICD-10-CM

## 2024-02-20 DIAGNOSIS — J3089 Other allergic rhinitis: Secondary | ICD-10-CM

## 2024-02-20 DIAGNOSIS — J4541 Moderate persistent asthma with (acute) exacerbation: Secondary | ICD-10-CM | POA: Diagnosis not present

## 2024-02-20 DIAGNOSIS — L308 Other specified dermatitis: Secondary | ICD-10-CM

## 2024-02-20 DIAGNOSIS — K219 Gastro-esophageal reflux disease without esophagitis: Secondary | ICD-10-CM | POA: Diagnosis not present

## 2024-02-20 DIAGNOSIS — J302 Other seasonal allergic rhinitis: Secondary | ICD-10-CM

## 2024-02-20 DIAGNOSIS — L508 Other urticaria: Secondary | ICD-10-CM

## 2024-02-20 MED ORDER — METHYLPREDNISOLONE ACETATE 40 MG/ML IJ SUSP
40.0000 mg | Freq: Once | INTRAMUSCULAR | Status: AC
  Administered 2024-02-20: 40 mg via INTRAMUSCULAR

## 2024-02-20 MED ORDER — BUDESONIDE-FORMOTEROL FUMARATE 160-4.5 MCG/ACT IN AERO: INHALATION_SPRAY | RESPIRATORY_TRACT | 5 refills | Status: AC

## 2024-02-20 MED ORDER — PREDNISONE 20 MG PO TABS: 40.0000 mg | ORAL_TABLET | Freq: Every day | ORAL | 0 refills | Status: AC

## 2024-02-20 MED ORDER — ALBUTEROL SULFATE (2.5 MG/3ML) 0.083% IN NEBU: 2.5000 mg | INHALATION_SOLUTION | Freq: Four times a day (QID) | RESPIRATORY_TRACT | 12 refills | Status: AC | PRN

## 2024-02-20 MED ORDER — ALBUTEROL SULFATE HFA 108 (90 BASE) MCG/ACT IN AERS: INHALATION_SPRAY | RESPIRATORY_TRACT | 1 refills | Status: AC
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
## 2024-02-20 NOTE — Patient Instructions (Addendum)
 Asthma with exacerbation Spirometry today shows obstruction despite having recent albuterol  treatment at home 40 mg IM depo given in clinic today, tomorrow start prednisone  40 mg daily x 4 days Use Symbicort  160 mcg 2 puffs twice daily for 1-2 weeks or until symptoms resolve and then go back to using as before Continue with montelukast  10 mg once a day to prevent cough or wheeze Symbicort  160-2 puffs 3-7 days a week with a spacer to prevent cough or wheeze. Daily Symbicort  use will provide better control of your asthma Continue albuterol  2 puffs or 1 vial via nebulizer once every 4 hours if needed for cough or wheeze You may use albuterol  2 puffs 5 to 15 minutes before activity to decrease cough or wheeze Continue Dupixent  300 mg once every 2 weeks for control of asthma symptoms   Allergic rhinitis Continue allergen avoidance measures directed toward indoor and outdoor molds as well as dust mites as listed below Continue Flonase  2 sprays in each nostril once a day as needed for stuffy nose.  In the right nostril, point the applicator out toward the right ear. In the left nostril, point the applicator out toward the left ear Continue montelukast  as listed above for allergy symptom control Continue Xyzal  5 mg once a day as needed for runny nose or itch Continue azelastine  2 sprays in each nostril up to twice a day as needed for runny nose Consider saline nasal rinses as needed for nasal symptoms. Use this before any medicated nasal sprays for best result Consider allergen immunotherapy if your symptoms are not well-controlled with the treatment plan as listed above  Allergic conjunctivitis Some over the counter eye drops include Pataday one drop in each eye once a day as needed for red, itchy eyes OR Zaditor one drop in each eye twice a day as needed for red itchy eyes. Avoid eye drops that say red eye relief as they may contain medications that dry out your eyes.   Atopic Dermatitis:  Daily  Care For Maintenance (daily and continue even once eczema controlled) - Use hypoallergenic hydrating ointment at least twice daily.  This must be done daily for control of flares. (Great options include Vaseline, CeraVe, Aquaphor, Aveeno, Cetaphil, VaniCream, etc) - Avoid detergents, soaps or lotions with fragrances/dyes - Limit showers/baths to 5 minutes and use luke warm water  instead of hot, pat dry following baths, and apply moisturizer - can use steroid/non-steroid therapy creams as detailed below up to twice weekly for prevention of flares.   For Flares:(add this to maintenance therapy if needed for flares) First apply steroid/non-steroid treatment creams. Wait 5 minutes then apply moisturizer.  - Triamcinolone  0.1% to body for moderate flares-apply topically twice daily to red, raised areas of skin, followed by moisturizer. Do NOT use on face, groin or armpits. - Hydrocortisone  2.5% to face/body-apply topically twice daily to red, raised areas of skin, followed by moisturizer - Non-steroid treatment options:   tacrolimus   apply topically twice daily as needed (can use in place of steroid creams if desires) - Continue Dupixent  injections for control of atopic dermatitis  Reflux Continue dietary and lifestyle modifications Follow-up with your GI specialist as recommended  Urticaria (Hives) Take the lease amount of medications while remaining hive free Levocetirizine (Xyzal ) 5 mg twice a day and famotidine  (Pepcid ) 20 mg twice a day. If no symptoms for 7-14 days then decrease to. Levocetirizine (Xyzal ) 5 mg twice a day and famotidine  (Pepcid ) 20 mg once a day.  If no symptoms  for 7-14 days then decrease to. Levocetirizine (Xyzal ) 5 mg twice a day.  If no symptoms for 7-14 days then decrease to. Levocetirizine (Xyzal ) 5 mg once a day.   Hx of Stroke Heart Murmur Present BP wnl at todays' visit - continue follow-up with Cardiology as planned, ECHO normal 04/2023  Call the clinic if  this treatment plan is not working well for you.  Follow up in 6 months or sooner if needed.  Control of Mold Allergen Mold and fungi can grow on a variety of surfaces provided certain temperature and moisture conditions exist.  Outdoor molds grow on plants, decaying vegetation and soil.  The major outdoor mold, Alternaria and Cladosporium, are found in very high numbers during hot and dry conditions.  Generally, a late Summer - Fall peak is seen for common outdoor fungal spores.  Rain will temporarily lower outdoor mold spore count, but counts rise rapidly when the rainy period ends.  The most important indoor molds are Aspergillus and Penicillium.  Dark, humid and poorly ventilated basements are ideal sites for mold growth.  The next most common sites of mold growth are the bathroom and the kitchen.  Outdoor [employer] Use air conditioning and keep windows closed Avoid exposure to decaying vegetation. Avoid leaf raking. Avoid grain handling. Consider wearing a face mask if working in moldy areas.  Indoor Mold Control Maintain humidity below 50%. Clean washable surfaces with 5% bleach solution. Remove sources e.g. Contaminated carpets.   Control of Dust Mite Allergen Dust mites play a major role in allergic asthma and rhinitis. They occur in environments with high humidity wherever human skin is found. Dust mites absorb humidity from the atmosphere (ie, they do not drink) and feed on organic matter (including shed human and animal skin). Dust mites are a microscopic type of insect that you cannot see with the naked eye. High levels of dust mites have been detected from mattresses, pillows, carpets, upholstered furniture, bed covers, clothes, soft toys and any woven material. The principal allergen of the dust mite is found in its feces. A gram of dust may contain 1,000 mites and 250,000 fecal particles. Mite antigen is easily measured in the air during house cleaning activities. Dust mites do  not bite and do not cause harm to humans, other than by triggering allergies/asthma.  Ways to decrease your exposure to dust mites in your home:  1. Encase mattresses, box springs and pillows with a mite-impermeable barrier or cover  2. Wash sheets, blankets and drapes weekly in hot water  (130 F) with detergent and dry them in a dryer on the hot setting.  3. Have the room cleaned frequently with a vacuum cleaner and a damp dust-mop. For carpeting or rugs, vacuuming with a vacuum cleaner equipped with a high-efficiency particulate air (HEPA) filter. The dust mite allergic individual should not be in a room which is being cleaned and should wait 1 hour after cleaning before going into the room.  4. Do not sleep on upholstered furniture (eg, couches).  5. If possible removing carpeting, upholstered furniture and drapery from the home is ideal. Horizontal blinds should be eliminated in the rooms where the person spends the most time (bedroom, study, television room). Washable vinyl, roller-type shades are optimal.  6. Remove all non-washable stuffed toys from the bedroom. Wash stuffed toys weekly like sheets and blankets above.  7. Reduce indoor humidity to less than 50%. Inexpensive humidity monitors can be purchased at most hardware stores. Do not use a  humidifier as can make the problem worse and are not recommended.

## 2024-02-21 ENCOUNTER — Telehealth: Payer: Self-pay | Admitting: Adult Health

## 2024-02-21 ENCOUNTER — Other Ambulatory Visit: Payer: Self-pay

## 2024-02-21 NOTE — Progress Notes (Signed)
 Specialty Pharmacy Refill Coordination Note  Colleen Baker is a 48 y.o. female contacted today regarding refills of specialty medication(s) Dupilumab  (Dupixent )   Patient requested Delivery   Delivery date: 03/06/24   Verified address: [address]  [place]   Medication will be filled on: 03/05/24

## 2024-02-21 NOTE — Progress Notes (Signed)
 Specialty Pharmacy Ongoing Clinical Assessment Note  Colleen Baker is a 48 y.o. female who is being followed by the specialty pharmacy service for RxSp Atopic Dermatitis   Patient's specialty medication(s) reviewed today: Dupilumab  (Dupixent )   Missed doses in the last 4 weeks: 0   Patient/Caregiver did not have any additional questions or concerns.   Therapeutic benefit summary: Patient is achieving benefit   Adverse events/side effects summary: No adverse events/side effects   Patient's therapy is appropriate to: Continue    Goals Addressed             This Visit's Progress    Minimize recurrence of flares   On track    Patient is on track. Patient will maintain adherence         Follow up: 12 months  Colleen Baker M Mallerie Blok Specialty Pharmacist

## 2024-02-21 NOTE — Telephone Encounter (Signed)
 pt made an Inquiry about an earlier appointment, appointment moved up and pt is still on wait list

## 2024-02-28 NOTE — Progress Notes (Unsigned)
 [facility] [address] Colleen Baker. Colleen Baker [id number] [phone]       STROKE FOLLOW UP NOTE  [name] Date of Birth:  Sep 02, 1975 Medical Record Number:  [id number]   Reason for visit: stroke follow up    SUBJECTIVE:   CHIEF COMPLAINT:  Chief Complaint  Patient presents with   Follow-up    F/u stroke Pt stated--tingling feet/finger/arm and off balance.  Rm8, alone    HPI:   Update 02/29/2024 JM: Patient returns for stroke follow up visit.  Reports improvement of right sided weakness and gait since prior visit.  She no longer requires a cane for ambulation.  She denies any recent falls.  She continues working with PT. She does mention L>R upper and lower extremity numbness/tingling. Initially reported present over the past 6 weeks but she called back in April reporting this. Unfortunately, this information was not relayed and was advised by RN to have PT evaluate this further. Patient believes she has just tried to push through these symptoms and not pay attention to them but has been more bothersome/noticeable over the past 6 weeks. She was evaluated at [facility] ED in [facility] on 10/8 for hypertensive emergency as well as headache and dizziness.  MRI brain completed which did not show any acute abnormalities but did note remote infarcts in the left CR, anterior limb of left external capsule as well as right basal ganglia which was not mentioned on prior imaging. She reports continued difficulty managing blood pressure, she is currently being followed by cardiology on multiple different blood pressure medications.  She reports consistent almost daily headaches which are typically present upon awakening.  She is currently on Qulipta per PCP and is also continued on topiramate  which was started at prior visit. Will use Ubrelvy  for more severe headaches with benefit. Does note chronic difficulty sleeping at night as well as snoring, has not previously underwent  sleep evaluation.  She is not currently on aspirin  for no specific reason, she does report compliance on atorvastatin . Prior labs with PCP back in August which showed LDL 78 and A1c 5.8.       Initial visit 06/27/2023 JM: Patient is being seen for initial hospital follow-up unaccompanied (family in lobby).  Reports continued right sided weakness and word finding difficulty although gradually improving.  Currently working with PT, was seen by SLP but waiting on updated orders to continue.  Ambulates with a cane outdoors, no recent falls.  She questions return back to driving.  She has been experiencing headaches since her stroke, frequent dull headaches but more severe headache about 1/week associated with photophobia and nausea.  Typically last 24 to 36 hours, will use Tylenol  if headache does not subside but typically does not provide much benefit. Has had some headaches prior to her stroke but only on rare occasions.  No new stroke/TIA symptoms.  Completed 3 weeks DAPT, denies current use of either aspirin  or clopidogrel  as she thought was only needed to take for 3-4 weeks after d/c, reports compliance on atorvastatin  without side effects Blood pressure has been elevated, reports compliance on amlodipine  and losartan  and use of clonidine as needed for BP > 160/100 which she needs to use daily. Has f/u with PCP in the next couple of weeks to discuss further treatment options.  Reports complete tobacco cessation since discharge.    Stroke admission 04/20/2023 [name] is a 48 y.o. female with history of HTN, LBP, Crohn's disease, fibroid s/p hysterectomy, occasional  cigar smoker who presented to ED on 04/20/2023 with difficulty walking and RLE weakness.  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  Further workup as noted below.  Recommended DAPT for 3 weeks and aspirin  alone.  LDL 130, initiated atorvastatin  40 mg daily.  Resume home HTN medications except for  hydrochlorothiazide due to multiple electrolyte abnormalities and recommended close PCP follow-up.  Smoking cessation counseling provided.  Hypercoagulable labs unremarkable.  Therapies recommended outpatient for residual mild RLE weakness and decreased sensation.   PERTINENT IMAGING  CT no acute finding CTA head and neck Some irregularity of medium size intracranial branches on thick MIPS could be a real finding or technical artifact. I favor technical artifact but she does have several uncontrolled risk factor for premature atherosclerosis MRI  L CR small infarct MRI C-spine - unremarkable 2D Echo EF > 75% LDL 130 HgbA1c 6.1 UDS benzo Hypercoagulable labs unremarkable    ROS:   14 system review of systems performed and negative with exception of those listed in HPI  PMH:  Past Medical History:  Diagnosis Date   Anemia    allergy induced   Anxiety    Arthritis    Asthma with allergic rhinitis    Chronic back pain    Crohn's disease in remission (HCC)    Depression    Eczema    GERD (gastroesophageal reflux disease)    Headache    Heart murmur    asymptomatic    History of abnormal cervical Pap smear    History of kidney stones    Hypertension    Irritable bowel    Pinched nerve in neck    Uterine fibroid     PSH:  Past Surgical History:  Procedure Laterality Date   ABDOMINAL HYSTERECTOMY     BUNIONECTOMY Left    CESAREAN SECTION  08/31/2011   Procedure: CESAREAN SECTION;  Surgeon: Colleen DEL. Okey, MD;  Location: [facility];  Service: Gynecology;  Laterality: N/A;   COLONOSCOPY  last one 2015   CYSTOSCOPY  11/06/2018   Procedure: CYSTOSCOPY;  Surgeon: Colleen Browning, MD;  Location: [facility];  Service: Gynecology;;   DILATATION & CURETTAGE/HYSTEROSCOPY WITH MYOSURE N/A 01/13/2015   Procedure: DILATATION & CURETTAGE/HYSTEROSCOPY WITH  MYOSURE;  Surgeon: Colleen Okey, MD;  Location: [facility];  Service: Gynecology;  Laterality: N/A;    ganglion cyst removed Right    hiatal hernia repair  as child   LEEP  1990's   MENISCUS REPAIR Right 6 yrs ago   MYOMECTOMY ABDOMINAL APPROACH  02/15/2005   NORPLANT  REMOVAL Left 11/06/2018   Procedure: REMOVAL OF NEXPLANON;  Surgeon: Colleen Browning, MD;  Location: [facility];  Service: Gynecology;  Laterality: Left;   ROBOT ASSISTED MYOMECTOMY  06/2010   ROBOTIC ASSISTED LAPAROSCOPIC HYSTERECTOMY AND SALPINGECTOMY Bilateral 11/06/2018   Procedure: XI ROBOTIC ASSISTED LAPAROSCOPIC HYSTERECTOMY WITH BILATERAL SALPINGECTOMY;  Surgeon: Colleen Browning, MD;  Location: [facility];  Service: Gynecology;  Laterality: Bilateral;   SHOULDER SURGERY      Social History:  Social History   Socioeconomic History   Marital status: Single    Spouse name: Not on file   Number of children: Not on file   Years of education: Not on file   Highest education level: Not on file  Occupational History   Not on file  Tobacco Use   Smoking status: Never    Passive exposure: Yes   Smokeless tobacco: Never  Vaping Use   Vaping status: Never Used  Substance and Sexual Activity   Alcohol use: Yes    Comment: occasional   Drug use: No   Sexual activity: Yes    Birth control/protection: Pill, Implant    Comment: to be removed during surgery  Other Topics Concern   Not on file  Social History Narrative   Not on file   Social Drivers of Health   Financial Resource Strain: Medium Risk (06/18/2023)   Overall Financial Resource Strain (CARDIA)    Difficulty of Paying Living Expenses: Somewhat hard  Food Insecurity: Low Risk  (08/08/2023)   Received from [facility]   Hunger Vital Sign    Within the past 12 months, you worried that your food would run out before you got money to buy more: Never true    Within the past 12 months, the food you bought just didn't last and you didn't have money to get more. : Never true  Transportation Needs: No Transportation Needs  (08/08/2023)   Received from [facility]    In the past 12 months, has lack of reliable transportation kept you from medical appointments, meetings, work or from getting things needed for daily living? : No  Physical Activity: Not on file  Stress: Stress Concern Present (05/08/2023)   [employer] of Occupational [employer] - Occupational Stress Questionnaire    Feeling of Stress : Rather much  Social Connections: Not on file  Intimate Partner Violence: Not At Risk (07/31/2023)   Humiliation, Afraid, Rape, and Kick questionnaire    Fear of Current or Ex-Partner: No    Emotionally Abused: No    Physically Abused: No    Sexually Abused: No    Family History:  Family History  Problem Relation Age of Onset   Hypertension Mother    Asthma Mother    Bronchitis Mother    Heart disease Mother    Kidney failure Father        on HD temporary   Kidney disease Father    Asthma Father    Parkinson's disease Father    Eczema Sister    Sinusitis Sister    Asthma Maternal Grandmother    Eczema Maternal Grandmother    Bronchitis Maternal Grandmother    Allergic rhinitis Son    Eczema Son    Urticaria Neg Hx    Immunodeficiency Neg Hx    Angioedema Neg Hx     Medications:   Current Outpatient Medications on File Prior to Visit  Medication Sig Dispense Refill   albuterol  (PROVENTIL ) (2.5 MG/3ML) 0.083% nebulizer solution Take 3 mLs (2.5 mg total) by nebulization every 6 (six) hours as needed for wheezing or shortness of breath. 75 mL 12   albuterol  (VENTOLIN  HFA) 108 (90 Base) MCG/ACT inhaler INHALE 2 PUFFS INTO THE LUNGS EVERY 4 HOURS AS NEEDED FOR WHEEING OR SHORTNESS OF BREATH 18 g 1   amLODipine  (NORVASC ) 10 MG tablet Take 10 mg by mouth daily.     aspirin  EC 81 MG tablet Take 1 tablet (81 mg total) by mouth daily. Swallow whole.     atorvastatin  (LIPITOR) 80 MG tablet Take 80 mg by mouth daily.     azelastine  (ASTELIN ) 0.1 % nasal spray Place 1-2 sprays into both  nostrils 2 (two) times daily as needed. 30 mL 5   BELSOMRA 15 MG TABS Take 1 tablet by mouth at bedtime.     Blood Pressure Monitoring (H-E-B INCONTROL BP MONITOR) KIT 1  Device by Does not apply route 3 (three) times daily. 1 kit 0   budesonide -formoterol  (SYMBICORT ) 160-4.5 MCG/ACT inhaler Take 2 puffs 3-7 days a week for control of asthma symptoms 1 each 5   carvedilol (COREG) 12.5 MG tablet Take 12.5 mg by mouth.     cloNIDine (CATAPRES) 0.1 MG tablet Take 0.1 mg by mouth daily as needed (if B/P is greater than 160/100).     DULoxetine  (CYMBALTA ) 60 MG capsule Take 60 mg by mouth daily.     dupilumab  (DUPIXENT ) 300 MG/2ML prefilled syringe Inject 300 mg into the skin every 14 (fourteen) days. Then 300mg  every 14 days 4 mL 11   escitalopram  (LEXAPRO ) 10 MG tablet Take 10 mg by mouth daily.     famotidine  (PEPCID ) 40 MG tablet Take 40 mg by mouth daily.     Ferrous Sulfate  (IRON HIGH-POTENCY) 325 MG TABS Take 325 mg by mouth daily with breakfast.     guaiFENesin  (MUCINEX ) 600 MG 12 hr tablet Take 1 tablet (600 mg total) by mouth 2 (two) times daily. 60 tablet 0   hydrALAZINE  (APRESOLINE ) 50 MG tablet Take 50 mg by mouth 2 (two) times daily.     hydrocortisone  2.5 % ointment Apply topically twice daily as need to red sandpapery rash. (Patient taking differently: Apply 1 Application topically 2 (two) times daily as needed (to red, sandpaper-y rash).) 30 g 0   levocetirizine (XYZAL ) 5 MG tablet Take up to 2 tablets twice daily as needed for hives and swelling 120 tablet 5   losartan  (COZAAR ) 100 MG tablet Take 100 mg by mouth daily.     mesalamine  (LIALDA ) 1.2 g EC tablet Take 2.4 g by mouth daily with breakfast.     metoprolol  succinate (TOPROL -XL) 100 MG 24 hr tablet Take 100 mg by mouth daily. Take with or immediately following a meal.     metoprolol  tartrate (LOPRESSOR ) 100 MG tablet Take 100 mg by mouth daily. with food     mirtazapine (REMERON) 15 MG tablet Take 15 mg by mouth.      montelukast  (SINGULAIR ) 10 MG tablet Take 1 tablet (10 mg total) by mouth at bedtime. 90 tablet 1   pantoprazole  (PROTONIX ) 40 MG tablet Take 40 mg by mouth 2 (two) times daily before a meal.     potassium chloride  (KLOR-CON  M) 10 MEQ tablet Take 10 mEq by mouth daily.     QULIPTA 60 MG TABS Take 1 tablet by mouth daily.     spironolactone (ALDACTONE) 25 MG tablet Take 12.5 mg by mouth daily.     tacrolimus  (PROTOPIC ) 0.1 % ointment Use topically twice daily as needed for red, sandpapery rash (Patient taking differently: Apply 1 Application topically See admin instructions. Use topically twice daily as needed for red, sandpaper-y rash) 100 g 3   topiramate  (TOPAMAX ) 25 MG tablet Take 1 tablet (25 mg total) by mouth 2 (two) times daily. 60 tablet 11   triamcinolone  cream (KENALOG ) 0.1 % Apply 1 Application topically 2 (two) times daily as needed (itching). Do not use on face, groin or arm pits. 453 g 3   Ubrogepant  (UBRELVY ) 100 MG TABS Take 1 tablet (100 mg total) by mouth as needed (Take 1 tab at onset of migraine,may repeat an additional tablet 2 hrs if needed (Do NOT exceed more than 2 tabl). 10 tablet 5   Vitamin D , Ergocalciferol , (DRISDOL ) 1.25 MG (50000 UNIT) CAPS capsule Take 50,000 Units by mouth every 7 (seven) days.  No current facility-administered medications on file prior to visit.    Allergies:   Allergies  Allergen Reactions   Aciphex [Rabeprazole Sodium] Hives   Lisinopril Swelling and Other (See Comments)    Facial swelling   Iodinated Contrast Media Hives, Itching and Other (See Comments)    Contrast dye      OBJECTIVE:  Physical Exam  Vitals:   02/29/24 0942  BP: (!) 165/94  Pulse: (!) 107  SpO2: 98%  Weight: 176 lb (79.8 kg)  Height: 5' 10 (1.778 m)   Body mass index is 25.25 kg/m. No results found.   General: well developed, well nourished, very pleasant middle-age African-American female, seated, in no evident distress Neck: Circumference  15  Neurologic Exam Mental Status: Awake and fully alert.  Mild aphasia but overall greatly improved, unable to appreciate dysarthria.  Oriented to place and time. Recent and remote memory intact. Attention span, concentration and fund of knowledge appropriate. Mood and affect appropriate.  Cranial Nerves: Pupils equal, briskly reactive to light. Extraocular movements full without nystagmus. Visual fields full to confrontation. Hearing intact. Facial sensation intact. Face, tongue, palate moves normally and symmetrically.  Motor:  LUE: 4/5 with decreased grip strength compared to right LLE: 4/5 KE, KF, ADF, APF RUE: 4+/5 RLE: 4+-5-/5 Sensory.: intact to touch , pinprick , position and vibratory sensation although L>R upper and lower extremity odd sensation Coordination: Rapid alternating movements mildly decreased on left side. Finger-to-nose and heel-to-shin mild left-sided ataxia. Prior abnormalities on right side which were not as prevalent today Gait and Station: Arises from chair without difficulty. Stance is normal. Gait demonstrates slow cautious steps although adequate stride length and step height bilaterally, mild imbalance, no use of AD Reflexes: 1+ and symmetric. Toes downgoing.         ASSESSMENT: Colleen Baker is a 48 y.o. year old female with left CR small infarct on 04/20/2023 secondary to small vessel disease source. Vascular risk factors include HTN, HLD, pre-DM and tobacco use. Onset of left sided numbness in 07/2023, MR brain 01/2024 showed chronic right BG infarct suspect causing left sided symptoms with evidence of mild left hemiparesis and ataxia on exam.      PLAN:  Ischemic stroke:  Residual deficit: left > right hemiparesis with ataxia and very slight aphasia, L>R numbness/tingling. Suspect left sided symptoms due to R BG stroke but will also check labs to rule out reversible causes of numbness/tingling.  Continue working with PT.  Advised to restart aspirin  81mg   daily and continue atorvastatin  (Lipitor) for secondary stroke prevention managed/prescribed by PCP.   Advised to continue to monitor blood pressure at home and continue close follow-up with cardiology for resistant hypertension Discussed secondary stroke prevention measures and importance of close PCP follow up for aggressive stroke risk factor management including BP goal<130/90, HLD with LDL goal<70 and pre-DM with A1c.<7 .  Stroke labs 11/2023: LDL 78, A1c 5.7 - will repeat today I have gone over the pathophysiology of stroke, warning signs and symptoms, risk factors and their management in some detail with instructions to go to the closest emergency room for symptoms of concern.  Mixed headaches: Persistent almost daily headaches, suspect multiple contributing factors Advised to stop topiramate  and start zonisamide 100 mg nightly.  Can consider dosage increase after 1 month if needed.  If no benefit or difficulty tolerating, would recommend initiating CGRP.  She is currently on multiple antihypertensives, she is on duloxetine , Lexapro  and mirtazapine Continue Qulipta per PCP Continue Ubrelvy  as needed  for more severe migraines   At risk for sleep apnea Referral placed to complete sleep study for further evaluation Morning headaches, persistent daily headaches, insomnia, snoring, resistant hypertension, recurrent strokes ESS 3/24, FSS 45/63       Follow up in 3 months or call earlier if needed   CC:  [facility] provider: Dr. Rosemarie PCP: Dorene Perkins, NP    I personally spent a total of 50 minutes in the care of the patient today including preparing to see the patient, getting/reviewing separately obtained history, performing a medically appropriate exam/evaluation, counseling and educating, placing orders, and documenting clinical information in the EHR.   Harlene Bogaert, AGNP-BC  [facility] [address] [place], [place] [address]  Phone  [phone] Fax [phone] Note: This document was prepared with digital dictation and possible smart phrase technology. Any transcriptional errors that result from this process are unintentional.

## 2024-02-29 ENCOUNTER — Telehealth: Payer: Self-pay | Admitting: Adult Health

## 2024-02-29 ENCOUNTER — Ambulatory Visit: Admitting: Adult Health

## 2024-02-29 ENCOUNTER — Encounter: Payer: Self-pay | Admitting: Adult Health

## 2024-02-29 VITALS — BP 165/94 | HR 107 | Ht 70.0 in | Wt 176.0 lb

## 2024-02-29 DIAGNOSIS — R519 Headache, unspecified: Secondary | ICD-10-CM

## 2024-02-29 DIAGNOSIS — E785 Hyperlipidemia, unspecified: Secondary | ICD-10-CM

## 2024-02-29 DIAGNOSIS — E538 Deficiency of other specified B group vitamins: Secondary | ICD-10-CM

## 2024-02-29 DIAGNOSIS — R7303 Prediabetes: Secondary | ICD-10-CM

## 2024-02-29 DIAGNOSIS — I639 Cerebral infarction, unspecified: Secondary | ICD-10-CM

## 2024-02-29 DIAGNOSIS — R29818 Other symptoms and signs involving the nervous system: Secondary | ICD-10-CM | POA: Diagnosis not present

## 2024-02-29 DIAGNOSIS — R202 Paresthesia of skin: Secondary | ICD-10-CM

## 2024-02-29 DIAGNOSIS — R400 Somnolence: Secondary | ICD-10-CM

## 2024-02-29 DIAGNOSIS — G4700 Insomnia, unspecified: Secondary | ICD-10-CM

## 2024-02-29 DIAGNOSIS — R200 Anesthesia of skin: Secondary | ICD-10-CM

## 2024-02-29 MED ORDER — ZONISAMIDE 100 MG PO CAPS: 100.0000 mg | ORAL_CAPSULE | Freq: Every day | ORAL | 5 refills | Status: AC

## 2024-02-29 NOTE — Patient Instructions (Signed)
 Your recent imaging during admission for high blood pressure did show a chronic stroke in the right basal ganglia which was not mentioned on your prior imaging in January.  I do suspect this is likely causing your left-sided symptoms but we will also check lab work today to evaluate for reversible causes of your numbness/tingling.  Please discontinue topiramate  as this may be contributing to numbness/tingling.  Will start on zonisamide 100 mg nightly for further headache prevention.  If no benefit over the next 1 month, please let me know and we can consider increasing dosage.  If unable to tolerate, can consider monthly injectable medications.  Continue to follow closely with cardiology for blood pressure management as this may be contributing to your headaches and also increased risk of recurrent strokes  Continue Ubrelvy  as needed for more severe migraines, can repeat after 2 hours if needed.   Recommend pursuing sleep evaluation as underlying sleep apnea may possibly be contributing to persistent headaches and difficult to control blood pressure.  Untreated sleep apnea also increases your risk of recurrent strokes and heart disease.  Restart aspirin  81 mg daily  and continue atorvastatin   for secondary stroke prevention, will repeat cholesterol levels and A1c today  Continue to follow up with PCP regarding blood pressure and cholesterol management  Maintain strict control of hypertension with blood pressure goal below 130/90, pre-diabetes with hemoglobin A1c goal below 7.0 % and cholesterol with LDL cholesterol (bad cholesterol) goal below 70 mg/dL.   Signs of a Stroke? Follow the BEFAST method:  Balance Watch for a sudden loss of balance, trouble with coordination or vertigo Eyes Is there a sudden loss of vision in one or both eyes? Or double vision?  Face: Ask the person to smile. Does one side of the face droop or is it numb?  Arms: Ask the person to raise both arms. Does one arm drift  downward? Is there weakness or numbness of a leg? Speech: Ask the person to repeat a simple phrase. Does the speech sound slurred/strange? Is the person confused ? Time: If you observe any of these signs, call 911.      Followup in the future with me in 3 months or call earlier if needed       Thank you for coming to see us  at [facility]. I hope we have been able to provide you high quality care today.  You may receive a patient satisfaction survey over the next few weeks. We would appreciate your feedback and comments so that we may continue to improve ourselves and the health of our patients.

## 2024-02-29 NOTE — Telephone Encounter (Signed)
 Pt has called to report that her transportation informed her they will not arrive here before 9:39, this was relayed to Harlene, NP who said it is fine for pt to still come to be seen

## 2024-03-01 LAB — HEMOGLOBIN A1C
Est. average glucose Bld gHb Est-mCnc: 117 mg/dL
Hgb A1c MFr Bld: 5.7 % — ABNORMAL HIGH (ref 4.8–5.6)

## 2024-03-01 LAB — LIPID PANEL
Chol/HDL Ratio: 3.6 ratio (ref 0.0–4.4)
Cholesterol, Total: 169 mg/dL (ref 100–199)
HDL: 47 mg/dL (ref >39)
LDL Chol Calc (NIH): 92 mg/dL (ref 0–99)
Triglycerides: 177 mg/dL — ABNORMAL HIGH (ref 0–149)
VLDL Cholesterol Cal: 30 mg/dL (ref 5–40)

## 2024-03-01 LAB — VITAMIN B12: Vitamin B-12: 323 pg/mL (ref 232–1245)

## 2024-03-01 LAB — TSH: TSH: 2.74 u[IU]/mL (ref 0.450–4.500)

## 2024-03-03 ENCOUNTER — Ambulatory Visit: Payer: Self-pay | Admitting: Adult Health

## 2024-03-03 DIAGNOSIS — E538 Deficiency of other specified B group vitamins: Secondary | ICD-10-CM

## 2024-03-03 DIAGNOSIS — E785 Hyperlipidemia, unspecified: Secondary | ICD-10-CM

## 2024-03-03 MED ORDER — ROSUVASTATIN CALCIUM 40 MG PO TABS
40.0000 mg | ORAL_TABLET | Freq: Every day | ORAL | 5 refills | Status: AC
Start: 2024-03-03 — End: ?

## 2024-03-03 MED ORDER — VITAMIN B-12 1000 MCG PO TABS: 1000.0000 ug | ORAL_TABLET | Freq: Every day | ORAL | Status: AC

## 2024-03-05 ENCOUNTER — Ambulatory Visit: Admitting: Adult Health

## 2024-03-05 ENCOUNTER — Other Ambulatory Visit: Payer: Self-pay

## 2024-03-11 ENCOUNTER — Telehealth: Payer: Self-pay | Admitting: Adult Health

## 2024-03-11 NOTE — Telephone Encounter (Signed)
 NPS[facility] pending.

## 2024-03-25 ENCOUNTER — Other Ambulatory Visit (HOSPITAL_COMMUNITY): Payer: Self-pay

## 2024-03-26 ENCOUNTER — Other Ambulatory Visit (HOSPITAL_COMMUNITY): Payer: Self-pay

## 2024-03-27 NOTE — Telephone Encounter (Signed)
 [facility] wants it switch to HST due to no contraindications no auth req

## 2024-03-28 ENCOUNTER — Other Ambulatory Visit: Payer: Self-pay

## 2024-03-28 ENCOUNTER — Other Ambulatory Visit: Payer: Self-pay | Admitting: Pharmacy Technician

## 2024-03-28 NOTE — Progress Notes (Signed)
 Specialty Pharmacy Refill Coordination Note  Colleen Baker is a 48 y.o. female contacted today regarding refills of specialty medication(s) Dupilumab  (Dupixent )   Patient requested Delivery   Delivery date: 04/04/24   Verified address: [address]  [place]   Medication will be filled on: 04/03/24

## 2024-03-31 ENCOUNTER — Other Ambulatory Visit: Payer: Self-pay

## 2024-03-31 NOTE — Progress Notes (Signed)
 Clinical Intervention Note  Clinical Intervention Notes: Patient reported starting B12 supplement. No DDIs identified with Dupixent .   Clinical Intervention Outcomes: Prevention of an adverse drug event   [employer] Account [employer]anner

## 2024-04-03 ENCOUNTER — Other Ambulatory Visit: Payer: Self-pay

## 2024-04-24 ENCOUNTER — Other Ambulatory Visit: Payer: Self-pay

## 2024-04-28 ENCOUNTER — Other Ambulatory Visit: Payer: Self-pay

## 2024-04-28 ENCOUNTER — Other Ambulatory Visit: Payer: Self-pay | Admitting: Pharmacy Technician

## 2024-04-28 NOTE — Progress Notes (Signed)
 Specialty Pharmacy Refill Coordination Note  Colleen Baker is a 49 y.o. female contacted today regarding refills of specialty medication(s) Dupilumab  (Dupixent )   Patient requested Delivery   Delivery date: 05/07/24   Verified address: [address] [name]   Medication will be filled on: 05/06/24

## 2024-05-05 ENCOUNTER — Other Ambulatory Visit: Payer: Self-pay

## 2024-05-06 ENCOUNTER — Other Ambulatory Visit: Payer: Self-pay

## 2024-06-02 ENCOUNTER — Ambulatory Visit: Admitting: Adult Health

## 2024-08-13 ENCOUNTER — Ambulatory Visit: Admitting: Adult Health
